# Patient Record
Sex: Male | Born: 1955 | Race: White | Hispanic: No | Marital: Married | Smoking: Former smoker
Health system: Southern US, Community
[De-identification: ages and names within clinical notes are randomized; demographics above are authoritative.]

## PROBLEM LIST (undated history)

## (undated) DIAGNOSIS — I10 Essential (primary) hypertension: Secondary | ICD-10-CM

## (undated) DIAGNOSIS — E785 Hyperlipidemia, unspecified: Secondary | ICD-10-CM

## (undated) DIAGNOSIS — R030 Elevated blood-pressure reading, without diagnosis of hypertension: Secondary | ICD-10-CM

## (undated) DIAGNOSIS — R0600 Dyspnea, unspecified: Secondary | ICD-10-CM

## (undated) DIAGNOSIS — K921 Melena: Secondary | ICD-10-CM

## (undated) DIAGNOSIS — F419 Anxiety disorder, unspecified: Secondary | ICD-10-CM

## (undated) DIAGNOSIS — R7303 Prediabetes: Secondary | ICD-10-CM

## (undated) DIAGNOSIS — U071 COVID-19: Secondary | ICD-10-CM

## (undated) DIAGNOSIS — K760 Fatty (change of) liver, not elsewhere classified: Secondary | ICD-10-CM

## (undated) DIAGNOSIS — R011 Cardiac murmur, unspecified: Secondary | ICD-10-CM

## (undated) HISTORY — DX: Fatty (change of) liver, not elsewhere classified: K76.0

## (undated) HISTORY — DX: COVID-19: U07.1

## (undated) HISTORY — DX: Hyperlipidemia, unspecified: E78.5

## (undated) HISTORY — PX: CARDIAC CATHETERIZATION: SHX172

## (undated) HISTORY — DX: Melena: K92.1

## (undated) HISTORY — DX: Cardiac murmur, unspecified: R01.1

## (undated) HISTORY — PX: OTHER SURGICAL HISTORY: SHX169

## (undated) HISTORY — DX: Elevated blood-pressure reading, without diagnosis of hypertension: R03.0

---

## 2009-07-11 ENCOUNTER — Ambulatory Visit: Payer: Self-pay | Admitting: Internal Medicine

## 2009-07-14 ENCOUNTER — Ambulatory Visit: Payer: Self-pay | Admitting: Internal Medicine

## 2009-11-21 ENCOUNTER — Ambulatory Visit: Payer: Self-pay | Admitting: Gastroenterology

## 2010-06-06 IMAGING — RF DG UGI W/O KUB
1 series · 15 of 24 positions shown · non-contrast
Comparison: No comparison

REASON FOR EXAM: abd pain  heartburn
COMMENTS:

PROCEDURE:     FL  - FL UPPER GI  - July 14, 2009 [DATE]
RESULT:     Indication: Abdominal pain

[Series 1: run · 22 acquisitions, 15 frames shown]
[im 1/22]
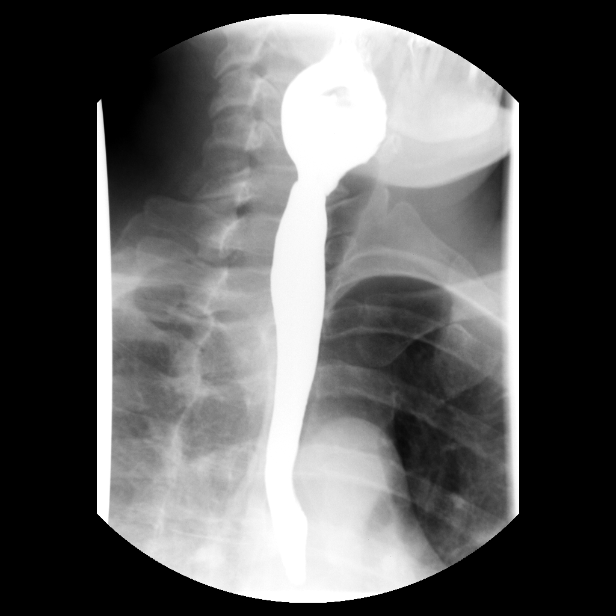
[im 2/22]
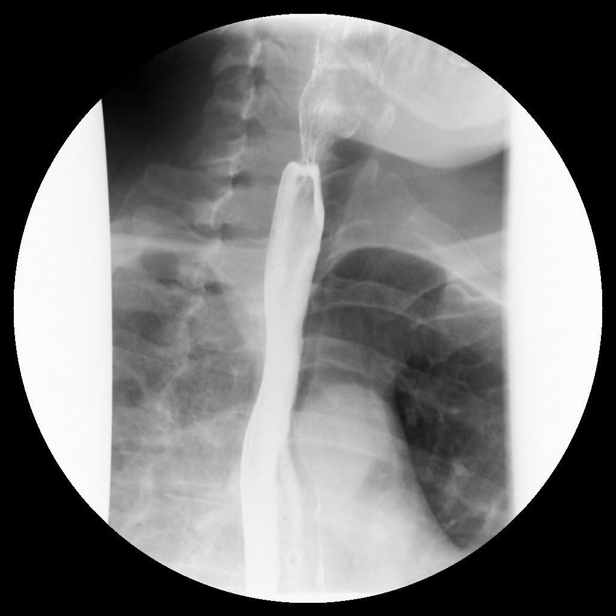
[im 2/22]
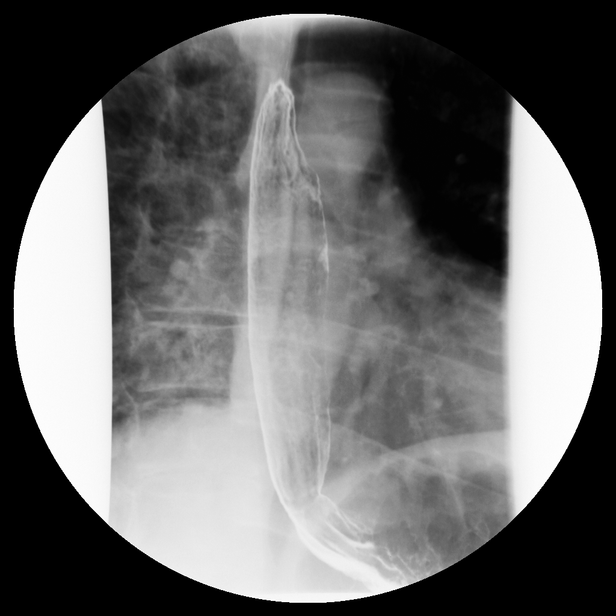
[im 2/22]
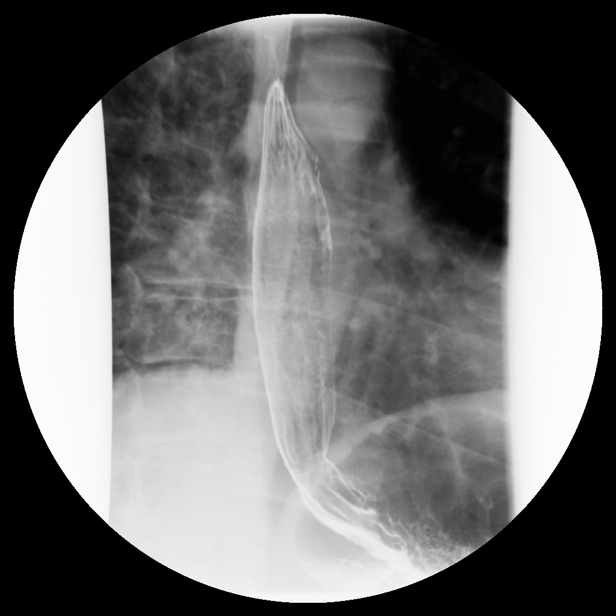
[im 5/22]
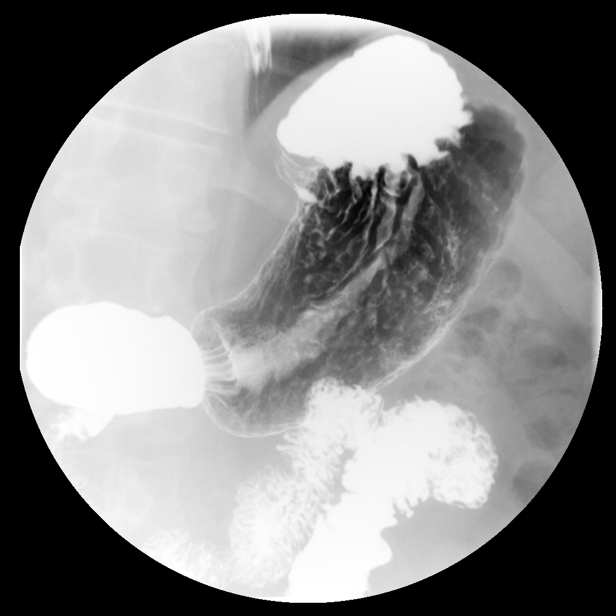
[im 6/22]
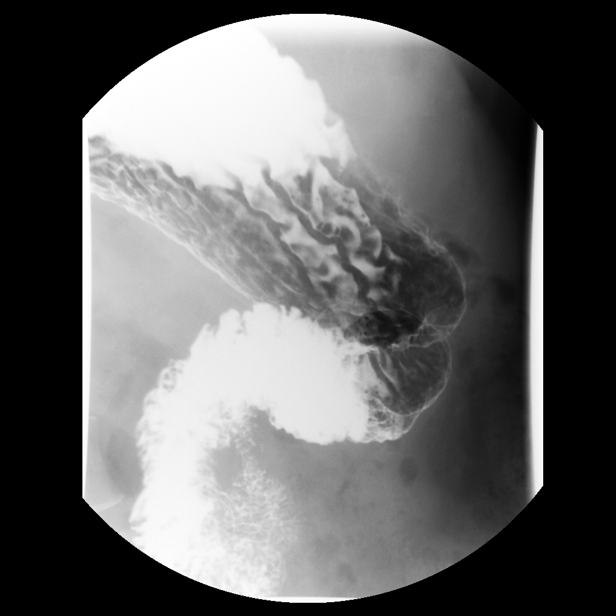
[im 9/22]
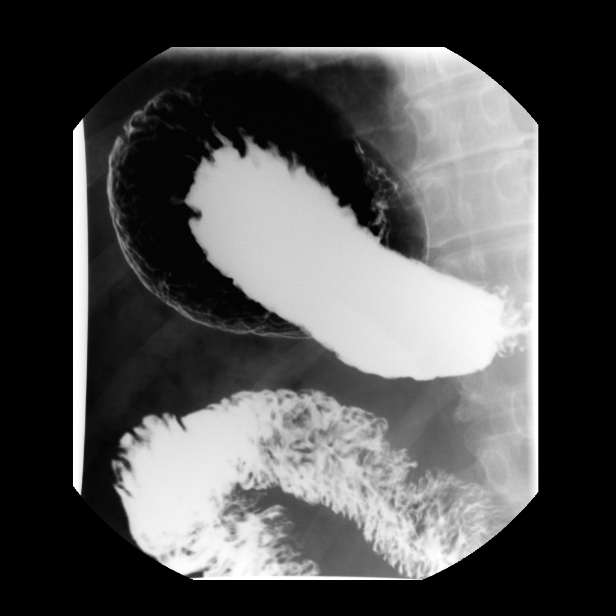
[im 11/22]
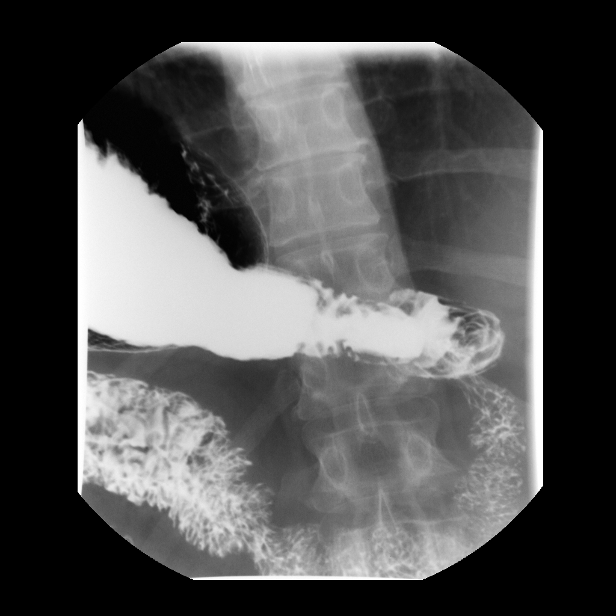
[im 12/22]
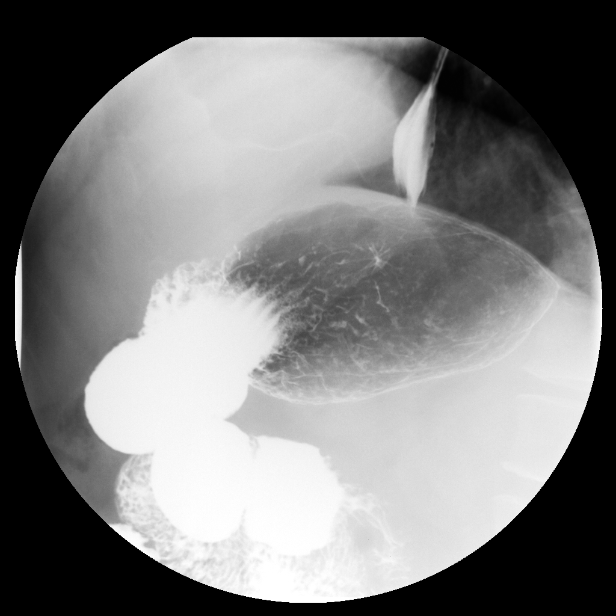
[im 15/22]
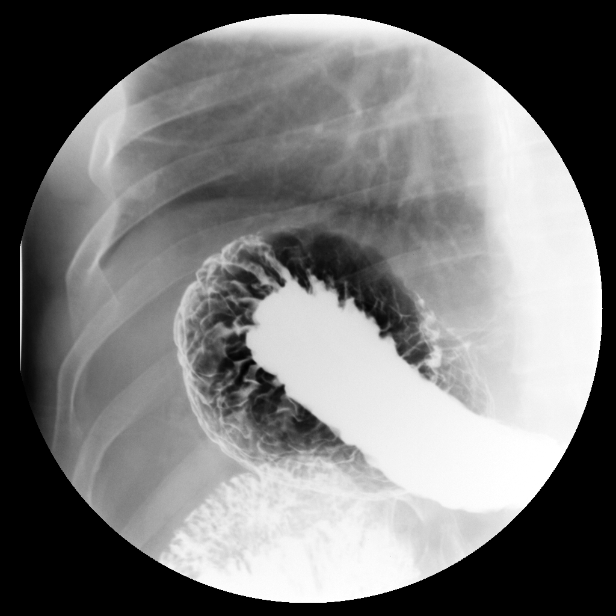
[im 16/22]
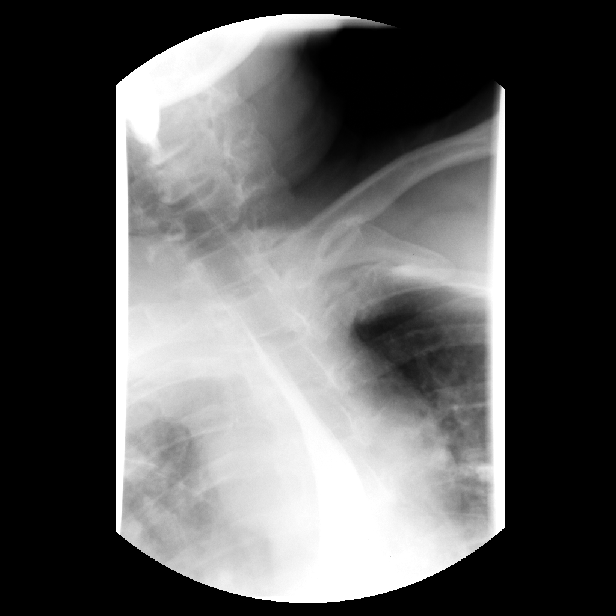
[im 16/22]
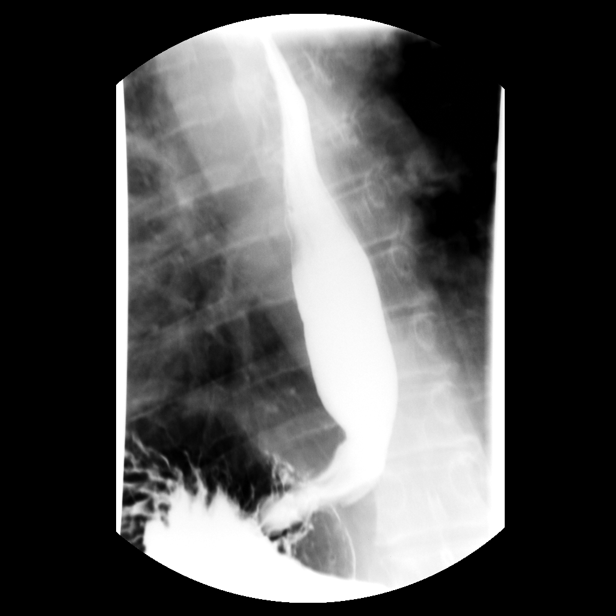
[im 18/22]
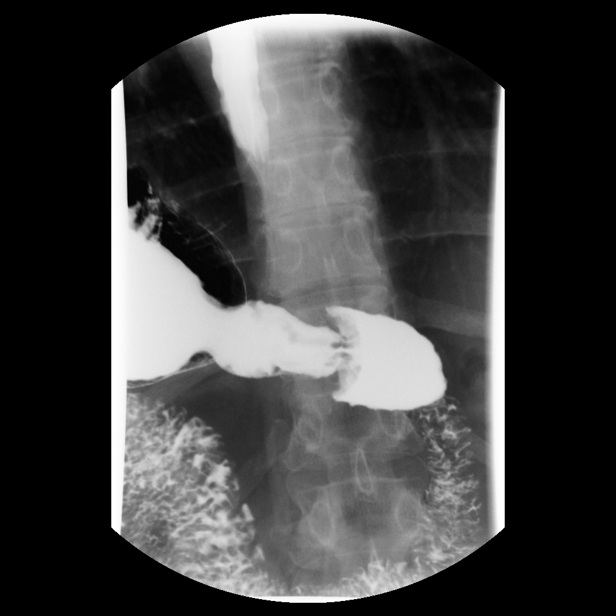
[im 19/22]
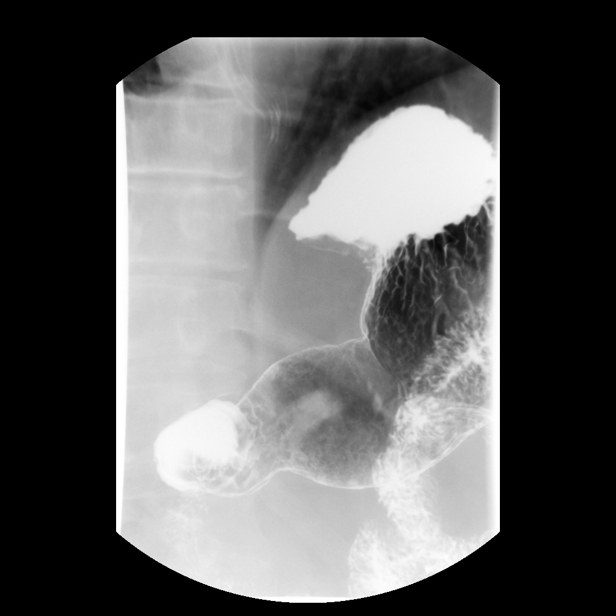
[im 22/22]
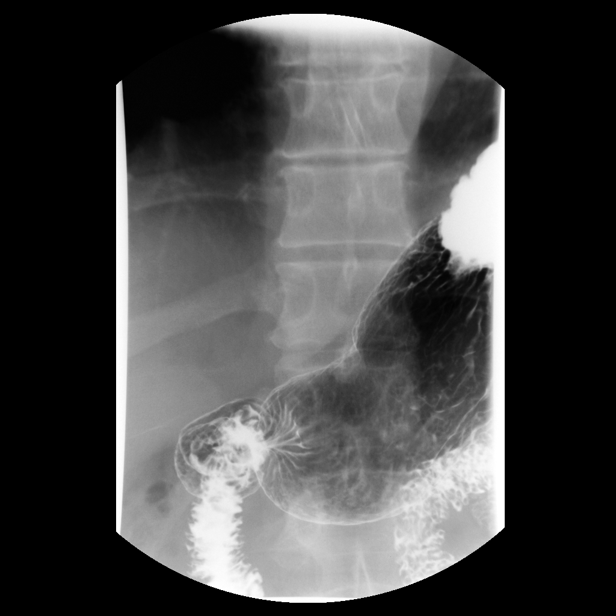

[15 of 24 positions shown; findings below may reference images not displayed]

FINDINGS: Biphasic examination of the esophagus to the distal duodenum was performed
without complication. Total fluoroscopy time was 1.7 minutes.

Examination of the esophagus demonstrated normal esophageal motility. Normal
esophageal morphology without evidence of esophagitis or ulceration. No
esophageal stricture, diverticula, or mass lesion. No evidence of hiatal
hernia. There is mild spontaneous gastroesophageal reflux.

Examination of the stomach demonstrated normal rugal folds and areae
gastricae. The gastric mucosa appeared unremarkable without evidence of
ulceration, scarring, or mass lesion. Gastric motility and emptying was
normal. Fluoroscopic examination of the duodenum demonstrates normal
motility and morphology without evidence of ulceration or mass lesion.
IMPRESSION: 1. Mild spontaneous gastroesophageal reflux.

## 2010-06-06 IMAGING — US ABDOMEN ULTRASOUND
1 series · 17 of 25 positions shown · non-contrast
Comparison: none

REASON FOR EXAM: abd pain  heartburn
COMMENTS:

[Series 1: abdomen ultrasound · 17 of 75 slices shown]
[im 1/75]
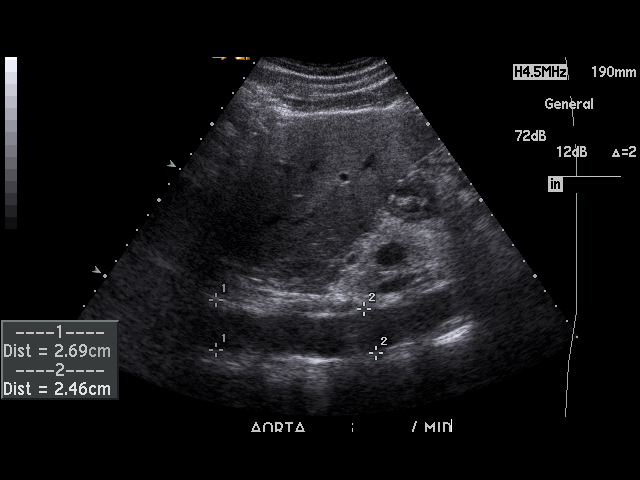
[im 7/75]
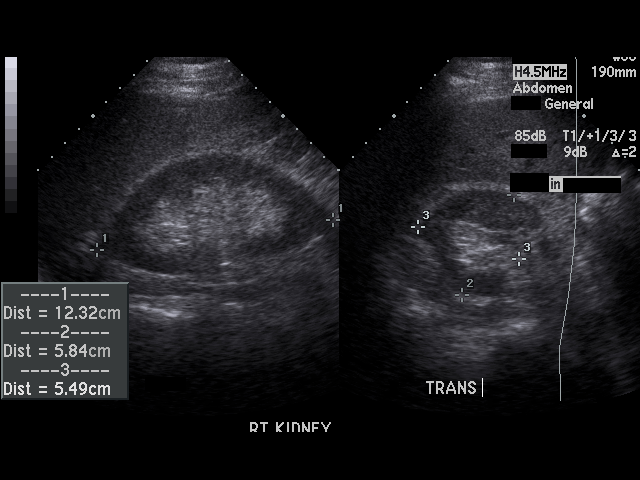
[im 10/75]
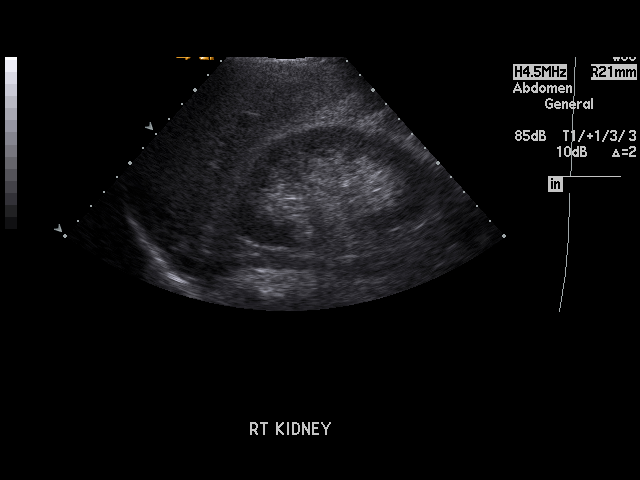
[im 16/75]
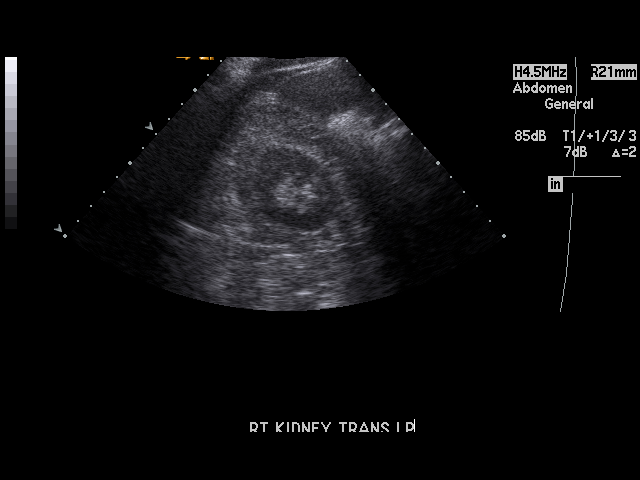
[im 19/75]
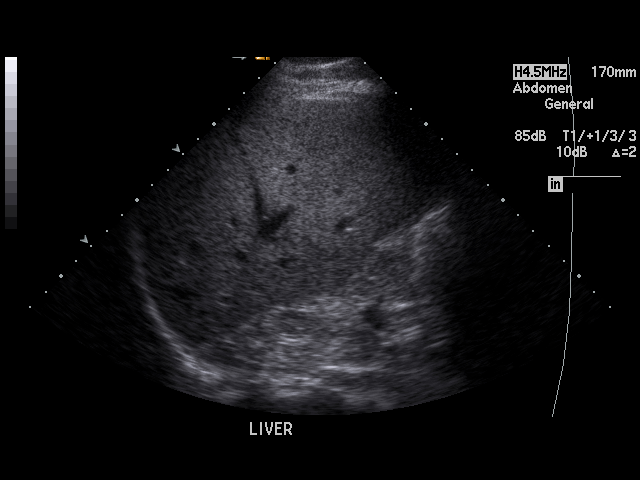
[im 25/75]
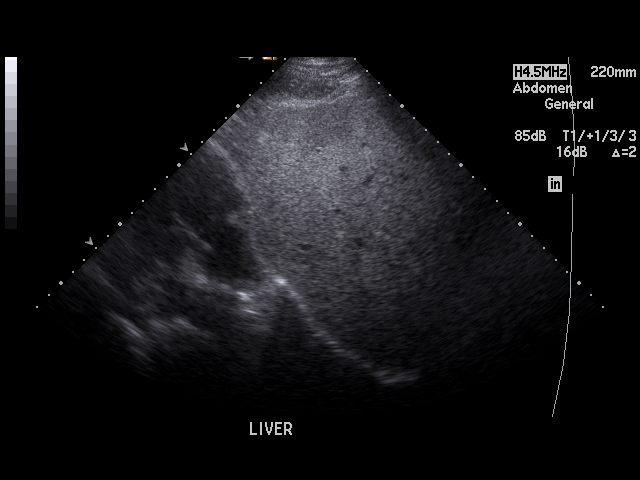
[im 28/75]
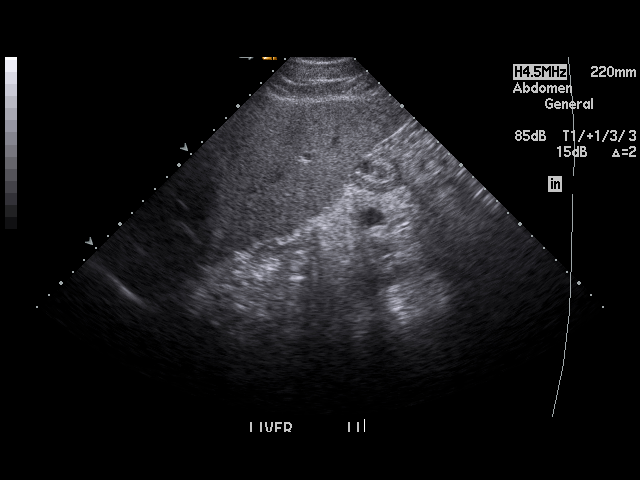
[im 34/75]
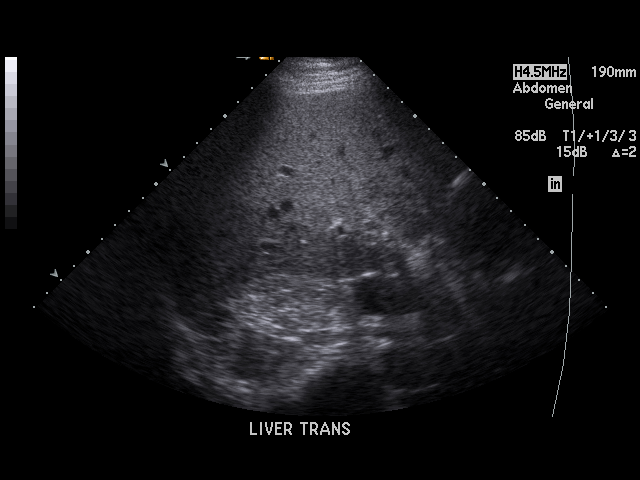
[im 38/75]
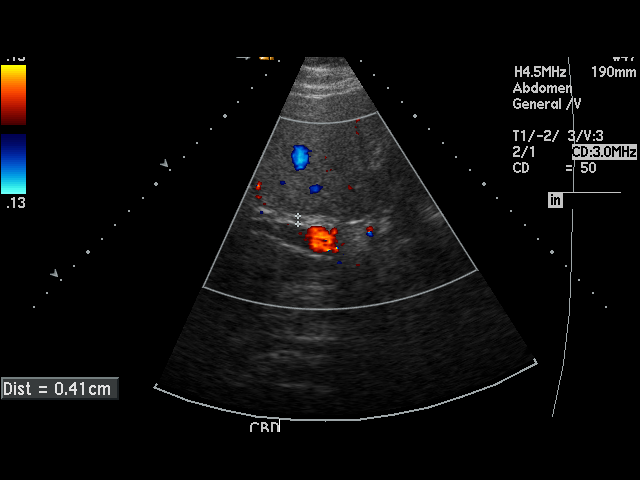
[im 41/75]
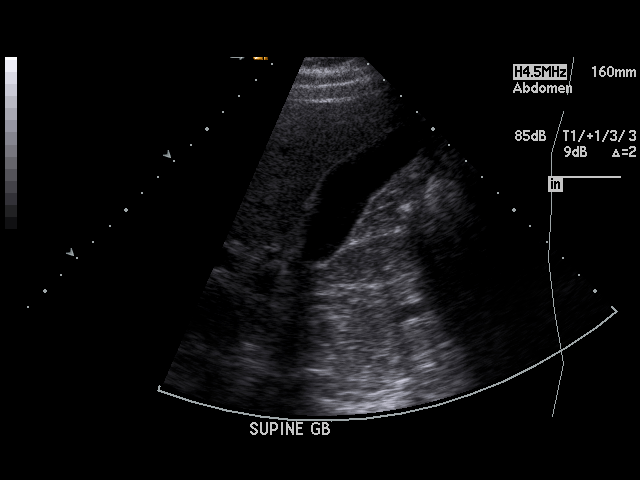
[im 47/75]
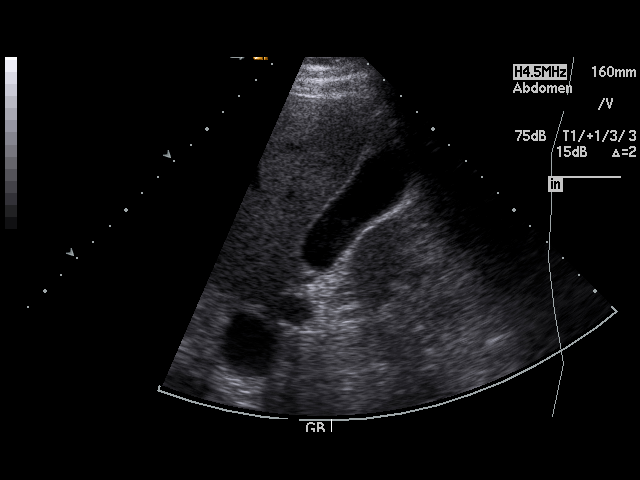
[im 50/75]
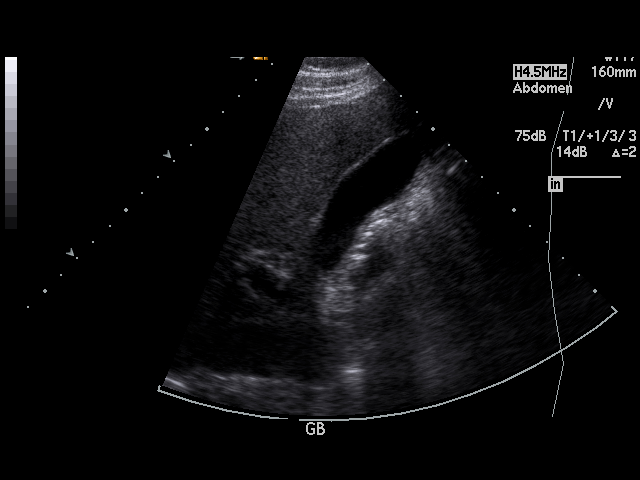
[im 56/75]
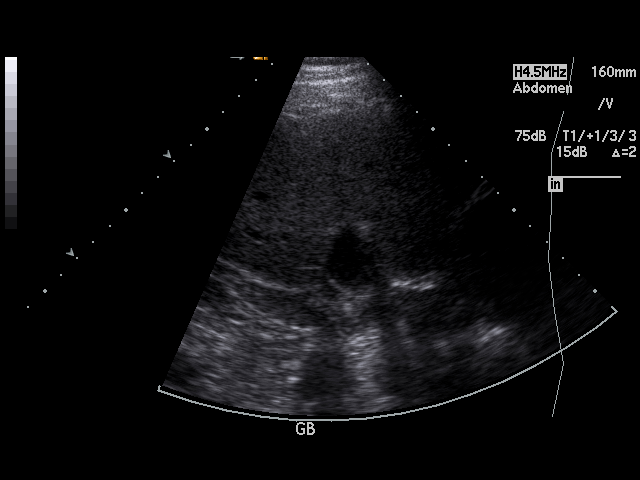
[im 59/75]
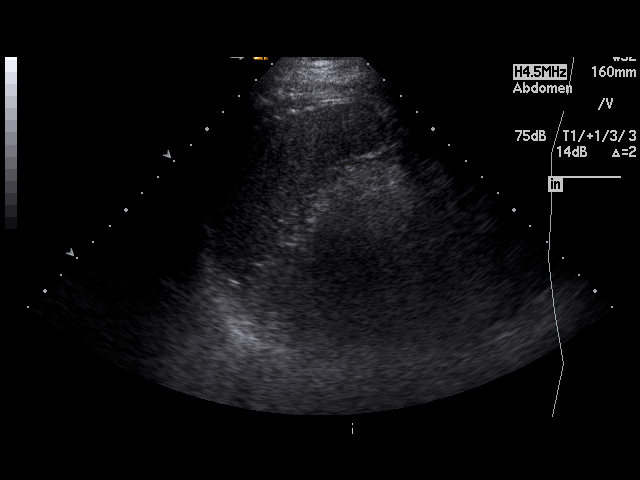
[im 65/75]
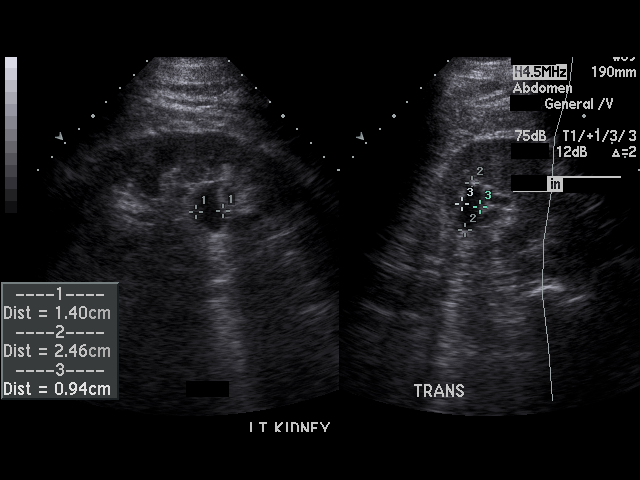
[im 68/75]
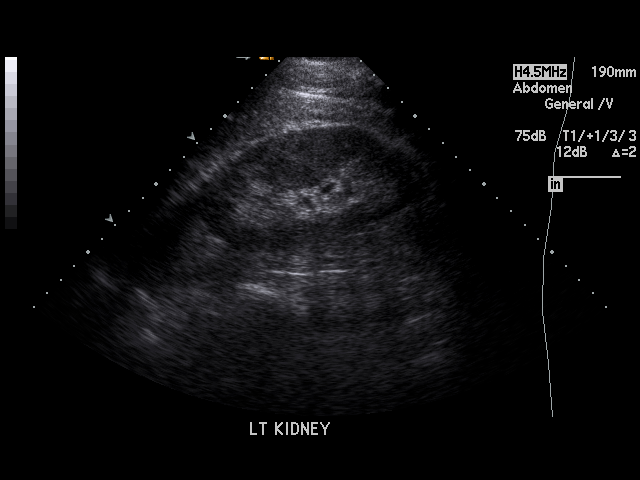
[im 75/75]
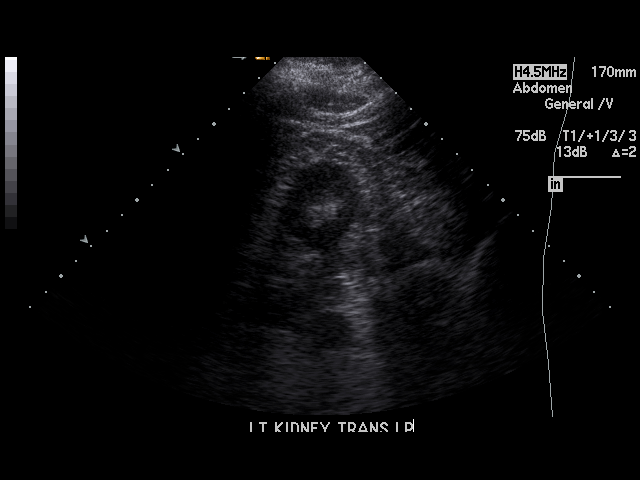

[17 of 25 positions shown; findings below may reference images not displayed]

PROCEDURE:     US  - US ABDOMEN GENERAL SURVEY  - July 14, 2009  [DATE]

RESULT:     No focal hepatic mass lesions are seen. The liver is mildly
hyperechogenic, suspicious for fatty infiltration. Spleen size is normal.
The pancreas, inferior vena cava and abdominal aorta reveal no significant
abnormalities. No gallstones are seen. There is no thickening of the
gallbladder wall. The common bile duct measures 4.1 mm in diameter which is
within normal limits. The kidneys show no hydronephrosis. There is no
ascites.
IMPRESSION: 1. The liver appears mildly hyperechogenic, suspicious for fatty
infiltration.
2. Otherwise normal study.

## 2018-07-10 ENCOUNTER — Telehealth: Payer: Self-pay

## 2018-07-10 ENCOUNTER — Ambulatory Visit (INDEPENDENT_AMBULATORY_CARE_PROVIDER_SITE_OTHER): Payer: BLUE CROSS/BLUE SHIELD | Admitting: Internal Medicine

## 2018-07-10 ENCOUNTER — Encounter: Payer: Self-pay | Admitting: Internal Medicine

## 2018-07-10 ENCOUNTER — Ambulatory Visit
Admission: RE | Admit: 2018-07-10 | Discharge: 2018-07-10 | Disposition: A | Discharge status: Home / Self Care | Payer: BLUE CROSS/BLUE SHIELD | Source: Ambulatory Visit | Attending: Internal Medicine | Admitting: Internal Medicine

## 2018-07-10 VITALS — BP 138/100 | HR 89 | Temp 98.2°F | Resp 18 | Ht 69.5 in | Wt 211.5 lb

## 2018-07-10 DIAGNOSIS — I700 Atherosclerosis of aorta: Secondary | ICD-10-CM | POA: Insufficient documentation

## 2018-07-10 DIAGNOSIS — R1013 Epigastric pain: Secondary | ICD-10-CM | POA: Insufficient documentation

## 2018-07-10 DIAGNOSIS — Z125 Encounter for screening for malignant neoplasm of prostate: Secondary | ICD-10-CM | POA: Diagnosis not present

## 2018-07-10 DIAGNOSIS — E559 Vitamin D deficiency, unspecified: Secondary | ICD-10-CM

## 2018-07-10 DIAGNOSIS — K760 Fatty (change of) liver, not elsewhere classified: Secondary | ICD-10-CM

## 2018-07-10 DIAGNOSIS — R1033 Periumbilical pain: Secondary | ICD-10-CM | POA: Insufficient documentation

## 2018-07-10 DIAGNOSIS — K429 Umbilical hernia without obstruction or gangrene: Secondary | ICD-10-CM | POA: Insufficient documentation

## 2018-07-10 DIAGNOSIS — Z1329 Encounter for screening for other suspected endocrine disorder: Secondary | ICD-10-CM | POA: Diagnosis not present

## 2018-07-10 DIAGNOSIS — R011 Cardiac murmur, unspecified: Secondary | ICD-10-CM

## 2018-07-10 DIAGNOSIS — E785 Hyperlipidemia, unspecified: Secondary | ICD-10-CM | POA: Insufficient documentation

## 2018-07-10 HISTORY — DX: Essential (primary) hypertension: I10

## 2018-07-10 LAB — CBC WITH DIFFERENTIAL/PLATELET
BASOS PCT: 0.5 % (ref 0.0–3.0)
Basophils Absolute: 0 10*3/uL (ref 0.0–0.1)
EOS ABS: 0.2 10*3/uL (ref 0.0–0.7)
EOS PCT: 4.1 % (ref 0.0–5.0)
HEMATOCRIT: 48.5 % (ref 39.0–52.0)
HEMOGLOBIN: 16.5 g/dL (ref 13.0–17.0)
LYMPHS PCT: 45.4 % (ref 12.0–46.0)
Lymphs Abs: 2.6 10*3/uL (ref 0.7–4.0)
MCHC: 34.1 g/dL (ref 30.0–36.0)
MCV: 89.6 fl (ref 78.0–100.0)
Monocytes Absolute: 0.6 10*3/uL (ref 0.1–1.0)
Monocytes Relative: 9.7 % (ref 3.0–12.0)
NEUTROS ABS: 2.3 10*3/uL (ref 1.4–7.7)
Neutrophils Relative %: 40.3 % — ABNORMAL LOW (ref 43.0–77.0)
PLATELETS: 240 10*3/uL (ref 150.0–400.0)
RBC: 5.41 Mil/uL (ref 4.22–5.81)
RDW: 13.6 % (ref 11.5–15.5)
WBC: 5.8 10*3/uL (ref 4.0–10.5)

## 2018-07-10 LAB — COMPREHENSIVE METABOLIC PANEL
ALT: 25 U/L (ref 0–53)
AST: 17 U/L (ref 0–37)
Albumin: 4.5 g/dL (ref 3.5–5.2)
Alkaline Phosphatase: 59 U/L (ref 39–117)
BUN: 26 mg/dL — ABNORMAL HIGH (ref 6–23)
CALCIUM: 9.7 mg/dL (ref 8.4–10.5)
CO2: 31 meq/L (ref 19–32)
CREATININE: 1.11 mg/dL (ref 0.40–1.50)
Chloride: 101 mEq/L (ref 96–112)
GFR: 71.67 mL/min (ref >60.00)
Glucose, Bld: 102 mg/dL — ABNORMAL HIGH (ref 70–99)
POTASSIUM: 4.3 meq/L (ref 3.5–5.1)
SODIUM: 138 meq/L (ref 135–145)
Total Bilirubin: 0.4 mg/dL (ref 0.2–1.2)
Total Protein: 7.2 g/dL (ref 6.0–8.3)

## 2018-07-10 LAB — TSH: TSH: 0.99 u[IU]/mL (ref 0.35–4.50)

## 2018-07-10 LAB — VITAMIN D 25 HYDROXY (VIT D DEFICIENCY, FRACTURES): VITD: 26.3 ng/mL — ABNORMAL LOW (ref 30.00–100.00)

## 2018-07-10 LAB — PSA: PSA: 1.22 ng/mL (ref 0.10–4.00)

## 2018-07-10 MED ORDER — IOHEXOL 300 MG/ML  SOLN
100.0000 mL | Freq: Once | INTRAMUSCULAR | Status: AC | PRN
  Administered 2018-07-10: 100 mL via INTRAVENOUS

## 2018-07-10 NOTE — Patient Instructions (Addendum)
Umbilical Hernia, Adult A hernia is a bulge of tissue that pushes through an opening between muscles. An umbilical hernia happens in the abdomen, near the belly button (umbilicus). The hernia may contain tissues from the small intestine, large intestine, or fatty tissue covering the intestines (omentum). Umbilical hernias in adults tend to get worse over time, and they require surgical treatment. There are several types of umbilical hernias. You may have:  A hernia located just above or below the umbilicus (indirect hernia). This is the most common type of umbilical hernia in adults.  A hernia that forms through an opening formed by the umbilicus (direct hernia).  A hernia that comes and goes (reducible hernia). A reducible hernia may be visible only when you strain, lift something heavy, or cough. This type of hernia can be pushed back into the abdomen (reduced).  A hernia that traps abdominal tissue inside the hernia (incarcerated hernia). This type of hernia cannot be reduced.  A hernia that cuts off blood flow to the tissues inside the hernia (strangulated hernia). The tissues can start to die if this happens. This type of hernia requires emergency treatment.  What are the causes? An umbilical hernia happens when tissue inside the abdomen presses on a weak area of the abdominal muscles. What increases the risk? You may have a greater risk of this condition if you:  Are obese.  Have had several pregnancies.  Have a buildup of fluid inside your abdomen (ascites).  Have had surgery that weakens the abdominal muscles.  What are the signs or symptoms? The main symptom of this condition is a painless bulge at or near the belly button. A reducible hernia may be visible only when you strain, lift something heavy, or cough. Other symptoms may include:  Dull pain.  A feeling of pressure.  Symptoms of a strangulated hernia may include:  Pain that gets increasingly worse.  Nausea and  vomiting.  Pain when pressing on the hernia.  Skin over the hernia becoming red or purple.  Constipation.  Blood in the stool.  How is this diagnosed? This condition may be diagnosed based on:  A physical exam. You may be asked to cough or strain while standing. These actions increase the pressure inside your abdomen and force the hernia through the opening in your muscles. Your health care provider may try to reduce the hernia by pressing on it.  Your symptoms and medical history.  How is this treated? Surgery is the only treatment for an umbilical hernia. Surgery for a strangulated hernia is done as soon as possible. If you have a small hernia that is not incarcerated, you may need to lose weight before having surgery. Follow these instructions at home:  Lose weight, if told by your health care provider.  Do not try to push the hernia back in.  Watch your hernia for any changes in color or size. Tell your health care provider if any changes occur.  You may need to avoid activities that increase pressure on your hernia.  Do not lift anything that is heavier than 10 lb (4.5 kg) until your health care provider says that this is safe.  Take over-the-counter and prescription medicines only as told by your health care provider.  Keep all follow-up visits as told by your health care provider. This is important. Contact a health care provider if:  Your hernia gets larger.  Your hernia becomes painful. Get help right away if:  You develop sudden, severe pain near the  area of your hernia.  You have pain as well as nausea or vomiting.  You have pain and the skin over your hernia changes color.  You develop a fever. This information is not intended to replace advice given to you by your health care provider. Make sure you discuss any questions you have with your health care provider. Document Released: 01/20/2016 Document Revised: 04/22/2016 Document Reviewed:  01/20/2016 Elsevier Interactive Patient Education  2018 [employer].  Tdap/DTaP Vaccine (Diphtheria, Tetanus, and Pertussis): What You Need to Know 1. Why get vaccinated? Diphtheria, tetanus, and pertussis are serious diseases caused by bacteria. Diphtheria and pertussis are spread from person to person. Tetanus enters the body through cuts or wounds. DIPHTHERIA causes a thick covering in the back of the throat.  It can lead to breathing problems, paralysis, heart failure, and even death.  TETANUS (Lockjaw) causes painful tightening of the muscles, usually all over the body.  It can lead to "locking" of the jaw so the victim cannot open his mouth or swallow. Tetanus leads to death in up to 2 out of 10 cases.  PERTUSSIS (Whooping Cough) causes coughing spells so bad that it is hard for infants to eat, drink, or breathe. These spells can last for weeks.  It can lead to pneumonia, seizures (jerking and staring spells), brain damage, and death.  Diphtheria, tetanus, and pertussis vaccine (DTaP) can help prevent these diseases. Most children who are vaccinated with DTaP will be protected throughout childhood. Many more children would get these diseases if we stopped vaccinating. DTaP is a safer version of an older vaccine called DTP. DTP is no longer used in the [place]. 2. Who should get DTaP vaccine and when? Children should get 5 doses of DTaP vaccine, one dose at each of the following ages:  2 months  4 months  6 months  15-18 months  4-6 years  DTaP may be given at the same time as other vaccines. 3. Some children should not get DTaP vaccine or should wait  Children with minor illnesses, such as a cold, may be vaccinated. But children who are moderately or severely ill should usually wait until they recover before getting DTaP vaccine.  Any child who had a life-threatening allergic reaction after a dose of DTaP should not get another dose.  Any child who suffered a  brain or nervous system disease within 7 days after a dose of DTaP should not get another dose.  Talk with your doctor if your child: ? had a seizure or collapsed after a dose of DTaP, ? cried non-stop for 3 hours or more after a dose of DTaP, ? had a fever over 105F after a dose of DTaP. Ask your doctor for more information. Some of these children should not get another dose of pertussis vaccine, but may get a vaccine without pertussis, called DT. 4. Older children and adults DTaP is not licensed for adolescents, adults, or children 21 years of age and older. But older people still need protection. A vaccine called Tdap is similar to DTaP. A single dose of Tdap is recommended for people 11 through 60 years of age. Another vaccine, called Td, protects against tetanus and diphtheria, but not pertussis. It is recommended every 10 years. There are separate Vaccine Information Statements for these vaccines. 5. What are the risks from DTaP vaccine? Getting diphtheria, tetanus, or pertussis disease is much riskier than getting DTaP vaccine. However, a vaccine, like any medicine, is capable of causing serious problems,  such as severe allergic reactions. The risk of DTaP vaccine causing serious harm, or death, is extremely small. Mild problems (common)  Fever (up to about 1 child in 4)  Redness or swelling where the shot was given (up to about 1 child in 4)  Soreness or tenderness where the shot was given (up to about 1 child in 4) These problems occur more often after the 4th and 5th doses of the DTaP series than after earlier doses. Sometimes the 4th or 5th dose of DTaP vaccine is followed by swelling of the entire arm or leg in which the shot was given, lasting 1-7 days (up to about 1 child in 30). Other mild problems include:  Fussiness (up to about 1 child in 3)  Tiredness or poor appetite (up to about 1 child in 10)  Vomiting (up to about 1 child in 50) These problems generally occur 1-3  days after the shot. Moderate problems (uncommon)  Seizure (jerking or staring) (about 1 child out of 14,000)  Non-stop crying, for 3 hours or more (up to about 1 child out of 1,000)  High fever, over 105F (about 1 child out of 16,000) Severe problems (very rare)  Serious allergic reaction (less than 1 out of a million doses)  Several other severe problems have been reported after DTaP vaccine. These include: ? Long-term seizures, coma, or lowered consciousness ? Permanent brain damage. These are so rare it is hard to tell if they are caused by the vaccine. Controlling fever is especially important for children who have had seizures, for any reason. It is also important if another family member has had seizures. You can reduce fever and pain by giving your child an aspirin-free pain reliever when the shot is given, and for the next 24 hours, following the package instructions. 6. What if there is a serious reaction? What should I look for? Look for anything that concerns you, such as signs of a severe allergic reaction, very high fever, or behavior changes. Signs of a severe allergic reaction can include hives, swelling of the face and throat, difficulty breathing, a fast heartbeat, dizziness, and weakness. These would start a few minutes to a few hours after the vaccination. What should I do?  If you think it is a severe allergic reaction or other emergency that can't wait, call 9-1-1 or get the person to the nearest hospital. Otherwise, call your doctor.  Afterward, the reaction should be reported to the Vaccine Adverse Event Reporting System (VAERS). Your doctor might file this report, or you can do it yourself through the VAERS web site at www.vaers.LAgents.no, or by calling 1-(650) 303-1194. ? VAERS is only for reporting reactions. They do not give medical advice. 7. The National Vaccine Injury Compensation Program The [employer] Vaccine Injury Compensation Program (VICP) is a federal  program that was created to compensate people who may have been injured by certain vaccines. Persons who believe they may have been injured by a vaccine can learn about the program and about filing a claim by calling 1-301-729-9318 or visiting the VICP website at SpiritualWord.at. 8. How can I learn more?  Ask your doctor.  Call your local or state health department.  Contact the Centers for Disease Control and Prevention (CDC): ? Call 5616628488 (1-800-CDC-INFO) or ? Visit CDC's website at PicCapture.uy CDC DTaP Vaccine (Diphtheria, Tetanus, and Pertussis) VIS (01/17/06) This information is not intended to replace advice given to you by your health care provider. Make sure you discuss any questions you  have with your health care provider. Document Released: 06/17/2006 Document Revised: 05/10/2016 Document Reviewed: 05/10/2016 Elsevier Interactive Patient Education  2017 Elsevier Inc.   Nonalcoholic Fatty Liver Disease Diet Nonalcoholic fatty liver disease is a condition that causes fat to accumulate in and around the liver. The disease makes it harder for the liver to work the way that it should. Following a healthy diet can help to keep nonalcoholic fatty liver disease under control. It can also help to prevent or improve conditions that are associated with the disease, such as heart disease, diabetes, high blood pressure, and abnormal cholesterol levels. Along with regular exercise, this diet:  Promotes weight loss.  Helps to control blood sugar levels.  Helps to improve the way that the body uses insulin.  What do I need to know about this diet?  Use the glycemic index (GI) to plan your meals. The index tells you how quickly a food will raise your blood sugar. Choose low-GI foods. These foods take a longer time to raise blood sugar.  Keep track of how many calories you take in. Eating the right amount of calories will help you to achieve a healthy  weight.  You may want to follow a Mediterranean diet. This diet includes a lot of vegetables, lean meats or fish, whole grains, fruits, and healthy oils and fats. What foods can I eat? Grains Whole grains, such as whole-wheat or whole-grain breads, crackers, tortillas, cereals, and pasta. Stone-ground whole wheat. Pumpernickel bread. Unsweetened oatmeal. Bulgur. Barley. Quinoa. Brown or wild rice. Corn or whole-wheat flour tortillas. Vegetables Lettuce. Spinach. Peas. Beets. Cauliflower. Cabbage. Broccoli. Carrots. Tomatoes. Squash. Eggplant. Herbs. Peppers. Onions. Cucumbers. Brussels sprouts. Yams and sweet potatoes. Beans. Lentils. Fruits Bananas. Apples. Oranges. Grapes. Papaya. Mango. Pomegranate. Kiwi. Grapefruit. Cherries. Meats and Other Protein Sources Seafood and shellfish. Lean meats. Poultry. Tofu. Dairy Low-fat or fat-free dairy products, such as yogurt, cottage cheese, and cheese. Beverages Water. Sugar-free drinks. Tea. Coffee. Low-fat or skim milk. Milk alternatives, such as soy or almond milk. Real fruit juice. Condiments Mustard. Relish. Low-fat, low-sugar ketchup and barbecue sauce. Low-fat or fat-free mayonnaise. Sweets and Desserts Sugar-free sweets. Fats and Oils Avocado. Canola or olive oil. Nuts and nut butters. Seeds. The items listed above may not be a complete list of recommended foods or beverages. Contact your dietitian for more options. What foods are not recommended? Palm oil and coconut oil. Processed foods. Fried foods. Sweetened drinks, such as sweet tea, milkshakes, snow cones, iced sweet drinks, and sodas. Alcohol. Sweets. Foods that contain a lot of salt or sodium. The items listed above may not be a complete list of foods and beverages to avoid. Contact your dietitian for more information. This information is not intended to replace advice given to you by your health care provider. Make sure you discuss any questions you have with your health care  provider. Document Released: 01/04/2015 Document Revised: 01/26/2016 Document Reviewed: 09/14/2014 Elsevier Interactive Patient Education  2018 Elsevier Inc.  Fatty Liver Fatty liver, also called hepatic steatosis or steatohepatitis, is a condition in which too much fat has built up in your liver cells. The liver removes harmful substances from your bloodstream. It produces fluids your body needs. It also helps your body use and store energy from the food you eat. In many cases, fatty liver does not cause symptoms or problems. It is often diagnosed when tests are being done for other reasons. However, over time, fatty liver can cause inflammation that may lead to more serious  liver problems, such as scarring of the liver (cirrhosis). What are the causes? Causes of fatty liver may include:  Drinking too much alcohol.  Poor nutrition.  Obesity.  Cushing syndrome.  Diabetes.  Hyperlipidemia.  Pregnancy.  Certain drugs.  Poisons.  Some viral infections.  What increases the risk? You may be more likely to develop fatty liver if you:  Abuse alcohol.  Are pregnant.  Are overweight.  Have diabetes.  Have hepatitis.  Have a high triglyceride level.  What are the signs or symptoms? Fatty liver often does not cause any symptoms. In cases where symptoms develop, they can include:  Fatigue.  Weakness.  Weight loss.  Confusion.  Abdominal pain.  Yellowing of your skin and the white parts of your eyes (jaundice).  Nausea and vomiting.  How is this diagnosed? Fatty liver may be diagnosed by:  Physical exam and medical history.  Blood tests.  Imaging tests, such as an ultrasound, CT scan, or MRI.  Liver biopsy. A small sample of liver tissue is removed using a needle. The sample is then looked at under a microscope.  How is this treated? Fatty liver is often caused by other health conditions. Treatment for fatty liver may involve medicines and lifestyle  changes to manage conditions such as:  Alcoholism.  High cholesterol.  Diabetes.  Being overweight or obese.  Follow these instructions at home:  Eat a healthy diet as directed by your health care provider.  Exercise regularly. This can help you lose weight and control your cholesterol and diabetes. Talk to your health care provider about an exercise plan and which activities are best for you.  Do not drink alcohol.  Take medicines only as directed by your health care provider. Contact a health care provider if: You have difficulty controlling your:  Blood sugar.  Cholesterol.  Alcohol consumption.  Get help right away if:  You have abdominal pain.  You have jaundice.  You have nausea and vomiting. This information is not intended to replace advice given to you by your health care provider. Make sure you discuss any questions you have with your health care provider. Document Released: 10/05/2005 Document Revised: 01/26/2016 Document Reviewed: 12/30/2013 Elsevier Interactive Patient Education  [employer].

## 2018-07-10 NOTE — Progress Notes (Addendum)
Chief Complaint  Patient presents with  . Establish Care   New patient  1. HLD unable to tolerate statins due to MSK pain not seen MD since 2010  2. C/o abdominal pain 7/10 at umbilical region x 4 months last episode Tues before visit he lies down and helps. Also c/o x 5 years blood in stool pain lasted 2-3 hrs nothing tried pain was extreme and belly felt tight and increased saliva w/o vomiting, no constipation or diarrhea   Review of Systems  Constitutional: Negative for weight loss.  HENT: Negative for hearing loss.   Eyes: Negative for blurred vision.  Respiratory: Negative for shortness of breath.   Cardiovascular: Negative for chest pain.  Gastrointestinal: Positive for abdominal pain and blood in stool. Negative for nausea and vomiting.  Musculoskeletal: Negative for falls.  Skin: Negative for rash.  Neurological: Negative for headaches.  Psychiatric/Behavioral: Negative for depression.   Past Medical History:  Diagnosis Date  . Blood in stool    on and off since 2014   . Elevated blood pressure reading   . Fatty liver   . Heart murmur   . Hyperlipidemia    Past Surgical History:  Procedure Laterality Date  . left knee surgery     Family History  Family history unknown: Yes   Social History   Socioeconomic History  . Marital status: Married    Spouse name: Not on file  . Number of children: Not on file  . Years of education: Not on file  . Highest education level: Not on file  Occupational History  . Not on file  Social Needs  . Financial resource strain: Not on file  . Food insecurity:    Worry: Not on file    Inability: Not on file  . Transportation needs:    Medical: Not on file    Non-medical: Not on file  Tobacco Use  . Smoking status: Former Games developer  . Smokeless tobacco: Never Used  . Tobacco comment: light   Substance and Sexual Activity  . Alcohol use: Yes    Comment: occ  . Drug use: Not Currently  . Sexual activity: Yes  Lifestyle  .  Physical activity:    Days per week: Not on file    Minutes per session: Not on file  . Stress: Not on file  Relationships  . Social connections:    Talks on phone: Not on file    Gets together: Not on file    Attends religious service: Not on file    Active member of club or organization: Not on file    Attends meetings of clubs or organizations: Not on file    Relationship status: Not on file  . Intimate partner violence:    Fear of current or ex partner: Not on file    Emotionally abused: Not on file    Physically abused: Not on file    Forced sexual activity: Not on file  Other Topics Concern  . Not on file  Social History Narrative   Married self employed Tree surgeon    Former smoker in 20s-30s light    No guns    Wears seat belt, safe in relationship   College ed, 2 kids    No outpatient medications have been marked as taking for the 07/10/18 encounter (Office Visit) with McLean-Scocuzza, Pasty Spillers, MD.   Allergies  Allergen Reactions  . Statins Other (See Comments)   No results found for this or any previous visit (from  the past 2160 hour(s)). Objective  Body mass index is 30.79 kg/m. Wt Readings from Last 3 Encounters:  07/10/18 211 lb 8 oz (95.9 kg)   Temp Readings from Last 3 Encounters:  07/10/18 98.2 F (36.8 C) (Oral)   BP Readings from Last 3 Encounters:  07/10/18 (!) 138/100   Pulse Readings from Last 3 Encounters:  07/10/18 89    Physical Exam  Constitutional: He is oriented to person, place, and time. Vital signs are normal. He appears well-developed and well-nourished. He is cooperative.  HENT:  Head: Normocephalic and atraumatic.  Mouth/Throat: Oropharynx is clear and moist and mucous membranes are normal.  Eyes: Pupils are equal, round, and reactive to light. Conjunctivae are normal.  Cardiovascular: Normal rate and regular rhythm.  Murmur heard. Pulmonary/Chest: Effort normal and breath sounds normal.  Abdominal: There is no tenderness. A hernia  is present.    Neurological: He is alert and oriented to person, place, and time. Gait normal.  Skin: Skin is warm, dry and intact.  Psychiatric: He has a normal mood and affect. His speech is normal and behavior is normal. Judgment and thought content normal. Cognition and memory are normal.  Nursing note and vitals reviewed.   Assessment   1. HLD  2. Ab pain umbilical with hernia h/o fatty liver  3. HM 4. Cardiac murmur  Plan   1. Check lipid in future  2. CT ab/pelvis  Labs  3.  Declines flu shot  rec Tdap vaccine  Disc shingrix at f/u   Check PSA Will likely need repeat colonoscopy get records [facility], labs stress test, notes Elevated BP today monitor recheck at f/u  4. Consider repeat echo   [facility] no pt by this name and dob received   Provider: Dr. French Ana McLean-Scocuzza-[facility]

## 2018-07-11 ENCOUNTER — Other Ambulatory Visit: Payer: Self-pay | Admitting: Internal Medicine

## 2018-07-11 ENCOUNTER — Telehealth: Payer: Self-pay

## 2018-07-11 DIAGNOSIS — K429 Umbilical hernia without obstruction or gangrene: Secondary | ICD-10-CM

## 2018-07-11 LAB — URINALYSIS, ROUTINE W REFLEX MICROSCOPIC
Bilirubin Urine: NEGATIVE
Glucose, UA: NEGATIVE
Hgb urine dipstick: NEGATIVE
KETONES UR: NEGATIVE
LEUKOCYTES UA: NEGATIVE
NITRITE: NEGATIVE
PH: 7.5 (ref 5.0–8.0)
Protein, ur: NEGATIVE
SPECIFIC GRAVITY, URINE: 1.02 (ref 1.001–1.03)

## 2018-07-11 LAB — HEPATITIS B SURFACE ANTIGEN: Hepatitis B Surface Ag: NONREACTIVE

## 2018-07-11 LAB — HEPATITIS C ANTIBODY
Hepatitis C Ab: NONREACTIVE
SIGNAL TO CUT-OFF: 0.04 (ref <1.00)

## 2018-07-11 LAB — HEPATITIS B SURFACE ANTIBODY, QUANTITATIVE

## 2018-07-11 LAB — HEPATITIS A ANTIBODY, TOTAL: Hepatitis A AB,Total: NONREACTIVE

## 2018-07-11 NOTE — Telephone Encounter (Signed)
Copied from CRM [id number]. Topic: General - Other >> Jul 11, 2018  8:42 AM [name] wrote: Reason for CRM: Pt called in wanting to know if someone could reach out to him regarding his results from his CT scan.  Please advise  *I know that [name], CMA has already talked to patient, but patient has requested that she call to inform his wife.*

## 2018-07-11 NOTE — Telephone Encounter (Signed)
Called and spoke with wife in regrards to CT results .

## 2018-07-11 NOTE — Telephone Encounter (Signed)
Error

## 2018-07-17 ENCOUNTER — Ambulatory Visit (INDEPENDENT_AMBULATORY_CARE_PROVIDER_SITE_OTHER): Payer: BLUE CROSS/BLUE SHIELD | Admitting: General Surgery

## 2018-07-17 ENCOUNTER — Other Ambulatory Visit: Payer: Self-pay

## 2018-07-17 ENCOUNTER — Encounter: Payer: Self-pay | Admitting: General Surgery

## 2018-07-17 VITALS — BP 139/91 | HR 67 | Temp 97.8°F | Ht 69.0 in | Wt 214.0 lb

## 2018-07-17 DIAGNOSIS — K429 Umbilical hernia without obstruction or gangrene: Secondary | ICD-10-CM

## 2018-07-17 NOTE — H&P (View-Only) (Signed)
Patient ID: Marc Myers, male   DOB: 10/06/1955, 62 y.o.   MRN: [id number]  Chief Complaint  Patient presents with  . New Patient (Initial Visit)    Umbilical hernia    HPI Marc Myers is a 62 y.o. male.   He reports that about 6 months ago, he had fairly sudden onset of severe periumbilical abdominal pain.  There was some mild nausea associated with the event, but after lying down for about 45 minutes, the pain subsided. He had no other symptoms at that time. About a week ago, he had another episode. He was at work and noted acute onset of pain similar to the prior episode. He laid down for a bit and the pain subsided, but when he tried to resume work, the pain became severe.  He again had some mild nausea, but no vomiting. The pain was localized around the umbilicus and did not radiate. It ultimately resolved on its own after he was able to lie down for a couple of hours. He sought care with his PCP who ordered a CT scan. This showed a small fat-containing umbilical hernia without intestinal involvement. He is here today, accompanied by his wife. He also has questions about some other health issues, including hypertension, elevated cholesterol, and a heart murmur that was discovered during his recent PCP visit.   Past Medical History:  Diagnosis Date  . Blood in stool    on and off since 2014   . Elevated blood pressure reading   . Fatty liver   . Heart murmur   . Hyperlipidemia   . Hypertension     Past Surgical History:  Procedure Laterality Date  . left knee surgery      Family History  Problem Relation Age of Onset  . Parkinson's disease Mother     Social History Social History   Tobacco Use  . Smoking status: Former Smoker  . Smokeless tobacco: Never Used  . Tobacco comment: light   Substance Use Topics  . Alcohol use: Yes    Comment: occ  . Drug use: Not Currently    Allergies  Allergen Reactions  . Statins Other (See Comments)    No current  outpatient medications on file.   No current facility-administered medications for this visit.     Review of Systems Review of Systems  Constitutional: Negative.   HENT: Negative.   Eyes: Negative.   Respiratory: Negative.   Cardiovascular: Negative.   Gastrointestinal: Positive for abdominal pain and nausea. Negative for vomiting.  Endocrine: Negative.   Genitourinary: Negative.   Musculoskeletal: Negative.   Skin: Negative.   Allergic/Immunologic: Negative.   Neurological: Negative.   Hematological: Negative.   Psychiatric/Behavioral: Negative.     Blood pressure (!) 139/91, pulse 67, temperature 97.8 F (36.6 C), temperature source Temporal, height 5' 9" (1.753 m), weight 214 lb (97.1 kg).  Physical Exam Physical Exam  Constitutional: He is oriented to person, place, and time. He appears well-developed and well-nourished. No distress.  HENT:  Head: Normocephalic and atraumatic.  Mouth/Throat: Oropharynx is clear and moist. No oropharyngeal exudate.  Eyes: Pupils are equal, round, and reactive to light. Conjunctivae are normal. Right eye exhibits no discharge. Left eye exhibits no discharge.  Neck: Normal range of motion. Neck supple. No tracheal deviation present. No thyromegaly present.  Cardiovascular: Normal rate, regular rhythm and intact distal pulses.  Murmur heard. Very soft 1/6 systolic murmur best appreciated at the right 2nd intercostal space.  Pulmonary/Chest: Effort normal and breath   sounds normal. No respiratory distress.  Abdominal: Soft. Bowel sounds are normal. He exhibits no distension. There is no tenderness. A hernia is present.  Small, easily reducible umbilical hernia.  Musculoskeletal: He exhibits no edema or deformity.  Lymphadenopathy:    He has no cervical adenopathy.  Neurological: He is alert and oriented to person, place, and time.  Skin: Skin is warm and dry.  Psychiatric: He has a normal mood and affect.  Vitals reviewed.   Data  Reviewed CLINICAL DATA:  Abdominal pain for 4 months. Patient believes there is a hernia.  Insert tech call report  EXAM: CT ABDOMEN AND PELVIS WITH CONTRAST  TECHNIQUE: Multidetector CT imaging of the abdomen and pelvis was performed using the standard protocol following bolus administration of intravenous contrast.  CONTRAST:  100mL OMNIPAQUE IOHEXOL 300 MG/ML  SOLN  COMPARISON:  None.  FINDINGS: Lower chest:  Negative  Hepatobiliary: Small probable cyst in the left liver. Mild hepatic steatosis.No evidence of biliary obstruction or stone.  Pancreas: Unremarkable.  Spleen: Unremarkable.  Adrenals/Urinary Tract: Negative adrenals. No hydronephrosis or stone. Small renal sinus cysts on the left. Unremarkable bladder.  Stomach/Bowel: No obstruction. No appendicitis. Rare colonic diverticula.  Vascular/Lymphatic: No acute vascular abnormality. Atherosclerotic calcification of the mildly tortuous aorta. No mass or adenopathy.  Reproductive:Negative  Other: No ascites or pneumoperitoneum. Small fatty umbilical hernia without complicating feature.  Musculoskeletal: No acute abnormalities.  IMPRESSION: 1. Small fatty umbilical hernia without complicating feature. 2. Aortic atherosclerosis. 3. Possible hepatic steatosis.   Electronically Signed   By: Jonathon  Watts M.D.   On: 07/10/2018 12:53   Assessment    62 y/o M with small umbilical hernia. At this time, there does not appear to be evidence of incarceration or strangulation, however he has had 2 episodes of significant pain, possibly related to entrapped fat/fat necrosis.     Plan    Umbilical hernia: I offered him the option of surgery for his hernia. I discussed the possibility of incarceration, strangulation, enlargement in size over time, and the risk of emergency surgery in the face of strangulation. I also discussed the risks of surgery including injury to adjacent structures,  recurrence which can be up to 30% in the case of complex hernias, use of prosthetic materials (mesh) and the increased risk of infection, post-op infection and the possible need for re-operation and removal of mesh if used, possibility of post-op bowel obstruction or ileus, and the risks of general anesthetic including MI, CVA, sudden death or even reaction to anesthetic medications.    He is unsure at this time if he would like to pursue repair and would like to think about it some. He will call us if he decides to proceed with an operation.  Hypertension, hyperlipidemia, NASH, heart murmur: I will defer management of these concerns to Mr. Fakhouri's PCP. He was encouraged to keep his scheduled follow up appointment.     Yavonne Kiss 07/17/2018, 8:58 AM   

## 2018-07-17 NOTE — Patient Instructions (Addendum)
Please call our office when you decide to move forward with having your hernia repaired. Umbilical Hernia, Adult A hernia is a bulge of tissue that pushes through an opening between muscles. An umbilical hernia happens in the abdomen, near the belly button (umbilicus). The hernia may contain tissues from the small intestine, large intestine, or fatty tissue covering the intestines (omentum). Umbilical hernias in adults tend to get worse over time, and they require surgical treatment. There are several types of umbilical hernias. You may have:  A hernia located just above or below the umbilicus (indirect hernia). This is the most common type of umbilical hernia in adults.  A hernia that forms through an opening formed by the umbilicus (direct hernia).  A hernia that comes and goes (reducible hernia). A reducible hernia may be visible only when you strain, lift something heavy, or cough. This type of hernia can be pushed back into the abdomen (reduced).  A hernia that traps abdominal tissue inside the hernia (incarcerated hernia). This type of hernia cannot be reduced.  A hernia that cuts off blood flow to the tissues inside the hernia (strangulated hernia). The tissues can start to die if this happens. This type of hernia requires emergency treatment.  What are the causes? An umbilical hernia happens when tissue inside the abdomen presses on a weak area of the abdominal muscles. What increases the risk? You may have a greater risk of this condition if you:  Are obese.  Have had several pregnancies.  Have a buildup of fluid inside your abdomen (ascites).  Have had surgery that weakens the abdominal muscles.  What are the signs or symptoms? The main symptom of this condition is a painless bulge at or near the belly button. A reducible hernia may be visible only when you strain, lift something heavy, or cough. Other symptoms may include:  Dull pain.  A feeling of pressure.  Symptoms  of a strangulated hernia may include:  Pain that gets increasingly worse.  Nausea and vomiting.  Pain when pressing on the hernia.  Skin over the hernia becoming red or purple.  Constipation.  Blood in the stool.  How is this diagnosed? This condition may be diagnosed based on:  A physical exam. You may be asked to cough or strain while standing. These actions increase the pressure inside your abdomen and force the hernia through the opening in your muscles. Your health care provider may try to reduce the hernia by pressing on it.  Your symptoms and medical history.  How is this treated? Surgery is the only treatment for an umbilical hernia. Surgery for a strangulated hernia is done as soon as possible. If you have a small hernia that is not incarcerated, you may need to lose weight before having surgery. Follow these instructions at home:  Lose weight, if told by your health care provider.  Do not try to push the hernia back in.  Watch your hernia for any changes in color or size. Tell your health care provider if any changes occur.  You may need to avoid activities that increase pressure on your hernia.  Do not lift anything that is heavier than 10 lb (4.5 kg) until your health care provider says that this is safe.  Take over-the-counter and prescription medicines only as told by your health care provider.  Keep all follow-up visits as told by your health care provider. This is important. Contact a health care provider if:  Your hernia gets larger.  Your hernia  becomes painful. Get help right away if:  You develop sudden, severe pain near the area of your hernia.  You have pain as well as nausea or vomiting.  You have pain and the skin over your hernia changes color.  You develop a fever. This information is not intended to replace advice given to you by your health care provider. Make sure you discuss any questions you have with your health care  provider. Document Released: 01/20/2016 Document Revised: 04/22/2016 Document Reviewed: 01/20/2016 Elsevier Interactive Patient Education  [employer]2018 [employer]

## 2018-07-17 NOTE — Progress Notes (Signed)
Patient ID: Marc Myers, male   DOB: Feb 17, 1956, 62 y.o.   MRN: [id number]  Chief Complaint  Patient presents with  . New Patient (Initial Visit)    Umbilical hernia    HPI Marc Myers is a 62 y.o. male.   He reports that about 6 months ago, he had fairly sudden onset of severe periumbilical abdominal pain.  There was some mild nausea associated with the event, but after lying down for about 45 minutes, the pain subsided. He had no other symptoms at that time. About a week ago, he had another episode. He was at work and noted acute onset of pain similar to the prior episode. He laid down for a bit and the pain subsided, but when he tried to resume work, the pain became severe.  He again had some mild nausea, but no vomiting. The pain was localized around the umbilicus and did not radiate. It ultimately resolved on its own after he was able to lie down for a couple of hours. He sought care with his PCP who ordered a CT scan. This showed a small fat-containing umbilical hernia without intestinal involvement. He is here today, accompanied by his wife. He also has questions about some other health issues, including hypertension, elevated cholesterol, and a heart murmur that was discovered during his recent PCP visit.   Past Medical History:  Diagnosis Date  . Blood in stool    on and off since 2014   . Elevated blood pressure reading   . Fatty liver   . Heart murmur   . Hyperlipidemia   . Hypertension     Past Surgical History:  Procedure Laterality Date  . left knee surgery      Family History  Problem Relation Age of Onset  . Parkinson's disease Mother     Social History Social History   Tobacco Use  . Smoking status: Former Games developermoker  . Smokeless tobacco: Never Used  . Tobacco comment: light   Substance Use Topics  . Alcohol use: Yes    Comment: occ  . Drug use: Not Currently    Allergies  Allergen Reactions  . Statins Other (See Comments)    No current  outpatient medications on file.   No current facility-administered medications for this visit.     Review of Systems Review of Systems  Constitutional: Negative.   HENT: Negative.   Eyes: Negative.   Respiratory: Negative.   Cardiovascular: Negative.   Gastrointestinal: Positive for abdominal pain and nausea. Negative for vomiting.  Endocrine: Negative.   Genitourinary: Negative.   Musculoskeletal: Negative.   Skin: Negative.   Allergic/Immunologic: Negative.   Neurological: Negative.   Hematological: Negative.   Psychiatric/Behavioral: Negative.     Blood pressure (!) 139/91, pulse 67, temperature 97.8 F (36.6 C), temperature source Temporal, height 5\' 9"  (1.753 m), weight 214 lb (97.1 kg).  Physical Exam Physical Exam  Constitutional: He is oriented to person, place, and time. He appears well-developed and well-nourished. No distress.  HENT:  Head: Normocephalic and atraumatic.  Mouth/Throat: Oropharynx is clear and moist. No oropharyngeal exudate.  Eyes: Pupils are equal, round, and reactive to light. Conjunctivae are normal. Right eye exhibits no discharge. Left eye exhibits no discharge.  Neck: Normal range of motion. Neck supple. No tracheal deviation present. No thyromegaly present.  Cardiovascular: Normal rate, regular rhythm and intact distal pulses.  Murmur heard. Very soft 1/6 systolic murmur best appreciated at the right 2nd intercostal space.  Pulmonary/Chest: Effort normal and breath  sounds normal. No respiratory distress.  Abdominal: Soft. Bowel sounds are normal. He exhibits no distension. There is no tenderness. A hernia is present.  Small, easily reducible umbilical hernia.  Musculoskeletal: He exhibits no edema or deformity.  Lymphadenopathy:    He has no cervical adenopathy.  Neurological: He is alert and oriented to person, place, and time.  Skin: Skin is warm and dry.  Psychiatric: He has a normal mood and affect.  Vitals reviewed.   Data  Reviewed CLINICAL DATA:  Abdominal pain for 4 months. Patient believes there is a hernia.  Insert tech call report  EXAM: CT ABDOMEN AND PELVIS WITH CONTRAST  TECHNIQUE: Multidetector CT imaging of the abdomen and pelvis was performed using the standard protocol following bolus administration of intravenous contrast.  CONTRAST:  OMNIPAQUE IOHEXOL 300 MG/ML  SOLN  COMPARISON:  None.  FINDINGS: Lower chest:  Negative  Hepatobiliary: Small probable cyst in the left liver. Mild hepatic steatosis.No evidence of biliary obstruction or stone.  Pancreas: Unremarkable.  Spleen: Unremarkable.  Adrenals/Urinary Tract: Negative adrenals. No hydronephrosis or stone. Small renal sinus cysts on the left. Unremarkable bladder.  Stomach/Bowel: No obstruction. No appendicitis. Rare colonic diverticula.  Vascular/Lymphatic: No acute vascular abnormality. Atherosclerotic calcification of the mildly tortuous aorta. No mass or adenopathy.  Reproductive:Negative  Other: No ascites or pneumoperitoneum. Small fatty umbilical hernia without complicating feature.  Musculoskeletal: No acute abnormalities.  IMPRESSION: 1. Small fatty umbilical hernia without complicating feature. 2. Aortic atherosclerosis. 3. Possible hepatic steatosis.   Electronically Signed   By: Marnee Spring M.D.   On: 07/10/2018 12:53   Assessment    62 y/o M with small umbilical hernia. At this time, there does not appear to be evidence of incarceration or strangulation, however he has had 2 episodes of significant pain, possibly related to entrapped fat/fat necrosis.     Plan    Umbilical hernia: I offered him the option of surgery for his hernia. I discussed the possibility of incarceration, strangulation, enlargement in size over time, and the risk of emergency surgery in the face of strangulation. I also discussed the risks of surgery including injury to adjacent structures,  recurrence which can be up to 30% in the case of complex hernias, use of prosthetic materials (mesh) and the increased risk of infection, post-op infection and the possible need for re-operation and removal of mesh if used, possibility of post-op bowel obstruction or ileus, and the risks of general anesthetic including MI, CVA, sudden death or even reaction to anesthetic medications.    He is unsure at this time if he would like to pursue repair and would like to think about it some. He will call us if he decides to proceed with an operation.  Hypertension, hyperlipidemia, NASH, heart murmur: I will defer management of these concerns to Mr. Storrs's PCP. He was encouraged to keep his scheduled follow up appointment.     Duanne Guess 07/17/2018, 8:58 AM

## 2018-07-21 ENCOUNTER — Other Ambulatory Visit: Payer: Self-pay | Admitting: General Surgery

## 2018-07-21 DIAGNOSIS — K429 Umbilical hernia without obstruction or gangrene: Secondary | ICD-10-CM

## 2018-07-22 ENCOUNTER — Telehealth: Payer: Self-pay

## 2018-07-22 NOTE — Telephone Encounter (Signed)
Patient called and would like to proceed with surgery. He is scheduled for surgery at [facility] on 08/08/18 with Dr Lady Garyannon. He will pre admit by phone on 07/30/18. He is aware of dates and instructions. Laneta SimmersJamie Benson, RN will be assisting with this case.

## 2018-07-25 ENCOUNTER — Telehealth: Payer: Self-pay

## 2018-07-25 NOTE — Telephone Encounter (Signed)
The patient's surgery has been rescheduled to 07/30/18. He will pre admit 2 hours prior and will arrive at the medical mall registration desk at 7:00 am. He is aware of date, time, and instructions.

## 2018-07-29 ENCOUNTER — Encounter: Payer: Self-pay | Admitting: Anesthesiology

## 2018-07-29 MED ORDER — CEFAZOLIN SODIUM-DEXTROSE 2-4 GM/100ML-% IV SOLN
2.0000 g | INTRAVENOUS | Status: AC
  Administered 2018-07-30: 2 g via INTRAVENOUS

## 2018-07-30 ENCOUNTER — Ambulatory Visit: Payer: BLUE CROSS/BLUE SHIELD | Admitting: Anesthesiology

## 2018-07-30 ENCOUNTER — Encounter
Admission: RE | Disposition: A | Discharge status: Home / Self Care | Payer: Self-pay | Source: Ambulatory Visit | Attending: General Surgery

## 2018-07-30 ENCOUNTER — Inpatient Hospital Stay: Admission: RE | Admit: 2018-07-30 | Payer: BLUE CROSS/BLUE SHIELD | Source: Ambulatory Visit

## 2018-07-30 ENCOUNTER — Ambulatory Visit
Admission: RE | Admit: 2018-07-30 | Discharge: 2018-07-30 | Disposition: A | Discharge status: Home / Self Care | Payer: BLUE CROSS/BLUE SHIELD | Source: Ambulatory Visit | Attending: General Surgery | Admitting: General Surgery

## 2018-07-30 ENCOUNTER — Other Ambulatory Visit: Payer: Self-pay

## 2018-07-30 ENCOUNTER — Encounter: Payer: Self-pay | Admitting: *Deleted

## 2018-07-30 DIAGNOSIS — E785 Hyperlipidemia, unspecified: Secondary | ICD-10-CM | POA: Insufficient documentation

## 2018-07-30 DIAGNOSIS — K429 Umbilical hernia without obstruction or gangrene: Secondary | ICD-10-CM | POA: Diagnosis present

## 2018-07-30 DIAGNOSIS — R011 Cardiac murmur, unspecified: Secondary | ICD-10-CM | POA: Insufficient documentation

## 2018-07-30 DIAGNOSIS — I10 Essential (primary) hypertension: Secondary | ICD-10-CM | POA: Diagnosis not present

## 2018-07-30 DIAGNOSIS — Z820 Family history of epilepsy and other diseases of the nervous system: Secondary | ICD-10-CM | POA: Insufficient documentation

## 2018-07-30 DIAGNOSIS — K760 Fatty (change of) liver, not elsewhere classified: Secondary | ICD-10-CM | POA: Diagnosis not present

## 2018-07-30 DIAGNOSIS — I700 Atherosclerosis of aorta: Secondary | ICD-10-CM | POA: Diagnosis not present

## 2018-07-30 DIAGNOSIS — Z888 Allergy status to other drugs, medicaments and biological substances status: Secondary | ICD-10-CM | POA: Insufficient documentation

## 2018-07-30 DIAGNOSIS — Z87891 Personal history of nicotine dependence: Secondary | ICD-10-CM | POA: Insufficient documentation

## 2018-07-30 HISTORY — PX: UMBILICAL HERNIA REPAIR: SHX196

## 2018-07-30 SURGERY — REPAIR, HERNIA, UMBILICAL, ADULT
Anesthesia: General

## 2018-07-30 MED ORDER — DEXAMETHASONE SODIUM PHOSPHATE 10 MG/ML IJ SOLN
INTRAMUSCULAR | Status: DC | PRN
  Administered 2018-07-30: 10 mg via INTRAVENOUS

## 2018-07-30 MED ORDER — LIDOCAINE-EPINEPHRINE 1 %-1:100000 IJ SOLN
INTRAMUSCULAR | Status: AC
  Filled 2018-07-30: qty 1

## 2018-07-30 MED ORDER — CHLORHEXIDINE GLUCONATE CLOTH 2 % EX PADS: 6.0000 | MEDICATED_PAD | Freq: Once | CUTANEOUS | Status: DC

## 2018-07-30 MED ORDER — LIDOCAINE-EPINEPHRINE 1 %-1:100000 IJ SOLN
INTRAMUSCULAR | Status: DC | PRN
  Administered 2018-07-30: 10 mL

## 2018-07-30 MED ORDER — CHLORHEXIDINE GLUCONATE CLOTH 2 % EX PADS
6.0000 | MEDICATED_PAD | Freq: Once | CUTANEOUS | Status: AC
  Administered 2018-07-30: 6 via TOPICAL

## 2018-07-30 MED ORDER — ACETAMINOPHEN 500 MG PO TABS
1000.0000 mg | ORAL_TABLET | ORAL | Status: AC
  Administered 2018-07-30: 1000 mg via ORAL

## 2018-07-30 MED ORDER — LIDOCAINE HCL (CARDIAC) PF 100 MG/5ML IV SOSY
PREFILLED_SYRINGE | INTRAVENOUS | Status: DC | PRN
  Administered 2018-07-30: 100 mg via INTRAVENOUS

## 2018-07-30 MED ORDER — MIDAZOLAM HCL 2 MG/2ML IJ SOLN
INTRAMUSCULAR | Status: DC | PRN
  Administered 2018-07-30: 2 mg via INTRAVENOUS

## 2018-07-30 MED ORDER — PROPOFOL 10 MG/ML IV BOLUS
INTRAVENOUS | Status: DC | PRN
  Administered 2018-07-30: 30 mg via INTRAVENOUS
  Administered 2018-07-30: 140 mg via INTRAVENOUS

## 2018-07-30 MED ORDER — CEFAZOLIN SODIUM-DEXTROSE 2-4 GM/100ML-% IV SOLN
INTRAVENOUS | Status: AC
  Filled 2018-07-30: qty 100

## 2018-07-30 MED ORDER — FAMOTIDINE 20 MG PO TABS
ORAL_TABLET | ORAL | Status: AC
  Filled 2018-07-30: qty 1

## 2018-07-30 MED ORDER — DEXAMETHASONE SODIUM PHOSPHATE 10 MG/ML IJ SOLN
INTRAMUSCULAR | Status: AC
  Filled 2018-07-30: qty 1

## 2018-07-30 MED ORDER — ONDANSETRON HCL 4 MG/2ML IJ SOLN: 4.0000 mg | Freq: Once | INTRAMUSCULAR | Status: DC | PRN

## 2018-07-30 MED ORDER — BUPIVACAINE HCL (PF) 0.25 % IJ SOLN
INTRAMUSCULAR | Status: AC
  Filled 2018-07-30: qty 30

## 2018-07-30 MED ORDER — BUPIVACAINE HCL (PF) 0.25 % IJ SOLN
INTRAMUSCULAR | Status: DC | PRN
  Administered 2018-07-30: 10 mL

## 2018-07-30 MED ORDER — FAMOTIDINE 20 MG PO TABS
20.0000 mg | ORAL_TABLET | Freq: Once | ORAL | Status: AC
  Administered 2018-07-30: 20 mg via ORAL

## 2018-07-30 MED ORDER — GLYCOPYRROLATE 0.2 MG/ML IJ SOLN
INTRAMUSCULAR | Status: AC
  Filled 2018-07-30: qty 1

## 2018-07-30 MED ORDER — EPHEDRINE SULFATE 50 MG/ML IJ SOLN
INTRAMUSCULAR | Status: DC | PRN
  Administered 2018-07-30 (×4): 10 mg via INTRAVENOUS

## 2018-07-30 MED ORDER — EPHEDRINE SULFATE 50 MG/ML IJ SOLN
INTRAMUSCULAR | Status: AC
  Filled 2018-07-30: qty 1

## 2018-07-30 MED ORDER — GABAPENTIN 300 MG PO CAPS
300.0000 mg | ORAL_CAPSULE | ORAL | Status: AC
  Administered 2018-07-30: 300 mg via ORAL

## 2018-07-30 MED ORDER — IBUPROFEN 800 MG PO TABS: 800.0000 mg | ORAL_TABLET | Freq: Three times a day (TID) | ORAL | 0 refills | Status: DC | PRN

## 2018-07-30 MED ORDER — GLYCOPYRROLATE 0.2 MG/ML IJ SOLN
INTRAMUSCULAR | Status: DC | PRN
  Administered 2018-07-30: 0.2 mg via INTRAVENOUS

## 2018-07-30 MED ORDER — ACETAMINOPHEN 500 MG PO TABS
ORAL_TABLET | ORAL | Status: AC
  Filled 2018-07-30: qty 2

## 2018-07-30 MED ORDER — CELECOXIB 200 MG PO CAPS
ORAL_CAPSULE | ORAL | Status: AC
  Filled 2018-07-30: qty 1

## 2018-07-30 MED ORDER — LIDOCAINE HCL (PF) 2 % IJ SOLN
INTRAMUSCULAR | Status: AC
  Filled 2018-07-30: qty 10

## 2018-07-30 MED ORDER — PROPOFOL 10 MG/ML IV BOLUS
INTRAVENOUS | Status: AC
  Filled 2018-07-30: qty 20

## 2018-07-30 MED ORDER — DEXMEDETOMIDINE HCL 200 MCG/2ML IV SOLN
INTRAVENOUS | Status: DC | PRN
  Administered 2018-07-30: 8 ug via INTRAVENOUS
  Administered 2018-07-30: 4 ug via INTRAVENOUS

## 2018-07-30 MED ORDER — FENTANYL CITRATE (PF) 100 MCG/2ML IJ SOLN
INTRAMUSCULAR | Status: AC
  Filled 2018-07-30: qty 2

## 2018-07-30 MED ORDER — HYDROCODONE-ACETAMINOPHEN 5-325 MG PO TABS: 1.0000 | ORAL_TABLET | Freq: Four times a day (QID) | ORAL | 0 refills | Status: DC | PRN

## 2018-07-30 MED ORDER — ONDANSETRON HCL 4 MG/2ML IJ SOLN
INTRAMUSCULAR | Status: DC | PRN
  Administered 2018-07-30: 4 mg via INTRAVENOUS

## 2018-07-30 MED ORDER — FENTANYL CITRATE (PF) 100 MCG/2ML IJ SOLN: 25.0000 ug | INTRAMUSCULAR | Status: DC | PRN

## 2018-07-30 MED ORDER — FENTANYL CITRATE (PF) 100 MCG/2ML IJ SOLN
INTRAMUSCULAR | Status: DC | PRN
  Administered 2018-07-30: 50 ug via INTRAVENOUS
  Administered 2018-07-30 (×2): 25 ug via INTRAVENOUS

## 2018-07-30 MED ORDER — CELECOXIB 200 MG PO CAPS
200.0000 mg | ORAL_CAPSULE | ORAL | Status: AC
  Administered 2018-07-30: 200 mg via ORAL

## 2018-07-30 MED ORDER — MIDAZOLAM HCL 2 MG/2ML IJ SOLN
INTRAMUSCULAR | Status: AC
  Filled 2018-07-30: qty 2

## 2018-07-30 MED ORDER — LACTATED RINGERS IV SOLN
INTRAVENOUS | Status: DC
  Administered 2018-07-30: 09:00:00 via INTRAVENOUS

## 2018-07-30 MED ORDER — ONDANSETRON HCL 4 MG/2ML IJ SOLN
INTRAMUSCULAR | Status: AC
  Filled 2018-07-30: qty 2

## 2018-07-30 MED ORDER — GABAPENTIN 300 MG PO CAPS
ORAL_CAPSULE | ORAL | Status: AC
  Filled 2018-07-30: qty 1

## 2018-07-30 SURGICAL SUPPLY — 28 items
BLADE SURG 15 STRL LF DISP TIS (BLADE) ×1 IMPLANT
BLADE SURG 15 STRL SS (BLADE) ×1
CANISTER SUCT 1200ML W/VALVE (MISCELLANEOUS) ×2 IMPLANT
CHLORAPREP W/TINT 26ML (MISCELLANEOUS) ×2 IMPLANT
COVER WAND RF STERILE (DRAPES) ×2 IMPLANT
DERMABOND ADVANCED (GAUZE/BANDAGES/DRESSINGS) ×1
DERMABOND ADVANCED .7 DNX12 (GAUZE/BANDAGES/DRESSINGS) ×1 IMPLANT
DRAPE LAPAROTOMY 77X122 PED (DRAPES) ×2 IMPLANT
ELECT CAUTERY BLADE 6.4 (BLADE) ×2 IMPLANT
ELECT REM PT RETURN 9FT ADLT (ELECTROSURGICAL) ×2
ELECTRODE REM PT RTRN 9FT ADLT (ELECTROSURGICAL) ×1 IMPLANT
GLOVE BIO SURGEON STRL SZ 6.5 (GLOVE) ×2 IMPLANT
GLOVE INDICATOR 7.0 STRL GRN (GLOVE) ×2 IMPLANT
GOWN STRL REUS W/ TWL LRG LVL3 (GOWN DISPOSABLE) ×2 IMPLANT
GOWN STRL REUS W/TWL LRG LVL3 (GOWN DISPOSABLE) ×2
KIT TURNOVER KIT A (KITS) ×2 IMPLANT
LABEL OR SOLS (LABEL) ×2 IMPLANT
NS IRRIG 500ML POUR BTL (IV SOLUTION) ×2 IMPLANT
PACK BASIN MINOR ARMC (MISCELLANEOUS) ×2 IMPLANT
SLEEVE PROTECTION STRL DISP (MISCELLANEOUS) ×1 IMPLANT
STRIP CLOSURE SKIN 1/2X4 (GAUZE/BANDAGES/DRESSINGS) ×3 IMPLANT
SUT ETHIBOND NAB MO 7 #0 18IN (SUTURE) IMPLANT
SUT MNCRL 4-0 (SUTURE) ×1
SUT MNCRL 4-0 27XMFL (SUTURE) ×1
SUT VIC AB 3-0 SH 27 (SUTURE) ×1
SUT VIC AB 3-0 SH 27X BRD (SUTURE) ×1 IMPLANT
SUTURE MNCRL 4-0 27XMF (SUTURE) ×1 IMPLANT
SYR BULB IRRIG 60ML STRL (SYRINGE) ×2 IMPLANT

## 2018-07-30 NOTE — Op Note (Signed)
Operative Note  Pre-operative Diagnosis: umbilical hernia  Post-operative Diagnosis: same  Operation: umbilical hernia repair without mesh  Surgeon: Duanne GuessJennifer Bryndan Bilyk, MD  Anesthesia: General via LMA  Assistant:Jamie Teena DunkBenson  Findings: ~1cm facial defect with preperitoneal fat within it  Estimated Blood Loss: <2 cc         Drains: none         Specimens: hernia sac          Complications: none immediately apparent         Condition: stable  Procedure Details  The patient was identified in the preoperative holding area. The benefits, complications, treatment options, and expected outcomes were discussed with the patient. The risks of bleeding, infection, recurrence of symptoms, failure to resolve symptoms, bowel injury, any of which could require further surgery were reviewed with the patient. The patient agreed to accept these risks. The patient was then taken to the operating room, identified as Marc Myers and the procedure verified.  A time out was performed and the above information confirmed.  Prior to the induction of general anesthesia, antibiotic prophylaxis was administered. VTE prophylaxis was in place. General anesthesia via LMA was then administered and tolerated well. After induction, the abdomen was prepped with Chloraprep and draped in standard sterile fashion. The patient was positioned in the supine position.  The umbilical incision was created over the hernia sac and electrocautery was used to dissect through subcutaneous tissue. The hernia was dissected free from adjacent tissue and fascia. The hernia sac was entered and the sac was excised. Care was taken to avoid any injury to the bowel. Due to the tiny defect (~1cm), no mesh was used and the fascia was repaired primarily with interrupted 0 Ethibond sutures in standard fashion. The cutaneous tissue was closed with 3-0 Vicryl and skin was closed with a subcuticular 4-0 Monocryl in a subcuticular fashion. Dermabond  was applied to the skin. Lidocaine 1% with epinephrine and Marcaine quarter percent was used to inject all the incision sites. Patient tolerated procedure well and there were no immediate complications identified. Needle, instrument, and sponge counts were reported to be correct by the nursing staff.  Duanne GuessJennifer Kashia Brossard, MD FACS

## 2018-07-30 NOTE — Anesthesia Procedure Notes (Signed)
Procedure Name: LMA Insertion Date/Time: 07/30/2018 9:06 AM Performed by: Oletta LamasBlackwell, Anija Brickner Delores, CRNA Pre-anesthesia Checklist: Patient identified, Emergency Drugs available, Suction available, Timeout performed and Patient being monitored Patient Re-evaluated:Patient Re-evaluated prior to induction Oxygen Delivery Method: Circle system utilized Preoxygenation: Pre-oxygenation with 100% oxygen Induction Type: IV induction Ventilation: Oral airway inserted - appropriate to patient size LMA: LMA inserted LMA Size: 5.0 Number of attempts: 1 Placement Confirmation: positive ETCO2 and breath sounds checked- equal and bilateral Tube secured with: Tape Dental Injury: Teeth and Oropharynx as per pre-operative assessment

## 2018-07-30 NOTE — Interval H&P Note (Signed)
History and Physical Interval Note:  07/30/2018 8:40 AM  Marc Myers  has presented today for surgery, with the diagnosis of umbilical hernia  The various methods of treatment have been discussed with the patient and family. After consideration of risks, benefits and other options for treatment, the patient has consented to  Procedure(s): HERNIA REPAIR UMBILICAL ADULT (N/A) as a surgical intervention .  The patient's history has been reviewed, patient examined, no change in status, stable for surgery.  I have reviewed the patient's chart and labs.  Questions were answered to the patient's satisfaction.     Duanne GuessJennifer Jarelis Ehlert

## 2018-07-30 NOTE — Anesthesia Preprocedure Evaluation (Signed)
Anesthesia Evaluation  Patient identified by MRN, date of birth, ID band Patient awake    Reviewed: Allergy & Precautions, NPO status , Patient's Chart, lab work & pertinent test results, reviewed documented beta blocker date and time   Airway Mallampati: III  TM Distance: >3 FB     Dental  (+) Chipped   Pulmonary former smoker,           Cardiovascular hypertension, Pt. on medications + Valvular Problems/Murmurs      Neuro/Psych    GI/Hepatic   Endo/Other    Renal/GU      Musculoskeletal   Abdominal   Peds  Hematology   Anesthesia Other Findings   Reproductive/Obstetrics                             Anesthesia Physical Anesthesia Plan  ASA: II  Anesthesia Plan: General   Post-op Pain Management:    Induction: Intravenous  PONV Risk Score and Plan:   Airway Management Planned: LMA  Additional Equipment:   Intra-op Plan:   Post-operative Plan:   Informed Consent: I have reviewed the patients History and Physical, chart, labs and discussed the procedure including the risks, benefits and alternatives for the proposed anesthesia with the patient or authorized representative who has indicated his/her understanding and acceptance.     Plan Discussed with: CRNA  Anesthesia Plan Comments:         Anesthesia Quick Evaluation

## 2018-07-30 NOTE — Transfer of Care (Signed)
Immediate Anesthesia Transfer of Care Note  Patient: Marc EvansRobert Ruppert  Procedure(s) Performed: HERNIA REPAIR UMBILICAL ADULT (N/A )  Patient Location: PACU  Anesthesia Type:General  Level of Consciousness: awake and patient cooperative  Airway & Oxygen Therapy: Patient Spontanous Breathing and Patient connected to face mask  Post-op Assessment: Report given to RN and Post -op Vital signs reviewed and stable  Post vital signs: stable  Last Vitals:  Vitals Value Taken Time  BP 146/87 07/30/2018 10:08 AM  Temp 36.8 C 07/30/2018 10:07 AM  Pulse 90 07/30/2018 10:12 AM  Resp 14 07/30/2018 10:12 AM  SpO2 99 % 07/30/2018 10:12 AM  Vitals shown include unvalidated device data.  Last Pain:  Vitals:   07/30/18 1007  TempSrc:   PainSc: 0-No pain         Complications: No apparent anesthesia complications

## 2018-07-30 NOTE — Discharge Instructions (Signed)
Care After Umbilical Hernia Repair  Refer to this sheet in the next few weeks. These instructions provide you with information on caring for yourself after your procedure. Your health care provider may also give you more specific instructions. Your treatment has been planned according to current medical practices, but problems sometimes occur. Call your health care provider if you have any problems or questions after your procedure.  HOME CARE INSTRUCTIONS   Only take over-the-counter or prescription medicines for pain, fever, or discomfort as directed by your health care provider. Do not take aspirin. It can cause bleeding.   Avoid lifting objects heavier than 10 lb (4.5 kg) for 6 weeks after surgery.   Avoid sexual activity and/or contact sports for 2 weeks after surgery or as directed by your health care provider.   Do not drive while taking prescription pain medicine.   You may return to your other normal, daily activities after 3 days or as directed by your health care provider. SEEK MEDICAL CARE IF:   You notice blood, fluid, or pus leaking from the surgical site.   Your incision area becomes red or swollen.   Your pain at the surgical site becomes worse or is not relieved by medicine.   You have problems urinating.   You feel nauseated or vomit more than 2 days after surgery.   You notice the bulge in your abdomen returns after the procedure. SEEK IMMEDIATE MEDICAL CARE IF:   You have a fever >101.5  You have nausea or vomiting that will not stop.     Open Hernia Repair, Adult, Care After These instructions give you information about caring for yourself after your procedure. Your doctor may also give you more specific instructions. If you have problems or questions, contact your doctor. Follow these instructions at home: Surgical cut (incision) care   Follow instructions from your doctor about how to take care of your surgical cut area. Make sure you: ? Wash your  hands with soap and water before you change your bandage (dressing). If you cannot use soap and water, use hand sanitizer. ? Change your bandage as told by your doctor. ? Leave stitches (sutures), skin glue, or skin tape (adhesive) strips in place. They may need to stay in place for 2 weeks or longer. If tape strips get loose and curl up, you may trim the loose edges. Do not remove tape strips completely unless your doctor says it is okay.  Check your surgical cut every day for signs of infection. Check for: ? More redness, swelling, or pain. ? More fluid or blood. ? Warmth. ? Pus or a bad smell. Activity  Do not drive or use heavy machinery while taking prescription pain medicine. Do not drive until your doctor says it is okay.  Until your doctor says it is okay: ? Do not lift anything that is heavier than 10 lb (4.5 kg). ? Do not play contact sports.  Return to your normal activities as told by your doctor. Ask your doctor what activities are safe. General instructions  To prevent or treat having a hard time pooping (constipation) while you are taking prescription pain medicine, your doctor may recommend that you: ? Drink enough fluid to keep your pee (urine) clear or pale yellow. ? Take over-the-counter or prescription medicines. ? Eat foods that are high in fiber, such as fresh fruits and vegetables, whole grains, and beans. ? Limit foods that are high in fat and processed sugars, such as fried and sweet  foods.  Take over-the-counter and prescription medicines only as told by your doctor.  Do not take baths, swim, or use a hot tub until your doctor says it is okay.  Keep all follow-up visits as told by your doctor. This is important. Contact a doctor if:  You develop a rash.  You have more redness, swelling, or pain around your surgical cut.  You have more fluid or blood coming from your surgical cut.  Your surgical cut feels warm to the touch.  You have pus or a bad  smell coming from your surgical cut.  You have a fever or chills.  You have blood in your poop (stool).  You have not pooped in 2-3 days.  Medicine does not help your pain. Get help right away if:  You have chest pain or you are short of breath.  You feel light-headed.  You feel weak and dizzy (feel faint).  You have very bad pain.  You throw up (vomit) and your pain is worse. This information is not intended to replace advice given to you by your health care provider. Make sure you discuss any questions you have with your health care provider. Document Released: 09/10/2014 Document Revised: 03/09/2016 Document Reviewed: 02/01/2016 [employer] Interactive Patient Education  2018 [employer]   AMBULATORY SURGERY  DISCHARGE INSTRUCTIONS   1) The drugs that you were given will stay in your system until tomorrow so for the next 24 hours you should not:  A) Drive an automobile B) Make any legal decisions C) Drink any alcoholic beverage   2) You may resume regular meals tomorrow.  Today it is better to start with liquids and gradually work up to solid foods.  You may eat anything you prefer, but it is better to start with liquids, then soup and crackers, and gradually work up to solid foods.   3) Please notify your doctor immediately if you have any unusual bleeding, trouble breathing, redness and pain at the surgery site, drainage, fever, or pain not relieved by medication.    4) Additional Instructions:        Please contact your physician with any problems or Same Day Surgery at [phone], Monday through Friday 6 am to 4 pm, or [place] [facility] number at [phone].

## 2018-07-30 NOTE — Anesthesia Post-op Follow-up Note (Signed)
Anesthesia QCDR form completed.        

## 2018-07-30 NOTE — Brief Op Note (Signed)
07/30/2018  9:50 AM  PATIENT:  Marc Myers  62 y.o. male  PRE-OPERATIVE DIAGNOSIS:  umbilical hernia  POST-OPERATIVE DIAGNOSIS:  umbilical hernia  PROCEDURE:  Procedure(s): HERNIA REPAIR UMBILICAL ADULT (N/A)  SURGEON:  Surgeon(s) and Role:    * Duanne Guessannon, Giacomo Valone, MD - Primary  PHYSICIAN ASSISTANT:   ASSISTANTS: Laneta SimmersJamie Benson,    ANESTHESIA:   General via LMA  EBL:  2 mL   BLOOD ADMINISTERED:none  DRAINS: none   LOCAL MEDICATIONS USED:  OTHER 1:1 mixture of 0.25% bupivicaine and 1% lidocaine with 1:100,000 epinephrine  SPECIMEN:  Source of Specimen:  hernia sac  DISPOSITION OF SPECIMEN:  PATHOLOGY  COUNTS:  YES  TOURNIQUET:  * No tourniquets in log *  DICTATION: .Note written in EPIC  PLAN OF CARE: Discharge to home after PACU  PATIENT DISPOSITION:  home after PACU   Delay start of Pharmacological VTE agent (>24hrs) due to surgical blood loss or risk of bleeding: not applicable

## 2018-07-30 NOTE — Anesthesia Postprocedure Evaluation (Signed)
Anesthesia Post Note  Patient: Marc EvansRobert Langlinais  Procedure(s) Performed: HERNIA REPAIR UMBILICAL ADULT (N/A )  Patient location during evaluation: PACU Anesthesia Type: General Level of consciousness: awake and alert Pain management: pain level controlled Vital Signs Assessment: post-procedure vital signs reviewed and stable Respiratory status: spontaneous breathing, nonlabored ventilation, respiratory function stable and patient connected to nasal cannula oxygen Cardiovascular status: blood pressure returned to baseline and stable Postop Assessment: no apparent nausea or vomiting Anesthetic complications: no     Last Vitals:  Vitals:   07/30/18 1037 07/30/18 1045  BP: 129/80 137/84  Pulse: 76 80  Resp: 16 16  Temp: (!) 36.3 C 36.6 C  SpO2: 96% 96%    Last Pain:  Vitals:   07/30/18 1045  TempSrc:   PainSc: 0-No pain                 Boruch Manuele S

## 2018-08-01 LAB — SURGICAL PATHOLOGY

## 2018-08-20 ENCOUNTER — Ambulatory Visit (INDEPENDENT_AMBULATORY_CARE_PROVIDER_SITE_OTHER): Payer: BLUE CROSS/BLUE SHIELD | Admitting: General Surgery

## 2018-08-20 ENCOUNTER — Other Ambulatory Visit: Payer: Self-pay

## 2018-08-20 ENCOUNTER — Encounter: Payer: Self-pay | Admitting: General Surgery

## 2018-08-20 VITALS — BP 137/92 | HR 89 | Temp 97.7°F | Resp 18 | Ht 69.0 in | Wt 208.0 lb

## 2018-08-20 DIAGNOSIS — Z09 Encounter for follow-up examination after completed treatment for conditions other than malignant neoplasm: Secondary | ICD-10-CM

## 2018-08-20 NOTE — Patient Instructions (Addendum)
The patient is aware to call back for any questions or new concerns. Resume activities as tolerated. You should not lift more than 10 pounds for 3 weeks  GENERAL POST-OPERATIVE PATIENT INSTRUCTIONS   WOUND CARE INSTRUCTIONS:  Keep a dry clean dressing on the wound if there is drainage. The initial bandage may be removed after 24 hours.  Once the wound has quit draining you may leave it open to air.  If clothing rubs against the wound or causes irritation and the wound is not draining you may cover it with a dry dressing during the daytime.  Try to keep the wound dry and avoid ointments on the wound unless directed to do so.  If the wound becomes bright red and painful or starts to drain infected material that is not clear, please contact your physician immediately.  If the wound is mildly pink and has a thick firm ridge underneath it, this is normal, and is referred to as a healing ridge.  This will resolve over the next 4-6 weeks.  BATHING: You may shower if you have been informed of this by your surgeon. However, Please do not submerge in a tub, hot tub, or pool until incisions are completely sealed or have been told by your surgeon that you may do so.  DIET:  You may eat any foods that you can tolerate.  It is a good idea to eat a high fiber diet and take in plenty of fluids to prevent constipation.  If you do become constipated you may want to take a mild laxative or take ducolax tablets on a daily basis until your bowel habits are regular.  Constipation can be very uncomfortable, along with straining, after recent surgery.  ACTIVITY:  You are encouraged to cough and deep breath or use your incentive spirometer if you were given one, every 15-30 minutes when awake.  This will help prevent respiratory complications and low grade fevers post-operatively if you had a general anesthetic.  You may want to hug a pillow when coughing and sneezing to add additional support to the surgical area, if you had  abdominal or chest surgery, which will decrease pain during these times.  You are encouraged to walk and engage in light activity for the next two weeks.   You should not lift more than 10 pounds, for 3 weeks, as it could put you at increased risk for complications.  At that time- Listen to your body when lifting, if you have pain when lifting, stop and then try again in a few days. Soreness after doing exercises or activities of daily living is normal as you get back in to your normal routine.  MEDICATIONS:  Try to take narcotic medications and anti-inflammatory medications, such as tylenol, ibuprofen, naprosyn, etc., with food.  This will minimize stomach upset from the medication.  Should you develop nausea and vomiting from the pain medication, or develop a rash, please discontinue the medication and contact your physician.  You should not drive, make important decisions, or operate machinery when taking narcotic pain medication.  SUNBLOCK Use sun block to incision area over the next year if this area will be exposed to sun. This helps decrease scarring and will allow you avoid a permanent darkened area over your incision.  QUESTIONS:  Please feel free to call our office if you have any questions, and we will be glad to assist you.

## 2018-08-20 NOTE — Progress Notes (Signed)
Marc Myers is here for a postop visit after undergoing an uncomplicated umbilical hernia repair.  He states that he has done well since surgery.  He has had no concerns with pain control.  He has had no fevers or chills.  Appetite and bowel function are normal.  Vitals:   08/20/18 1356  BP: (!) 137/92  Pulse: 89  Resp: 18  Temp: 97.7 F (36.5 C)  SpO2: 95%    Focused abdominal exam: The periumbilical incision is well approximated.  There is no erythema, induration, or drainage present.  A/P: Doing well 3 weeks after undergoing a simple umbilical hernia repair.  No mesh was placed due to the small size of the defect.  He was cautioned to continue to abstain from lifting anything heavier than 10 pounds for another 3 weeks.  He is cleared to resume the rest of his normal activities.  At this time no further follow-up is scheduled, however he may return at any time for further evaluation if needed

## 2018-08-22 ENCOUNTER — Ambulatory Visit (INDEPENDENT_AMBULATORY_CARE_PROVIDER_SITE_OTHER): Payer: BLUE CROSS/BLUE SHIELD | Admitting: Internal Medicine

## 2018-08-22 ENCOUNTER — Encounter: Payer: Self-pay | Admitting: Internal Medicine

## 2018-08-22 VITALS — BP 150/92 | HR 88 | Temp 99.0°F | Ht 69.0 in | Wt 209.2 lb

## 2018-08-22 DIAGNOSIS — R739 Hyperglycemia, unspecified: Secondary | ICD-10-CM

## 2018-08-22 DIAGNOSIS — I10 Essential (primary) hypertension: Secondary | ICD-10-CM | POA: Diagnosis not present

## 2018-08-22 DIAGNOSIS — K760 Fatty (change of) liver, not elsewhere classified: Secondary | ICD-10-CM | POA: Diagnosis not present

## 2018-08-22 DIAGNOSIS — G4700 Insomnia, unspecified: Secondary | ICD-10-CM | POA: Insufficient documentation

## 2018-08-22 MED ORDER — AMLODIPINE BESYLATE 2.5 MG PO TABS: 2.5000 mg | ORAL_TABLET | Freq: Every day | ORAL | 3 refills | Status: DC

## 2018-08-22 NOTE — Patient Instructions (Addendum)
Norvasc 2.5 mg daily for blood pressure  Vitamin D3 5000 IU daily   Ask your wife when your last colonoscopy was.   Hypertension Hypertension, commonly called high blood pressure, is when the force of blood pumping through the arteries is too strong. The arteries are the blood vessels that carry blood from the heart throughout the body. Hypertension forces the heart to work harder to pump blood and may cause arteries to become narrow or stiff. Having untreated or uncontrolled hypertension can cause heart attacks, strokes, kidney disease, and other problems. A blood pressure reading consists of a higher number over a lower number. Ideally, your blood pressure should be below 120/80. The first ("top") number is called the systolic pressure. It is a measure of the pressure in your arteries as your heart beats. The second ("bottom") number is called the diastolic pressure. It is a measure of the pressure in your arteries as the heart relaxes. What are the causes? The cause of this condition is not known. What increases the risk? Some risk factors for high blood pressure are under your control. Others are not. Factors you can change  Smoking.  Having type 2 diabetes mellitus, high cholesterol, or both.  Not getting enough exercise or physical activity.  Being overweight.  Having too much fat, sugar, calories, or salt (sodium) in your diet.  Drinking too much alcohol. Factors that are difficult or impossible to change  Having chronic kidney disease.  Having a family history of high blood pressure.  Age. Risk increases with age.  Race. You may be at higher risk if you are African-American.  Gender. Men are at higher risk than women before age 16. After age 62, women are at higher risk than men.  Having obstructive sleep apnea.  Stress. What are the signs or symptoms? Extremely high blood pressure (hypertensive crisis) may cause:  Headache.  Anxiety.  Shortness of  breath.  Nosebleed.  Nausea and vomiting.  Severe chest pain.  Jerky movements you cannot control (seizures). How is this diagnosed? This condition is diagnosed by measuring your blood pressure while you are seated, with your arm resting on a surface. The cuff of the blood pressure monitor will be placed directly against the skin of your upper arm at the level of your heart. It should be measured at least twice using the same arm. Certain conditions can cause a difference in blood pressure between your right and left arms. Certain factors can cause blood pressure readings to be lower or higher than normal (elevated) for a short period of time:  When your blood pressure is higher when you are in a health care provider's office than when you are at home, this is called white coat hypertension. Most people with this condition do not need medicines.  When your blood pressure is higher at home than when you are in a health care provider's office, this is called masked hypertension. Most people with this condition may need medicines to control blood pressure. If you have a high blood pressure reading during one visit or you have normal blood pressure with other risk factors:  You may be asked to return on a different day to have your blood pressure checked again.  You may be asked to monitor your blood pressure at home for 1 week or longer. If you are diagnosed with hypertension, you may have other blood or imaging tests to help your health care provider understand your overall risk for other conditions. How is this treated?  This condition is treated by making healthy lifestyle changes, such as eating healthy foods, exercising more, and reducing your alcohol intake. Your health care provider may prescribe medicine if lifestyle changes are not enough to get your blood pressure under control, and if:  Your systolic blood pressure is above 130.  Your diastolic blood pressure is above 80. Your  personal target blood pressure may vary depending on your medical conditions, your age, and other factors. Follow these instructions at home: Eating and drinking   Eat a diet that is high in fiber and potassium, and low in sodium, added sugar, and fat. An example eating plan is called the DASH (Dietary Approaches to Stop Hypertension) diet. To eat this way: ? Eat plenty of fresh fruits and vegetables. Try to fill half of your plate at each meal with fruits and vegetables. ? Eat whole grains, such as whole wheat pasta, brown rice, or whole grain bread. Fill about one quarter of your plate with whole grains. ? Eat or drink low-fat dairy products, such as skim milk or low-fat yogurt. ? Avoid fatty cuts of meat, processed or cured meats, and poultry with skin. Fill about one quarter of your plate with lean proteins, such as fish, chicken without skin, beans, eggs, and tofu. ? Avoid premade and processed foods. These tend to be higher in sodium, added sugar, and fat.  Reduce your daily sodium intake. Most people with hypertension should eat less than 1,500 mg of sodium a day.  Limit alcohol intake to no more than 1 drink a day for nonpregnant women and 2 drinks a day for men. One drink equals 12 oz of beer, 5 oz of wine, or 1 oz of hard liquor. Lifestyle   Work with your health care provider to maintain a healthy body weight or to lose weight. Ask what an ideal weight is for you.  Get at least 30 minutes of exercise that causes your heart to beat faster (aerobic exercise) most days of the week. Activities may include walking, swimming, or biking.  Include exercise to strengthen your muscles (resistance exercise), such as pilates or lifting weights, as part of your weekly exercise routine. Try to do these types of exercises for 30 minutes at least 3 days a week.  Do not use any products that contain nicotine or tobacco, such as cigarettes and e-cigarettes. If you need help quitting, ask your  health care provider.  Monitor your blood pressure at home as told by your health care provider.  Keep all follow-up visits as told by your health care provider. This is important. Medicines  Take over-the-counter and prescription medicines only as told by your health care provider. Follow directions carefully. Blood pressure medicines must be taken as prescribed.  Do not skip doses of blood pressure medicine. Doing this puts you at risk for problems and can make the medicine less effective.  Ask your health care provider about side effects or reactions to medicines that you should watch for. Contact a health care provider if:  You think you are having a reaction to a medicine you are taking.  You have headaches that keep coming back (recurring).  You feel dizzy.  You have swelling in your ankles.  You have trouble with your vision. Get help right away if:  You develop a severe headache or confusion.  You have unusual weakness or numbness.  You feel faint.  You have severe pain in your chest or abdomen.  You vomit repeatedly.  You have  trouble breathing. Summary  Hypertension is when the force of blood pumping through your arteries is too strong. If this condition is not controlled, it may put you at risk for serious complications.  Your personal target blood pressure may vary depending on your medical conditions, your age, and other factors. For most people, a normal blood pressure is less than 120/80.  Hypertension is treated with lifestyle changes, medicines, or a combination of both. Lifestyle changes include weight loss, eating a healthy, low-sodium diet, exercising more, and limiting alcohol. This information is not intended to replace advice given to you by your health care provider. Make sure you discuss any questions you have with your health care provider. Document Released: 08/20/2005 Document Revised: 07/18/2016 Document Reviewed: 07/18/2016 Elsevier  Interactive Patient Education  2019 [employer]  Nonalcoholic Fatty Liver Disease Diet Nonalcoholic fatty liver disease is a condition that causes fat to accumulate in and around the liver. The disease makes it harder for the liver to work the way that it should. Following a healthy diet can help to keep nonalcoholic fatty liver disease under control. It can also help to prevent or improve conditions that are associated with the disease, such as heart disease, diabetes, high blood pressure, and abnormal cholesterol levels. Along with regular exercise, this diet:  Promotes weight loss.  Helps to control blood sugar levels.  Helps to improve the way that the body uses insulin. What do I need to know about this diet?  Use the glycemic index (GI) to plan your meals. The index tells you how quickly a food will raise your blood sugar. Choose low-GI foods. These foods take a longer time to raise blood sugar.  Keep track of how many calories you take in. Eating the right amount of calories will help you to achieve a healthy weight.  You may want to follow a Mediterranean diet. This diet includes a lot of vegetables, lean meats or fish, whole grains, fruits, and healthy oils and fats. What foods can I eat? Grains Whole grains, such as whole-wheat or whole-grain breads, crackers, tortillas, cereals, and pasta. Stone-ground whole wheat. Pumpernickel bread. Unsweetened oatmeal. Bulgur. Barley. Quinoa. Brown or wild rice. Corn or whole-wheat flour tortillas. Vegetables Lettuce. Spinach. Peas. Beets. Cauliflower. Cabbage. Broccoli. Carrots. Tomatoes. Squash. Eggplant. Herbs. Peppers. Onions. Cucumbers. Brussels sprouts. Yams and sweet potatoes. Beans. Lentils. Fruits Bananas. Apples. Oranges. Grapes. Papaya. Mango. Pomegranate. Kiwi. Grapefruit. Cherries. Meats and Other Protein Sources Seafood and shellfish. Lean meats. Poultry. Tofu. Dairy Low-fat or fat-free dairy products, such as yogurt, cottage  cheese, and cheese. Beverages Water. Sugar-free drinks. Tea. Coffee. Low-fat or skim milk. Milk alternatives, such as soy or almond milk. Real fruit juice. Condiments Mustard. Relish. Low-fat, low-sugar ketchup and barbecue sauce. Low-fat or fat-free mayonnaise. Sweets and Desserts Sugar-free sweets. Fats and Oils Avocado. Canola or olive oil. Nuts and nut butters. Seeds. The items listed above may not be a complete list of recommended foods or beverages. Contact your dietitian for more options. What foods are not recommended? Palm oil and coconut oil. Processed foods. Fried foods. Sweetened drinks, such as sweet tea, milkshakes, snow cones, iced sweet drinks, and sodas. Alcohol. Sweets. Foods that contain a lot of salt or sodium. The items listed above may not be a complete list of foods and beverages to avoid. Contact your dietitian for more information. This information is not intended to replace advice given to you by your health care provider. Make sure you discuss any questions you have with your  health care provider. Document Released: 01/04/2015 Document Revised: 01/26/2016 Document Reviewed: 09/14/2014 Elsevier Interactive Patient Education  2019 [employer]  Fatty Liver Disease  Fatty liver disease occurs when too much fat has built up in your liver cells. Fatty liver disease is also called hepatic steatosis or steatohepatitis. The liver removes harmful substances from your bloodstream and produces fluids that your body needs. It also helps your body use and store energy from the food you eat. In many cases, fatty liver disease does not cause symptoms or problems. It is often diagnosed when tests are being done for other reasons. However, over time, fatty liver can cause inflammation that may lead to more serious liver problems, such as scarring of the liver (cirrhosis) and liver failure. Fatty liver is associated with insulin resistance, increased body fat, high blood pressure  (hypertension), and high cholesterol. These are features of metabolic syndrome and increase your risk for stroke, diabetes, and heart disease. What are the causes? This condition may be caused by:  Drinking too much alcohol.  Poor nutrition.  Obesity.  Cushing's syndrome.  Diabetes.  High cholesterol.  Certain drugs.  Poisons.  Some viral infections.  Pregnancy. What increases the risk? You are more likely to develop this condition if you:  Abuse alcohol.  Are overweight.  Have diabetes.  Have hepatitis.  Have a high triglyceride level.  Are pregnant. What are the signs or symptoms? Fatty liver disease often does not cause symptoms. If symptoms do develop, they can include:  Fatigue.  Weakness.  Weight loss.  Confusion.  Abdominal pain.  Nausea and vomiting.  Yellowing of your skin and the white parts of your eyes (jaundice).  Itchy skin. How is this diagnosed? This condition may be diagnosed by:  A physical exam and medical history.  Blood tests.  Imaging tests, such as an ultrasound, CT scan, or MRI.  A liver biopsy. A small sample of liver tissue is removed using a needle. The sample is then looked at under a microscope. How is this treated? Fatty liver disease is often caused by other health conditions. Treatment for fatty liver may involve medicines and lifestyle changes to manage conditions such as:  Alcoholism.  High cholesterol.  Diabetes.  Being overweight or obese. Follow these instructions at home:   Do not drink alcohol. If you have trouble quitting, ask your health care provider how to safely quit with the help of medicine or a supervised program. This is important to keep your condition from getting worse.  Eat a healthy diet as told by your health care provider. Ask your health care provider about working with a diet and nutrition specialist (dietitian) to develop an eating plan.  Exercise regularly. This can help you  lose weight and control your cholesterol and diabetes. Talk to your health care provider about an exercise plan and which activities are best for you.  Take over-the-counter and prescription medicines only as told by your health care provider.  Keep all follow-up visits as told by your health care provider. This is important. Contact a health care provider if: You have trouble controlling your:  Blood sugar. This is especially important if you have diabetes.  Cholesterol.  Drinking of alcohol. Get help right away if:  You have abdominal pain.  You have jaundice.  You have nausea and vomiting.  You vomit blood or material that looks like coffee grounds.  You have stools that are black, tar-like, or bloody. Summary  Fatty liver disease develops when too  much fat builds up in the cells of your liver.  Fatty liver disease often causes no symptoms or problems. However, over time, fatty liver can cause inflammation that may lead to more serious liver problems, such as scarring of the liver (cirrhosis).  You are more likely to develop this condition if you abuse alcohol, are pregnant, are overweight, have diabetes, have hepatitis, or have high triglyceride levels.  Contact your health care provider if you have trouble controlling your weight, blood sugar, cholesterol, or drinking of alcohol. This information is not intended to replace advice given to you by your health care provider. Make sure you discuss any questions you have with your health care provider. Document Released: 10/05/2005 Document Revised: 05/29/2017 Document Reviewed: 05/29/2017 Elsevier Interactive Patient Education  2019 [employer].     Amlodipine tablets What is this medicine? AMLODIPINE (am LOE di peen) is a calcium-channel blocker. It affects the amount of calcium found in your heart and muscle cells. This relaxes your blood vessels, which can reduce the amount of work the heart has to do. This medicine is  used to lower high blood pressure. It is also used to prevent chest pain. This medicine may be used for other purposes; ask your health care provider or pharmacist if you have questions. COMMON BRAND NAME(S): Norvasc What should I tell my health care provider before I take this medicine? They need to know if you have any of these conditions: -heart disease -liver disease -an unusual or allergic reaction to amlodipine, other medicines, foods, dyes, or preservatives -pregnant or trying to get pregnant -breast-feeding How should I use this medicine? Take this medicine by mouth with a glass of water. Follow the directions on the prescription label. You can take it with or without food. If it upsets your stomach, take it with food. Take your medicine at regular intervals. Do not take it more often than directed. Do not stop taking except on your doctor's advice. Talk to your pediatrician regarding the use of this medicine in children. While this drug may be prescribed for children as young as 6 years for selected conditions, precautions do apply. Patients over 60 years of age may have a stronger reaction and need a smaller dose. Overdosage: If you think you have taken too much of this medicine contact a poison control center or emergency room at once. NOTE: This medicine is only for you. Do not share this medicine with others. What if I miss a dose? If you miss a dose, take it as soon as you can. If it is almost time for your next dose, take only that dose. Do not take double or extra doses. What may interact with this medicine? Do not take this medicine with any of the following medications: -tranylcypromine This medicine may also interact with the following medications: -clarithromycin -cyclosporine -diltiazem -itraconazole -simvastatin -tacrolimus This list may not describe all possible interactions. Give your health care provider a list of all the medicines, herbs, non-prescription drugs,  or dietary supplements you use. Also tell them if you smoke, drink alcohol, or use illegal drugs. Some items may interact with your medicine. What should I watch for while using this medicine? Visit your healthcare professional for regular checks on your progress. Check your blood pressure as directed. Ask your healthcare professional what your blood pressure should be and when you should contact him or her. Do not treat yourself for coughs, colds, or pain while you are using this medicine without asking your healthcare professional  for advice. Some medicines may increase your blood pressure. You may get dizzy. Do not drive, use machinery, or do anything that needs mental alertness until you know how this medicine affects you. Do not stand or sit up quickly, especially if you are an older patient. This reduces the risk of dizzy or fainting spells. Avoid alcoholic drinks; they can make you dizzier. What side effects may I notice from receiving this medicine? Side effects that you should report to your doctor or health care professional as soon as possible: -allergic reactions like skin rash, itching or hives; swelling of the face, lips, or tongue -fast, irregular heartbeat -signs and symptoms of low blood pressure like dizziness; feeling faint or lightheaded, falls; unusually weak or tired -swelling of ankles, feet, hands Side effects that usually do not require medical attention (report these to your doctor or health care professional if they continue or are bothersome): -dry mouth -facial flushing -headache -stomach pain -tiredness This list may not describe all possible side effects. Call your doctor for medical advice about side effects. You may report side effects to FDA at 1-800-FDA-1088. Where should I keep my medicine? Keep out of the reach of children. Store at room temperature between 59 and 86 degrees F (15 and 30 degrees C). Throw away any unused medicine after the expiration  date. NOTE: This sheet is a summary. It may not cover all possible information. If you have questions about this medicine, talk to your doctor, pharmacist, or health care provider.  2019 Elsevier/Gold Standard (2018-03-14 15:07:10)

## 2018-08-22 NOTE — Progress Notes (Addendum)
Chief Complaint  Patient presents with  . Follow-up   F/u  1. S/p hernia abdominal repair Dr. Lady Garyannon doing well  2. HTN never been on BP meds but BP elevated on multiple occasions  3. Fatty liver noted on CT ab/pelvis 07/10/18 disc exercise to lose wt need to check lipid in future  4. Insomnia he reports he sleeps 2 hours at a time and wakes up early long term this is not a problem for him   Review of Systems  Constitutional: Negative for weight loss.  HENT: Negative for hearing loss.   Eyes: Negative for blurred vision.  Respiratory: Negative for shortness of breath.   Cardiovascular: Negative for chest pain.  Gastrointestinal: Negative for abdominal pain.  Skin: Negative for rash.  Neurological: Negative for headaches.  Psychiatric/Behavioral: Negative for depression. The patient has insomnia.        Sleeps 2 hours at a time long term declines medications     Past Medical History:  Diagnosis Date  . Blood in stool    on and off since 2014   . Elevated blood pressure reading   . Fatty liver   . Heart murmur   . Hyperlipidemia   . Hypertension    Past Surgical History:  Procedure Laterality Date  . left knee surgery Left    arthroscopy  . UMBILICAL HERNIA REPAIR N/A 07/30/2018   Procedure: HERNIA REPAIR UMBILICAL ADULT;  Surgeon: Duanne Guessannon, Jennifer, MD;  Location: [facility] ORS;  Service: General;  Laterality: N/A;   Family History  Problem Relation Age of Onset  . Parkinson's disease Mother    Social History   Socioeconomic History  . Marital status: Married    Spouse name: Not on file  . Number of children: Not on file  . Years of education: Not on file  . Highest education level: Not on file  Occupational History  . Not on file  Social Needs  . Financial resource strain: Not on file  . Food insecurity:    Worry: Not on file    Inability: Not on file  . Transportation needs:    Medical: Not on file    Non-medical: Not on file  Tobacco Use  . Smoking status:  Former Smoker    Types: Cigarettes  . Smokeless tobacco: Never Used  . Tobacco comment: light   Substance and Sexual Activity  . Alcohol use: Yes    Comment: rarely  . Drug use: Not Currently  . Sexual activity: Yes  Lifestyle  . Physical activity:    Days per week: Not on file    Minutes per session: Not on file  . Stress: Not on file  Relationships  . Social connections:    Talks on phone: Not on file    Gets together: Not on file    Attends religious service: Not on file    Active member of club or organization: Not on file    Attends meetings of clubs or organizations: Not on file    Relationship status: Not on file  . Intimate partner violence:    Fear of current or ex partner: Not on file    Emotionally abused: Not on file    Physically abused: Not on file    Forced sexual activity: Not on file  Other Topics Concern  . 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 ibuprofen (ADVIL,MOTRIN) 800 MG tablet Take 1 tablet (800 mg total) by mouth every 8 (eight) hours as needed.   Allergies  Allergen Reactions  . Statins Other (See Comments)    Knees ache, chest felt funny   Recent Results (from the past 2160 hour(s))  Comprehensive metabolic panel     Status: Abnormal   Collection Time: 07/10/18  8:35 AM  Result Value Ref Range   Sodium 138 135 - 145 mEq/L   Potassium 4.3 3.5 - 5.1 mEq/L   Chloride 101 96 - 112 mEq/L   CO2 31 19 - 32 mEq/L   Glucose, Bld 102 (H) 70 - 99 mg/dL   BUN 26 (H) 6 - 23 mg/dL   Creatinine, Ser 1.611.11 0.40 - 1.50 mg/dL   Total Bilirubin 0.4 0.2 - 1.2 mg/dL   Alkaline Phosphatase 59 39 - 117 U/L   AST 17 0 - 37 U/L   ALT 25 0 - 53 U/L   Total Protein 7.2 6.0 - 8.3 g/dL   Albumin 4.5 3.5 - 5.2 g/dL   Calcium 9.7 8.4 - 09.610.5 mg/dL   GFR 04.5471.67 >09.81>60.00 mL/min  CBC with  Differential/Platelet     Status: Abnormal   Collection Time: 07/10/18  8:35 AM  Result Value Ref Range   WBC 5.8 4.0 - 10.5 K/uL   RBC 5.41 4.22 - 5.81 Mil/uL   Hemoglobin 16.5 13.0 - 17.0 g/dL   HCT 19.148.5 47.839.0 - 29.552.0 %   MCV 89.6 78.0 - 100.0 fl   MCHC 34.1 30.0 - 36.0 g/dL   RDW 62.113.6 30.811.5 - 65.715.5 %   Platelets 240.0 150.0 - 400.0 K/uL   Neutrophils Relative % 40.3 (L) 43.0 - 77.0 %   Lymphocytes Relative 45.4 12.0 - 46.0 %   Monocytes Relative 9.7 3.0 - 12.0 %   Eosinophils Relative 4.1 0.0 - 5.0 %   Basophils Relative 0.5 0.0 - 3.0 %   Neutro Abs 2.3 1.4 - 7.7 K/uL   Lymphs Abs 2.6 0.7 - 4.0 K/uL   Monocytes Absolute 0.6 0.1 - 1.0 K/uL   Eosinophils Absolute 0.2 0.0 - 0.7 K/uL   Basophils Absolute 0.0 0.0 - 0.1 K/uL  Urinalysis, Routine w reflex microscopic     Status: None   Collection Time: 07/10/18  8:35 AM  Result Value Ref Range   Color, Urine YELLOW YELLOW   APPearance CLEAR CLEAR   Specific Gravity, Urine 1.020 1.001 - 1.03   pH 7.5 5.0 - 8.0   Glucose, UA NEGATIVE NEGATIVE   Bilirubin Urine NEGATIVE NEGATIVE   Ketones, ur NEGATIVE NEGATIVE   Hgb urine dipstick NEGATIVE NEGATIVE   Protein, ur NEGATIVE NEGATIVE   Nitrite NEGATIVE NEGATIVE   Leukocytes, UA NEGATIVE NEGATIVE  TSH     Status: None   Collection Time: 07/10/18  8:35 AM  Result Value Ref Range   TSH 0.99 0.35 - 4.50 uIU/mL  Vitamin D (25 hydroxy)     Status: Abnormal   Collection Time: 07/10/18  8:35 AM  Result Value Ref Range   VITD 26.30 (L) 30.00 - 100.00 ng/mL  Hepatitis C antibody     Status: None   Collection Time: 07/10/18  8:35 AM  Result Value Ref Range   Hepatitis C Ab NON-REACTIVE NON-REACTI   SIGNAL TO CUT-OFF 0.04 <1.00    Comment: . HCV antibody was non-reactive. There is  no laboratory  evidence of HCV infection. . In most cases, no further action is required. However, if recent HCV exposure is suspected, a test for HCV RNA (test code 16109) is suggested. . For additional  information please refer to http://education.[facility].com/faq/FAQ22v1 (This link is being provided for informational/ educational purposes only.) .   Hepatitis B surface antibody,quantitative     Status: Abnormal   Collection Time: 07/10/18  8:35 AM  Result Value Ref Range   Hepatitis B-Post <5 (L) > OR = 10 mIU/mL    Comment: . Patient does not have immunity to hepatitis B virus. . For additional information, please refer to http://education.[facility].com/faq/FAQ105 (This link is being provided for informational/ educational purposes only).   Hepatitis A Ab, Total     Status: None   Collection Time: 07/10/18  8:35 AM  Result Value Ref Range   Hepatitis A AB,Total NON-REACTIVE NON-REACTI    Comment: For additional information, please refer to  http://education.[facility].com/faq/FAQ202  (This link is being provided for informational/ educational purposes only.)   Hepatitis B surface antigen     Status: None   Collection Time: 07/10/18  8:35 AM  Result Value Ref Range   Hepatitis B Surface Ag NON-REACTIVE NON-REACTI  PSA     Status: None   Collection Time: 07/10/18  8:35 AM  Result Value Ref Range   PSA 1.22 0.10 - 4.00 ng/mL    Comment: Test performed using Access Hybritech PSA Assay, a parmagnetic partical, chemiluminecent immunoassay.  Surgical pathology     Status: None   Collection Time: 07/30/18  9:30 AM  Result Value Ref Range   SURGICAL PATHOLOGY      Surgical Pathology CASE: [id number] PATIENT: Laquincy Wagenaar Surgical Pathology Report     SPECIMEN SUBMITTED: A. Hernia sac  CLINICAL HISTORY: None provided  PRE-OPERATIVE DIAGNOSIS: Umbilical hernia  POST-OPERATIVE DIAGNOSIS: Same as pre-op     DIAGNOSIS: A.  SOFT TISSUE, UMBILICAL AREA; HERNIA REPAIR: - FIBROADIPOSE TISSUE AND DERMAL TISSUE,  CONSISTENT WITH HERNIA SAC.  GROSS DESCRIPTION: A. Labeled: Hernia sac Received: Formalin Tissue fragment(s): 1 Size: 1.7 x  1.0 x 0.5 cm Description: Irregular fragment of tan-pink, fibromembranous tissue.  No abnormalities are grossly identified. Representative sections are submitted in cassette 1.   Final Diagnosis performed by Ronald Lobo, MD.   Electronically signed 08/01/2018 3:45:55PM The electronic signature indicates that the named Attending Pathologist has evaluated the specimen  Technical component performed at [facility], [address] [place], [place] [address] Lab: [phone] Dir: Sue Lush, MD, MMM  Professional component performed at [facility], [facility], [address] [place], [place], [place] [address] Lab: [phone] Dir: Georgiann Cocker. Rubinas, MD    Objective  Body mass index is 30.89 kg/m. Wt Readings from Last 3 Encounters:  08/22/18 209 lb 3.2 oz (94.9 kg)  08/20/18 208 lb (94.3 kg)  07/30/18 205 lb (93 kg)   Temp Readings from Last 3 Encounters:  08/22/18 99 F (37.2 C) (Oral)  08/20/18 97.7 F (36.5 C) (Skin)  07/30/18 97.8 F (36.6 C)   BP Readings from Last 3 Encounters:  08/22/18 (!) 150/92  08/20/18 (!) 137/92  07/30/18 137/84   Pulse Readings from Last 3 Encounters:  08/22/18 88  08/20/18 89  07/30/18 80    Physical Exam Vitals signs and nursing note reviewed.  Constitutional:      Appearance: Normal appearance. He is obese.  HENT:     Head: Normocephalic and atraumatic.     Mouth/Throat:  Mouth: Mucous membranes are moist.     Pharynx: Oropharynx is clear.  Eyes:     Conjunctiva/sclera: Conjunctivae normal.     Pupils: Pupils are equal, round, and reactive to light.  Cardiovascular:     Rate and Rhythm: Normal rate and regular rhythm.     Heart sounds: Normal heart sounds.  Pulmonary:     Effort: Pulmonary effort is normal.     Breath sounds: Normal breath sounds.  Abdominal:     Comments: Scar above abdomen    Skin:    General: Skin is warm and dry.  Neurological:     General: No focal deficit present.     Mental Status: He  is alert and oriented to person, place, and time.     Gait: Gait normal.  Psychiatric:        Attention and Perception: Attention and perception normal.        Mood and Affect: Mood and affect normal.        Speech: Speech normal.        Behavior: Behavior normal. Behavior is cooperative.        Thought Content: Thought content normal.        Cognition and Memory: Cognition and memory normal.        Judgment: Judgment normal.     Assessment   1. Abdominal hernia s/p repair doing well  2. HTN of note sent note from insurance pt noncompliant with norvasc 3. Fatty liver  4. Insomnia  5. HM Plan   1. Doing well ntd  2. Add norvsc 2.5 mg qd  Check lipid, A1C, cmet in future  3. Will rec hep A/B vaccine in future  4. Disc warm milk declines medications for sleep 5.  Declines flu shot  rec Tdap vaccine in future  Disc shingrix at f/u   PSA 1.22 normal consider DRE in future  Will likely need repeat colonoscopy get records [facility], labs stress test, notes from [facility] did not have at [facility] his records  Provider: Dr. French Ana McLean-Scocuzza-Internal Medicine

## 2018-08-22 NOTE — Progress Notes (Signed)
Pre visit review using our clinic review tool, if applicable. No additional management support is needed unless otherwise documented below in the visit note. 

## 2018-11-21 ENCOUNTER — Other Ambulatory Visit: Payer: Self-pay

## 2018-11-21 ENCOUNTER — Other Ambulatory Visit (INDEPENDENT_AMBULATORY_CARE_PROVIDER_SITE_OTHER): Payer: BLUE CROSS/BLUE SHIELD

## 2018-11-21 DIAGNOSIS — R739 Hyperglycemia, unspecified: Secondary | ICD-10-CM | POA: Diagnosis not present

## 2018-11-21 DIAGNOSIS — I10 Essential (primary) hypertension: Secondary | ICD-10-CM | POA: Diagnosis not present

## 2018-11-21 LAB — HEMOGLOBIN A1C: HEMOGLOBIN A1C: 6.1 % (ref 4.6–6.5)

## 2018-11-21 LAB — COMPREHENSIVE METABOLIC PANEL
ALBUMIN: 4.3 g/dL (ref 3.5–5.2)
ALT: 26 U/L (ref 0–53)
AST: 21 U/L (ref 0–37)
Alkaline Phosphatase: 67 U/L (ref 39–117)
BUN: 29 mg/dL — AB (ref 6–23)
CALCIUM: 9.9 mg/dL (ref 8.4–10.5)
CHLORIDE: 101 meq/L (ref 96–112)
CO2: 30 meq/L (ref 19–32)
CREATININE: 1.24 mg/dL (ref 0.40–1.50)
GFR: 59.27 mL/min — ABNORMAL LOW (ref >60.00)
GLUCOSE: 100 mg/dL — AB (ref 70–99)
POTASSIUM: 4.9 meq/L (ref 3.5–5.1)
SODIUM: 139 meq/L (ref 135–145)
TOTAL PROTEIN: 7 g/dL (ref 6.0–8.3)
Total Bilirubin: 0.6 mg/dL (ref 0.2–1.2)

## 2018-11-21 LAB — LIPID PANEL
CHOLESTEROL: 270 mg/dL — AB (ref 0–200)
HDL: 40.4 mg/dL (ref >39.00)
NonHDL: 230.09
TRIGLYCERIDES: 265 mg/dL — AB (ref 0.0–149.0)
Total CHOL/HDL Ratio: 7
VLDL: 53 mg/dL — AB (ref 0.0–40.0)

## 2018-11-21 LAB — LDL CHOLESTEROL, DIRECT: Direct LDL: 165 mg/dL

## 2018-11-23 ENCOUNTER — Encounter: Payer: Self-pay | Admitting: Internal Medicine

## 2018-11-23 DIAGNOSIS — R7303 Prediabetes: Secondary | ICD-10-CM | POA: Insufficient documentation

## 2018-11-23 DIAGNOSIS — I152 Hypertension secondary to endocrine disorders: Secondary | ICD-10-CM | POA: Insufficient documentation

## 2018-11-24 ENCOUNTER — Other Ambulatory Visit: Payer: Self-pay | Admitting: Internal Medicine

## 2018-11-24 DIAGNOSIS — E785 Hyperlipidemia, unspecified: Secondary | ICD-10-CM

## 2018-11-24 MED ORDER — EZETIMIBE 10 MG PO TABS: 10.0000 mg | ORAL_TABLET | Freq: Every day | ORAL | 3 refills | Status: DC

## 2018-11-28 ENCOUNTER — Ambulatory Visit: Payer: BLUE CROSS/BLUE SHIELD | Admitting: Internal Medicine

## 2018-12-03 ENCOUNTER — Other Ambulatory Visit: Payer: Self-pay

## 2018-12-03 ENCOUNTER — Ambulatory Visit (INDEPENDENT_AMBULATORY_CARE_PROVIDER_SITE_OTHER): Payer: BLUE CROSS/BLUE SHIELD | Admitting: Internal Medicine

## 2018-12-03 ENCOUNTER — Encounter: Payer: Self-pay | Admitting: Internal Medicine

## 2018-12-03 DIAGNOSIS — E785 Hyperlipidemia, unspecified: Secondary | ICD-10-CM | POA: Diagnosis not present

## 2018-12-03 DIAGNOSIS — I10 Essential (primary) hypertension: Secondary | ICD-10-CM | POA: Diagnosis not present

## 2018-12-03 NOTE — Progress Notes (Signed)
Virtual Visit via Video Note but sound via telephone   I connected with Marc Myers on 12/03/18 at  9:35-9:50 AM EDT by a video enabled telemedicine application and verified that I am speaking with the correct person using two identifiers.  Location patient: home Location provider:work  Persons participating in the virtual visit: patient, provider, pts wife Clydie Braun  I discussed the limitations of evaluation and management by telemedicine and the availability of in person appointments. The patient expressed understanding and agreed to proceed.   HPI:  1. HTN no BP meter at home on norvasc 2.5 mg qd advised pt to purchase BP meter he does not have but reports he is compliant with medication. Initially it made him dizzy now no side effects and talking daily   2. HLD taking zetia 10 mg qd not side effects   3. S/p umbilical hernia surgery doing well no issues   ROS: See pertinent positives and negatives per HPI.  Past Medical History:  Diagnosis Date  . Blood in stool    on and off since 2014   . Elevated blood pressure reading   . Fatty liver   . Heart murmur   . Hyperlipidemia   . Hypertension     Past Surgical History:  Procedure Laterality Date  . left knee surgery Left    arthroscopy  . UMBILICAL HERNIA REPAIR N/A 07/30/2018   Procedure: HERNIA REPAIR UMBILICAL ADULT;  Surgeon: Duanne Guess, MD;  Location: [facility] ORS;  Service: General;  Laterality: N/A;    Family History  Problem Relation Age of Onset  . Parkinson's disease Mother     SOCIAL HX: married, owns his own business   Current Outpatient Medications:  .  amLODipine (NORVASC) 2.5 MG tablet, Take 1 tablet (2.5 mg total) by mouth daily. In am, Disp: 90 tablet, Rfl: 3 .  ezetimibe (ZETIA) 10 MG tablet, Take 1 tablet (10 mg total) by mouth daily., Disp: 90 tablet, Rfl: 3  EXAM:  VITALS per patient if applicable:  GENERAL: alert, oriented, appears well and in no acute distress  HEENT: atraumatic,  conjunttiva clear, no obvious abnormalities on inspection of external nose and ears  NECK: normal movements of the head and neck  LUNGS: on inspection no signs of respiratory distress, breathing rate appears normal, no obvious gross SOB, gasping or wheezing  CV: no obvious cyanosis  MS: moves all visible extremities without noticeable abnormality  PSYCH/NEURO: pleasant and cooperative, no obvious depression or anxiety, speech and thought processing grossly intact  ASSESSMENT AND PLAN:  Discussed the following assessment and plan:  Hyperlipidemia, unspecified hyperlipidemia type  Hypertension, unspecified type Cont norvasc 2.5 mg qd and zetia 10 mg qd  rec compliance with medication and healthy diet and exercise  F/u 3-6 months will need re eval BP goal <130/<90 and fasting labs    I discussed the assessment and treatment plan with the patient. The patient was provided an opportunity to ask questions and all were answered. The patient agreed with the plan and demonstrated an understanding of the instructions.   The patient was advised to call back or seek an in-person evaluation if the symptoms worsen or if the condition fails to improve as anticipated.  I provided 15 minutes of non-face-to-face time during this encounter.   Pasty Spillers McLean-Scocuzza, MD

## 2018-12-10 ENCOUNTER — Telehealth: Payer: Self-pay | Admitting: Internal Medicine

## 2018-12-10 NOTE — Telephone Encounter (Signed)
sch am f/u 06/2019 and he needs to come fasting for labs   Thanks TMS

## 2018-12-23 NOTE — Telephone Encounter (Signed)
Pt has been scheduled. Thank you.

## 2019-06-02 IMAGING — CT CT ABD-PELV W/ CM
1 of 3 series · 14 of 32 positions shown, 19 images · IV contrast (APPLIED)
Comparison: None.

CLINICAL DATA: Abdominal pain for 4 months. Patient believes there
is a hernia.

Insert tech call report
EXAM:
CT ABDOMEN AND PELVIS WITH CONTRAST
TECHNIQUE: Multidetector CT imaging of the abdomen and pelvis was performed
using the standard protocol following bolus administration of
intravenous contrast.
CONTRAST:  100mL OMNIPAQUE IOHEXOL 300 MG/ML  SOLN

[Series 2: axial st · axial · 0.75mm/px · z∈[-1071,-606]mm · 14 of 105 slices shown, 19 images]
[im 6/105  soft-tissue]
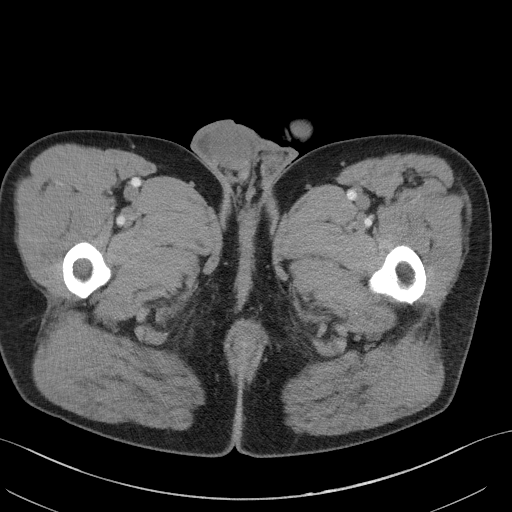
[im 6/105  bone]
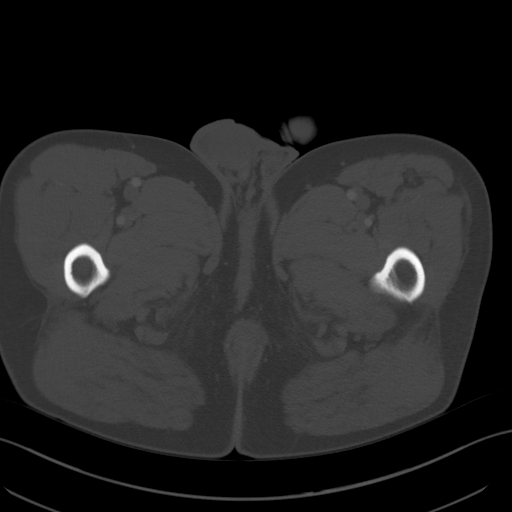
[im 12/105  soft-tissue]
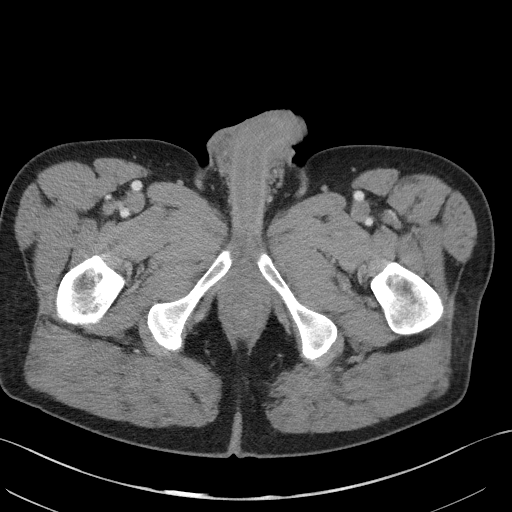
[im 24/105  soft-tissue]
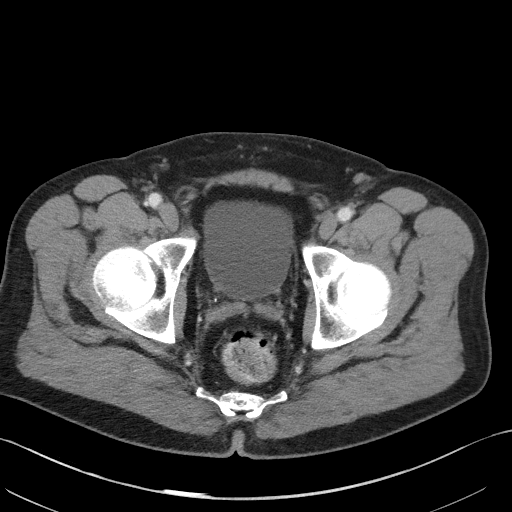
[im 29/105  soft-tissue]
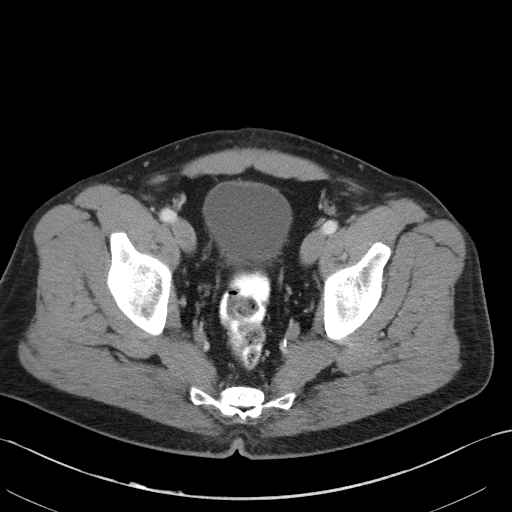
[im 35/105  soft-tissue]
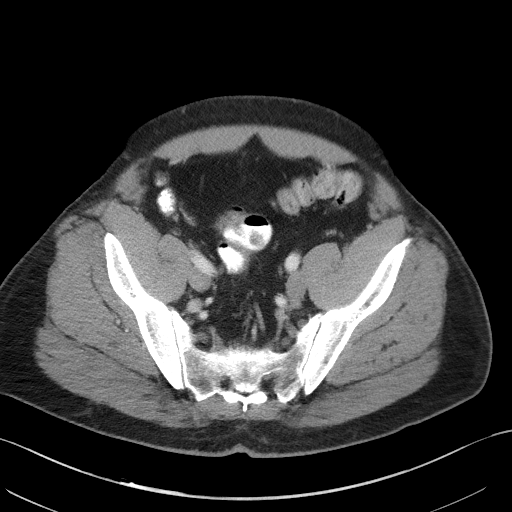
[im 47/105  soft-tissue]
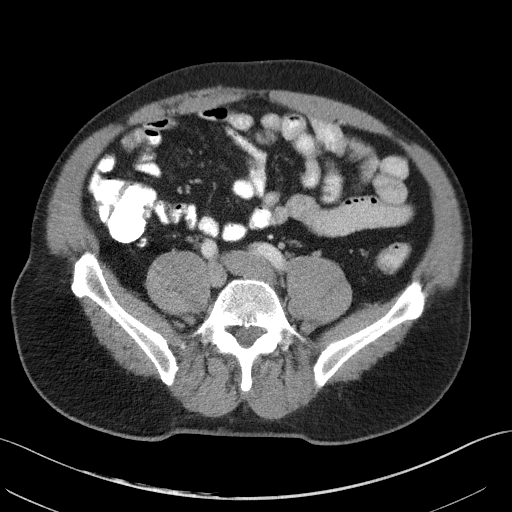
[im 53/105  soft-tissue]
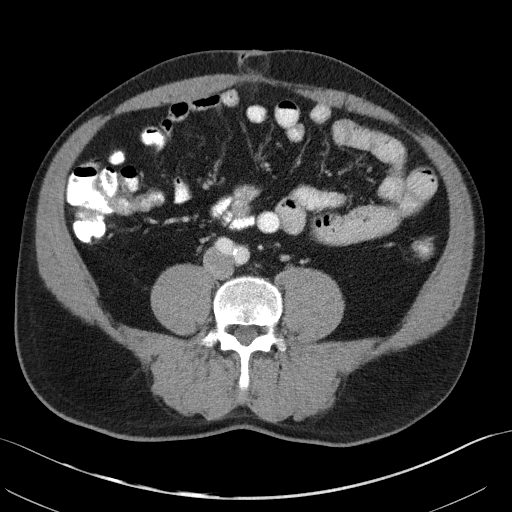
[im 58/105  soft-tissue]
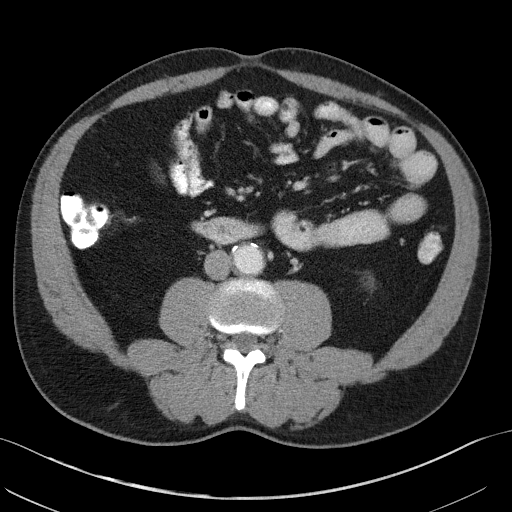
[im 70/105  soft-tissue]
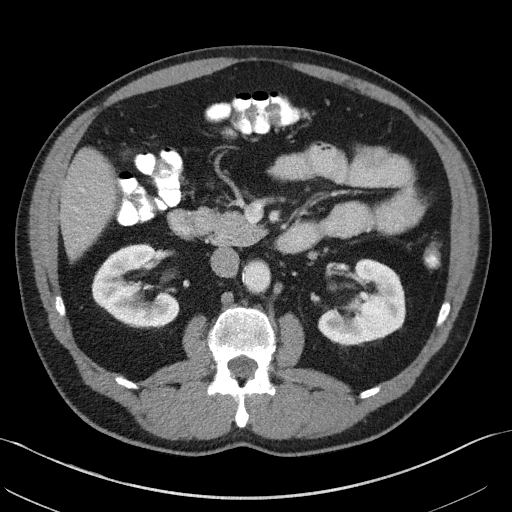
[im 70/105  bone]
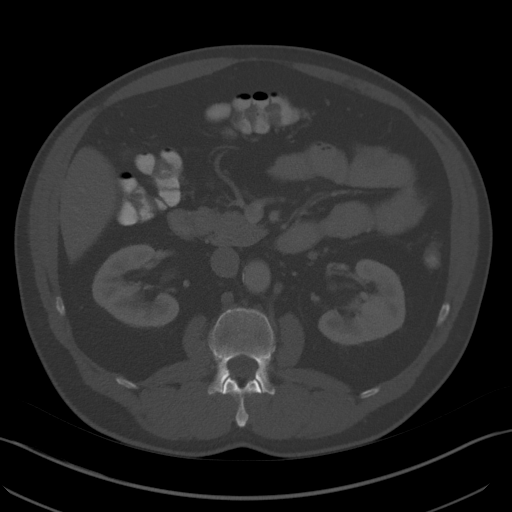
[im 76/105  soft-tissue]
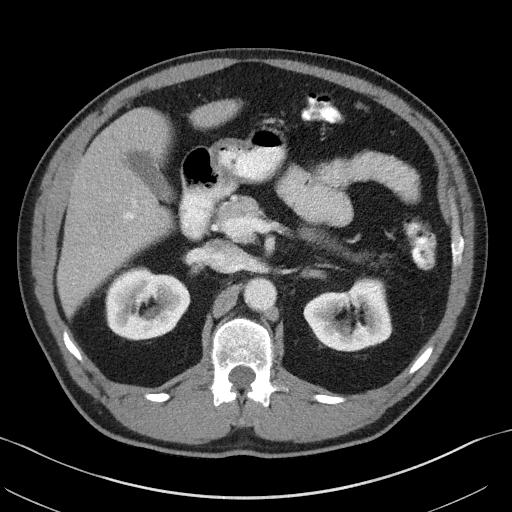
[im 81/105  soft-tissue]
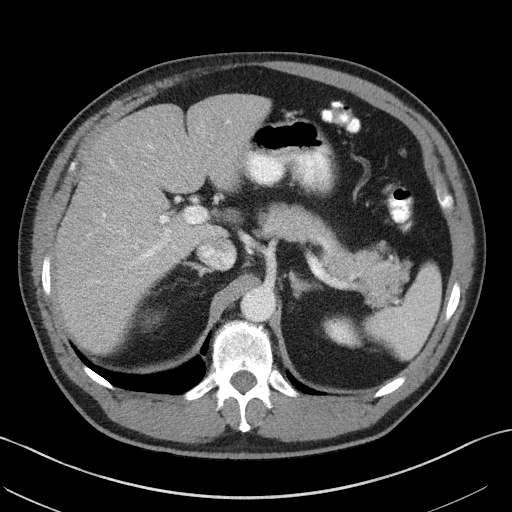
[im 81/105  lung]
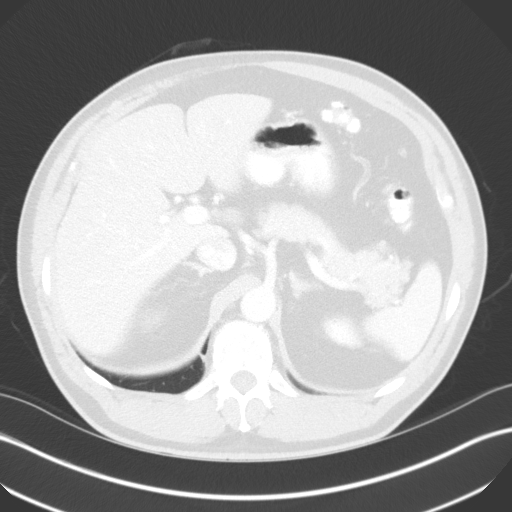
[im 87/105  lung]
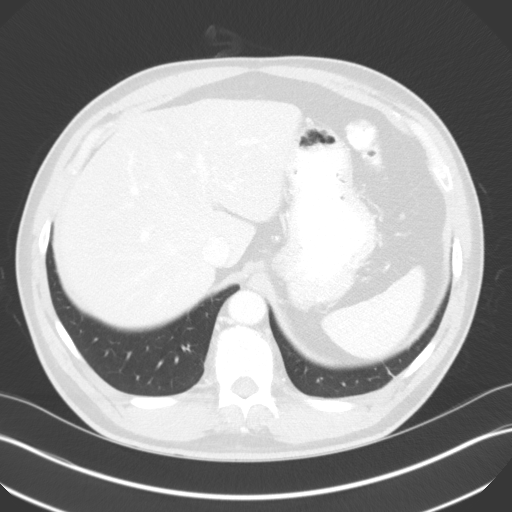
[im 93/105  soft-tissue]
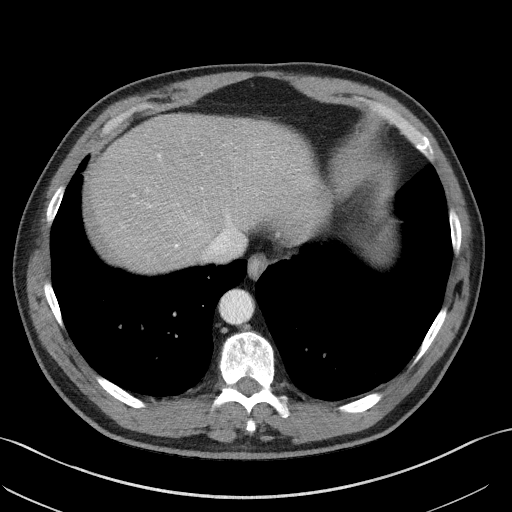
[im 93/105  lung]
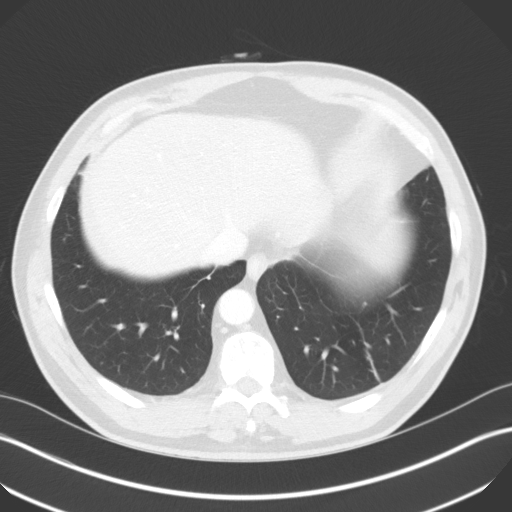
[im 99/105  soft-tissue]
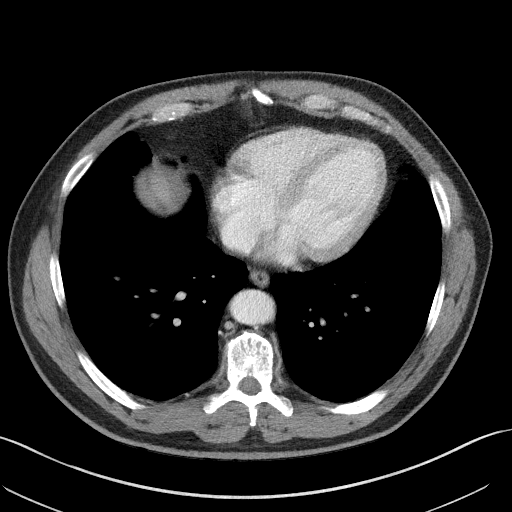
[im 99/105  lung]
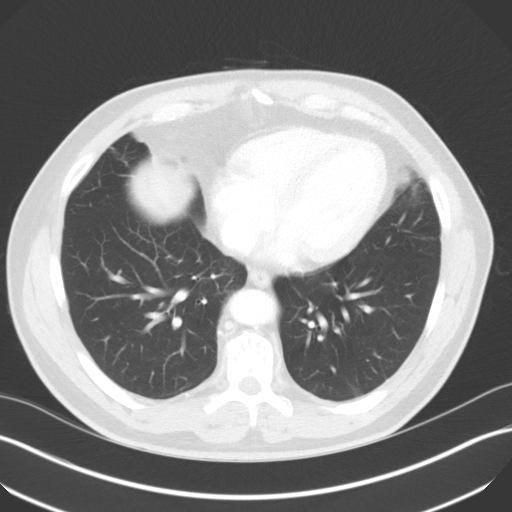

[14 of 32 positions shown; findings below may reference images not displayed]

FINDINGS: Lower chest:  Negative

Hepatobiliary: Small probable cyst in the left liver. Mild hepatic
steatosis.No evidence of biliary obstruction or stone.

Pancreas: Unremarkable.

Spleen: Unremarkable.

Adrenals/Urinary Tract: Negative adrenals. No hydronephrosis or
stone. Small renal sinus cysts on the left. Unremarkable bladder.

Stomach/Bowel: No obstruction. No appendicitis. Rare colonic
diverticula.

Vascular/Lymphatic: No acute vascular abnormality. Atherosclerotic
calcification of the mildly tortuous aorta. No mass or adenopathy.

Reproductive:Negative

Other: No ascites or pneumoperitoneum. Small fatty umbilical hernia
without complicating feature.

Musculoskeletal: No acute abnormalities.
IMPRESSION: 1. Small fatty umbilical hernia without complicating feature.
2. Aortic atherosclerosis.
3. Possible hepatic steatosis.

## 2019-06-11 ENCOUNTER — Ambulatory Visit: Payer: BLUE CROSS/BLUE SHIELD | Admitting: Internal Medicine

## 2019-08-31 ENCOUNTER — Ambulatory Visit: Payer: BC Managed Care – PPO | Attending: Internal Medicine

## 2019-08-31 DIAGNOSIS — Z20828 Contact with and (suspected) exposure to other viral communicable diseases: Secondary | ICD-10-CM | POA: Diagnosis not present

## 2019-08-31 DIAGNOSIS — Z20822 Contact with and (suspected) exposure to covid-19: Secondary | ICD-10-CM

## 2019-09-02 LAB — NOVEL CORONAVIRUS, NAA: SARS-CoV-2, NAA: NOT DETECTED

## 2020-12-20 DIAGNOSIS — Z1211 Encounter for screening for malignant neoplasm of colon: Secondary | ICD-10-CM | POA: Diagnosis not present

## 2020-12-20 DIAGNOSIS — K625 Hemorrhage of anus and rectum: Secondary | ICD-10-CM | POA: Diagnosis not present

## 2020-12-20 DIAGNOSIS — R1319 Other dysphagia: Secondary | ICD-10-CM | POA: Diagnosis not present

## 2021-02-27 DIAGNOSIS — K640 First degree hemorrhoids: Secondary | ICD-10-CM | POA: Diagnosis not present

## 2021-02-27 DIAGNOSIS — Z1211 Encounter for screening for malignant neoplasm of colon: Secondary | ICD-10-CM | POA: Diagnosis not present

## 2021-02-27 DIAGNOSIS — K5730 Diverticulosis of large intestine without perforation or abscess without bleeding: Secondary | ICD-10-CM | POA: Diagnosis not present

## 2021-02-27 LAB — HM COLONOSCOPY

## 2021-03-08 ENCOUNTER — Other Ambulatory Visit: Payer: Self-pay

## 2021-03-08 ENCOUNTER — Ambulatory Visit (INDEPENDENT_AMBULATORY_CARE_PROVIDER_SITE_OTHER): Payer: BC Managed Care – PPO | Admitting: Internal Medicine

## 2021-03-08 ENCOUNTER — Encounter: Payer: Self-pay | Admitting: Internal Medicine

## 2021-03-08 VITALS — BP 124/82 | HR 80 | Temp 98.8°F | Ht 69.0 in | Wt 210.2 lb

## 2021-03-08 DIAGNOSIS — Z23 Encounter for immunization: Secondary | ICD-10-CM

## 2021-03-08 DIAGNOSIS — I10 Essential (primary) hypertension: Secondary | ICD-10-CM

## 2021-03-08 DIAGNOSIS — E785 Hyperlipidemia, unspecified: Secondary | ICD-10-CM

## 2021-03-08 DIAGNOSIS — Z0000 Encounter for general adult medical examination without abnormal findings: Secondary | ICD-10-CM

## 2021-03-08 DIAGNOSIS — K648 Other hemorrhoids: Secondary | ICD-10-CM

## 2021-03-08 DIAGNOSIS — K5790 Diverticulosis of intestine, part unspecified, without perforation or abscess without bleeding: Secondary | ICD-10-CM

## 2021-03-08 DIAGNOSIS — Z1329 Encounter for screening for other suspected endocrine disorder: Secondary | ICD-10-CM

## 2021-03-08 DIAGNOSIS — E559 Vitamin D deficiency, unspecified: Secondary | ICD-10-CM

## 2021-03-08 DIAGNOSIS — R7303 Prediabetes: Secondary | ICD-10-CM

## 2021-03-08 MED ORDER — AMLODIPINE BESYLATE 2.5 MG PO TABS: 2.5000 mg | ORAL_TABLET | Freq: Every day | ORAL | 3 refills | Status: DC

## 2021-03-08 MED ORDER — EZETIMIBE 10 MG PO TABS: 10.0000 mg | ORAL_TABLET | Freq: Every day | ORAL | 3 refills | Status: DC

## 2021-03-08 NOTE — Patient Instructions (Addendum)
Omron blood pressure cuff for upper arm  Goal blood pressure <130/<80  Consider pfizer vaccine   Stool softner colace as needed   How to Take a [employer] A sitz bath is a warm water bath that may be used to care for your rectum, genital area, or the area between your rectum and genitals (perineum). In a sitz bath, the water only comes up to your hips and covers your buttocks. A sitz bath may be done in a bathtub or with a portable sitz baththat fits over the toilet. Your health care provider may recommend a sitz bath to help: Relieve pain and discomfort after delivering a baby. Relieve pain and itching from hemorrhoids or anal fissures. Relieve pain after certain surgeries. Relax muscles that are sore or tight. How to take a sitz bath Take 3-4 sitz baths a day, or as many as told by your health care provider. Bathtub sitz bath To take a sitz bath in a bathtub: Partially fill a bathtub with warm water. The water should be deep enough to cover your hips and buttocks when you are sitting in the tub. Follow your health care provider's instructions if you are told to put medicine in the water. Sit in the water. Open the tub drain a little, and leave it open during your bath. Turn on the warm water again, enough to replace the water that is draining out. Keep the water running throughout your bath. This helps keep the water at the right level and temperature. Soak in the water for 15-20 minutes, or as long as told by your health care provider. When you are done, be careful when you stand up. You may feel dizzy. After the sitz bath, pat yourself dry. Do not rub your skin to dry it.  Over-the-toilet sitz bath To take a sitz bath with an over-the-toilet basin: Follow the manufacturer's instructions. Fill the basin with warm water. Follow your health care provider's instructions if you were told to put medicine in the water. Sit on the seat. Make sure the water covers your buttocks and  perineum. Soak in the water for 15-20 minutes, or as long as told by your health care provider. After the sitz bath, pat yourself dry. Do not rub your skin to dry it. Clean and dry the basin between uses. Discard the basin if it cracks, or according to the manufacturer's instructions.  Contact a health care provider if: Your pain or itching gets worse. Do not continue with sitz baths if your symptoms get worse. You have new symptoms. Do not continue with sitz baths until you talk with your health care provider. Summary A sitz bath is a warm water bath in which the water only comes up to your hips and covers your buttocks. A sitz bath may help relieve pain and discomfort after delivering a baby. It also may help with pain and itching from hemorrhoids or anal fissures, or pain after certain surgeries. It can also help to relax muscles that are sore or tight. Take 3-4 sitz baths a day, or as many as told by your health care provider. Soak in the water for 15-20 minutes. Do not continue with sitz baths if your symptoms get worse. This information is not intended to replace advice given to you by your health care provider. Make sure you discuss any questions you have with your healthcare provider. Document Revised: 05/05/2020 Document Reviewed: 05/05/2020 Elsevier Patient Education  2022 Else[employer] Inc.  Hemorrhoids Hemorrhoids are swollen veins in and  around the rectum or anus. There are two types of hemorrhoids: Internal hemorrhoids. These occur in the veins that are just inside the rectum. They may poke through to the outside and become irritated and painful. External hemorrhoids. These occur in the veins that are outside the anus and can be felt as a painful swelling or hard lump near the anus. Most hemorrhoids do not cause serious problems, and they can be managed with home treatments such as diet and lifestyle changes. If home treatments do nothelp the symptoms, procedures can be done to shrink  or remove the hemorrhoids. What are the causes? This condition is caused by increased pressure in the anal area. This pressure may result from various things, including: Constipation. Straining to have a bowel movement. Diarrhea. Pregnancy. Obesity. Sitting for long periods of time. Heavy lifting or other activity that causes you to strain. Anal sex. Riding a bike for a long period of time. What are the signs or symptoms? Symptoms of this condition include: Pain. Anal itching or irritation. Rectal bleeding. Leakage of stool (feces). Anal swelling. One or more lumps around the anus. How is this diagnosed? This condition can often be diagnosed through a visual exam. Other exams or tests may also be done, such as: An exam that involves feeling the rectal area with a gloved hand (digital rectal exam). An exam of the anal canal that is done using a small tube (anoscope). A blood test, if you have lost a significant amount of blood. A test to look inside the colon using a flexible tube with a camera on the end (sigmoidoscopy or colonoscopy). How is this treated? This condition can usually be treated at home. However, various procedures may be done if dietary changes, lifestyle changes, and other home treatments do not help your symptoms. These procedures can help make the hemorrhoids smaller or remove them completely. Some of these procedures involve surgery, and others do not. Common procedures include: Rubber band ligation. Rubber bands are placed at the base of the hemorrhoids to cut off their blood supply. Sclerotherapy. Medicine is injected into the hemorrhoids to shrink them. Infrared coagulation. A type of light energy is used to get rid of the hemorrhoids. Hemorrhoidectomy surgery. The hemorrhoids are surgically removed, and the veins that supply them are tied off. Stapled hemorrhoidopexy surgery. The surgeon staples the base of the hemorrhoid to the rectal wall. Follow these  instructions at home: Eating and drinking  Eat foods that have a lot of fiber in them, such as whole grains, beans, nuts, fruits, and vegetables. Ask your health care provider about taking products that have added fiber (fiber supplements). Reduce the amount of fat in your diet. You can do this by eating low-fat dairy products, eating less red meat, and avoiding processed foods. Drink enough fluid to keep your urine pale yellow.  Managing pain and swelling  Take warm sitz baths for 20 minutes, 3-4 times a day to ease pain and discomfort. You may do this in a bathtub or using a portable sitz bath that fits over the toilet. If directed, apply ice to the affected area. Using ice packs between sitz baths may be helpful. Put ice in a plastic bag. Place a towel between your skin and the bag. Leave the ice on for 20 minutes, 2-3 times a day.  General instructions Take over-the-counter and prescription medicines only as told by your health care provider. Use medicated creams or suppositories as told. Get regular exercise. Ask your health care provider  how much and what kind of exercise is best for you. In general, you should do moderate exercise for at least 30 minutes on most days of the week (150 minutes each week). This can include activities such as walking, biking, or yoga. Go to the bathroom when you have the urge to have a bowel movement. Do not wait. Avoid straining to have bowel movements. Keep the anal area dry and clean. Use wet toilet paper or moist towelettes after a bowel movement. Do not sit on the toilet for long periods of time. This increases blood pooling and pain. Keep all follow-up visits as told by your health care provider. This is important. Contact a health care provider if you have: Increasing pain and swelling that are not controlled by treatment or medicine. Difficulty having a bowel movement, or you are unable to have a bowel movement. Pain or inflammation outside the  area of the hemorrhoids. Get help right away if you have: Uncontrolled bleeding from your rectum. Summary Hemorrhoids are swollen veins in and around the rectum or anus. Most hemorrhoids can be managed with home treatments such as diet and lifestyle changes. Taking warm sitz baths can help ease pain and discomfort. In severe cases, procedures or surgery can be done to shrink or remove the hemorrhoids. This information is not intended to replace advice given to you by your health care provider. Make sure you discuss any questions you have with your healthcare provider. Document Revised: 01/16/2019 Document Reviewed: 01/09/2018 Elsevier Patient Education  2022 Elsevier Inc.  How to Take Your Blood Pressure Blood pressure is a measurement of how strongly your blood is pressing against the walls of your arteries. Arteries are blood vessels that carry blood from your heart throughout your body. Your health care provider takes your blood pressure at each office visit. You can also take your own blood pressure athome with a blood pressure monitor. You may need to take your own blood pressure to: Confirm a diagnosis of high blood pressure (hypertension). Monitor your blood pressure over time. Make sure your blood pressure medicine is working. Supplies needed: Blood pressure monitor. Dining room chair to sit in. Table or desk. Small notebook and pencil or pen. How to prepare To get the most accurate reading, avoid the following for 30 minutes before you check your blood pressure: Drinking caffeine. Drinking alcohol. Eating. Smoking. Exercising. Five minutes before you check your blood pressure: Use the bathroom and urinate so that you have an empty bladder. Sit quietly in a dining room chair. Do not sit in a soft couch or an armchair. Do not talk. How to take your blood pressure To check your blood pressure, follow the instructions in the manual that came with your blood pressure monitor.  If you have a digital blood pressure monitor, the instructions may be as follows: Sit up straight in a chair. Place your feet on the floor. Do not cross your ankles or legs. Rest your left arm at the level of your heart on a table or desk or on the arm of a chair. Pull up your shirt sleeve. Wrap the blood pressure cuff around the upper part of your left arm, 1 inch (2.5 cm) above your elbow. It is best to wrap the cuff around bare skin. Fit the cuff snugly around your arm. You should be able to place only one finger between the cuff and your arm. Position the cord so that it rests in the bend of your elbow. Press the power  button. Sit quietly while the cuff inflates and deflates. Read the digital reading on the monitor screen and write the numbers down (record them) in a notebook. Wait 2-3 minutes, then repeat the steps, starting at step 1. What does my blood pressure reading mean? A blood pressure reading consists of a higher number over a lower number. Ideally, your blood pressure should be below 120/80. The first ("top") number is called the systolic pressure. It is a measure of the pressure in your arteries as your heart beats. The second ("bottom") number is called the diastolic pressure. It is a measure of the pressure in your arteries as theheart relaxes. Blood pressure is classified into five stages. The following are the stages for adults who do not have a short-term serious illness or a chronic condition. Systolic pressure and diastolic pressure are measured in a unit called mm Hg (millimeters of mercury). Normal Systolic pressure: below 120. Diastolic pressure: below 80. Elevated Systolic pressure: 120-129. Diastolic pressure: below 80. Hypertension stage 1 Systolic pressure: 130-139. Diastolic pressure: 80-89. Hypertension stage 2 Systolic pressure: 140 or above. Diastolic pressure: 90 or above. You can have elevated blood pressure or hypertension even if only the systolicor  only the diastolic number in your reading is higher than normal. Follow these instructions at home: Medicines Take over-the-counter and prescription medicines only as told by your health care provider. Tell your health care provider if you are having any side effects from blood pressure medicine. General instructions Check your blood pressure as often as recommended by your health care provider. Check your blood pressure at the same time every day. Take your monitor to the next appointment with your health care provider to make sure that: You are using it correctly. It provides accurate readings. Understand what your goal blood pressure numbers are. Keep all follow-up visits as told by your health care provider. This is important. General tips Your health care provider can suggest a reliable monitor that will meet your needs. There are several types of home blood pressure monitors. Choose a monitor that has an arm cuff. Do not choose a monitor that measures your blood pressure from your wrist or finger. Choose a cuff that wraps snugly around your upper arm. You should be able to fit only one finger between your arm and the cuff. You can buy a blood pressure monitor at most drugstores or online. Where to find more information American Heart Association: www.heart.org Contact a health care provider if: Your blood pressure is consistently high. Your blood pressure is suddenly low. Get help right away if: Your systolic blood pressure is higher than 180. Your diastolic blood pressure is higher than 120. Summary Blood pressure is a measurement of how strongly your blood is pressing against the walls of your arteries. A blood pressure reading consists of a higher number over a lower number. Ideally, your blood pressure should be below 120/80. Check your blood pressure at the same time every day. Avoid caffeine, alcohol, smoking, and exercise for 30 minutes prior to checking your blood  pressure. These agents can affect the accuracy of the blood pressure reading. This information is not intended to replace advice given to you by your health care provider. Make sure you discuss any questions you have with your healthcare provider. Document Revised: 06/29/2020 Document Reviewed: 08/14/2019 Elsevier Patient Education  2022 Elsevier Inc.  DASH Eating Plan DASH stands for Dietary Approaches to Stop Hypertension. The DASH eating plan is a healthy eating plan that has been shown  to: Reduce high blood pressure (hypertension). Reduce your risk for type 2 diabetes, heart disease, and stroke. Help with weight loss. What are tips for following this plan? Reading food labels Check food labels for the amount of salt (sodium) per serving. Choose foods with less than 5 percent of the Daily Value of sodium. Generally, foods with less than 300 milligrams (mg) of sodium per serving fit into this eating plan. To find whole grains, look for the word "whole" as the first word in the ingredient list. Shopping Buy products labeled as "low-sodium" or "no salt added." Buy fresh foods. Avoid canned foods and pre-made or frozen meals. Cooking Avoid adding salt when cooking. Use salt-free seasonings or herbs instead of table salt or sea salt. Check with your health care provider or pharmacist before using salt substitutes. Do not fry foods. Cook foods using healthy methods such as baking, boiling, grilling, roasting, and broiling instead. Cook with heart-healthy oils, such as olive, canola, avocado, soybean, or sunflower oil. Meal planning  Eat a balanced diet that includes: 4 or more servings of fruits and 4 or more servings of vegetables each day. Try to fill one-half of your plate with fruits and vegetables. 6-8 servings of whole grains each day. Less than 6 oz (170 g) of lean meat, poultry, or fish each day. A 3-oz (85-g) serving of meat is about the same size as a deck of cards. One egg equals 1  oz (28 g). 2-3 servings of low-fat dairy each day. One serving is 1 cup (237 mL). 1 serving of nuts, seeds, or beans 5 times each week. 2-3 servings of heart-healthy fats. Healthy fats called omega-3 fatty acids are found in foods such as walnuts, flaxseeds, fortified milks, and eggs. These fats are also found in cold-water fish, such as sardines, salmon, and mackerel. Limit how much you eat of: Canned or prepackaged foods. Food that is high in trans fat, such as some fried foods. Food that is high in saturated fat, such as fatty meat. Desserts and other sweets, sugary drinks, and other foods with added sugar. Full-fat dairy products. Do not salt foods before eating. Do not eat more than 4 egg yolks a week. Try to eat at least 2 vegetarian meals a week. Eat more home-cooked food and less restaurant, buffet, and fast food.  Lifestyle When eating at a restaurant, ask that your food be prepared with less salt or no salt, if possible. If you drink alcohol: Limit how much you use to: 0-1 drink a day for women who are not pregnant. 0-2 drinks a day for men. Be aware of how much alcohol is in your drink. In the [place] one drink equals one 12 oz bottle of beer (355 mL), one 5 oz glass of wine (148 mL), or one 1 oz glass of hard liquor (44 mL). General information Avoid eating more than 2,300 mg of salt a day. If you have hypertension, you may need to reduce your sodium intake to 1,500 mg a day. Work with your health care provider to maintain a healthy body weight or to lose weight. Ask what an ideal weight is for you. Get at least 30 minutes of exercise that causes your heart to beat faster (aerobic exercise) most days of the week. Activities may include walking, swimming, or biking. Work with your health care provider or dietitian to adjust your eating plan to your individual calorie needs. What foods should I eat? Fruits All fresh, dried, or frozen fruit. Canned fruit  in natural juice (without  addedsugar). Vegetables Fresh or frozen vegetables (raw, steamed, roasted, or grilled). Low-sodium or reduced-sodium tomato and vegetable juice. Low-sodium or reduced-sodium tomatosauce and tomato paste. Low-sodium or reduced-sodium canned vegetables. Grains Whole-grain or whole-wheat bread. Whole-grain or whole-wheat pasta. Brown rice. Orpah Cobb. Bulgur. Whole-grain and low-sodium cereals. Pita bread.Low-fat, low-sodium crackers. Whole-wheat flour tortillas. Meats and other proteins Skinless chicken or [place]. Ground chicken or [place]. Pork with fat trimmed off. Fish and seafood. Egg whites. Dried beans, peas, or lentils. Unsalted nuts, nut butters, and seeds. Unsalted canned beans. Lean cuts of beef with fat trimmed off. Low-sodium, lean precooked or cured meat, such as sausages or meatloaves. Dairy Low-fat (1%) or fat-free (skim) milk. Reduced-fat, low-fat, or fat-free cheeses. Nonfat, low-sodium ricotta or cottage cheese. Low-fat or nonfatyogurt. Low-fat, low-sodium cheese. Fats and oils Soft margarine without trans fats. Vegetable oil. Reduced-fat, low-fat, or light mayonnaise and salad dressings (reduced-sodium). Canola, safflower, olive, avocado, soybean, andsunflower oils. Avocado. Seasonings and condiments Herbs. Spices. Seasoning mixes without salt. Other foods Unsalted popcorn and pretzels. Fat-free sweets. The items listed above may not be a complete list of foods and beverages you can eat. Contact a dietitian for more information. What foods should I avoid? Fruits Canned fruit in a light or heavy syrup. Fried fruit. Fruit in cream or buttersauce. Vegetables Creamed or fried vegetables. Vegetables in a cheese sauce. Regular canned vegetables (not low-sodium or reduced-sodium). Regular canned tomato sauce and paste (not low-sodium or reduced-sodium). Regular tomato and vegetable juice(not low-sodium or reduced-sodium). Rosita Fire. Olives. Grains Baked goods made with fat, such as  croissants, muffins, or some breads. Drypasta or rice meal packs. Meats and other proteins Fatty cuts of meat. Ribs. Fried meat. Tomasa Blase. Bologna, salami, and other precooked or cured meats, such as sausages or meat loaves. Fat from the back of a pig (fatback). Bratwurst. Salted nuts and seeds. Canned beans with added salt. Canned orsmoked fish. Whole eggs or egg yolks. Chicken or [place] with skin. Dairy Whole or 2% milk, cream, and half-and-half. Whole or full-fat cream cheese. Whole-fat or sweetened yogurt. Full-fat cheese. Nondairy creamers. Whippedtoppings. Processed cheese and cheese spreads. Fats and oils Butter. Stick margarine. Lard. Shortening. Ghee. Bacon fat. Tropical oils, suchas coconut, palm kernel, or palm oil. Seasonings and condiments Onion salt, garlic salt, seasoned salt, table salt, and sea salt. Worcestershire sauce. Tartar sauce. Barbecue sauce. Teriyaki sauce. Soy sauce, including reduced-sodium. Steak sauce. Canned and packaged gravies. Fish sauce. Oyster sauce. Cocktail sauce. Store-bought horseradish. Ketchup. Mustard. Meat flavorings and tenderizers. Bouillon cubes. Hot sauces. Pre-made or packaged marinades. Pre-made or packaged taco seasonings. Relishes. Regular saladdressings. Other foods Salted popcorn and pretzels. The items listed above may not be a complete list of foods and beverages you should avoid. Contact a dietitian for more information. Where to find more information National Heart, Lung, and Blood Institute: PopSteam.is American Heart Association: www.heart.org Academy of Nutrition and Dietetics: www.eatright.org National Kidney Foundation: www.kidney.org Summary The DASH eating plan is a healthy eating plan that has been shown to reduce high blood pressure (hypertension). It may also reduce your risk for type 2 diabetes, heart disease, and stroke. When on the DASH eating plan, aim to eat more fresh fruits and vegetables, whole grains, lean proteins,  low-fat dairy, and heart-healthy fats. With the DASH eating plan, you should limit salt (sodium) intake to 2,300 mg a day. If you have hypertension, you may need to reduce your sodium intake to 1,500 mg a day. Work with your health care  provider or dietitian to adjust your eating plan to your individual calorie needs. This information is not intended to replace advice given to you by your health care provider. Make sure you discuss any questions you have with your healthcare provider. Document Revised: 07/24/2019 Document Reviewed: 07/24/2019 Elsevier Patient Education  2022 [employer].

## 2021-03-08 NOTE — Progress Notes (Signed)
Chief Complaint  Patient presents with   Annual Exam   Annual doing well but  1. Htn elevated stopped norvasc 2.5 but at GI colonoscopy BP was 157/99 and wants to take this and zetia again    Review of Systems  Constitutional:  Negative for weight loss.  HENT:  Negative for hearing loss.   Eyes:  Negative for blurred vision.  Respiratory:  Negative for shortness of breath.   Cardiovascular:  Negative for chest pain.  Gastrointestinal:  Negative for abdominal pain.  Musculoskeletal:  Negative for falls and joint pain.  Skin:  Negative for rash.  Neurological:  Negative for headaches.  Psychiatric/Behavioral:  Negative for depression.   Past Medical History:  Diagnosis Date   Blood in stool    on and off since 2014    COVID-19    Elevated blood pressure reading    Fatty liver    Heart murmur    Hyperlipidemia    Hypertension    Past Surgical History:  Procedure Laterality Date   left knee surgery Left    arthroscopy   UMBILICAL HERNIA REPAIR N/A 07/30/2018   Procedure: HERNIA REPAIR UMBILICAL ADULT;  Surgeon: Duanne Guess, MD;  Location: [facility] ORS;  Service: General;  Laterality: N/A;   Family History  Problem Relation Age of Onset   Parkinson's disease Mother    Social History   Socioeconomic History   Marital status: Married    Spouse name: Not on file   Number of children: Not on file   Years of education: Not on file   Highest education level: Not on file  Occupational History   Not on file  Tobacco Use   Smoking status: Former    Pack years: 0.00    Types: Cigarettes   Smokeless tobacco: Never   Tobacco comments:    light   Vaping Use   Vaping Use: Never used  Substance and Sexual Activity   Alcohol use: Yes    Comment: rarely   Drug use: Not Currently   Sexual activity: Yes  Other Topics Concern   Not on file  Social History Narrative   Married self employed Tree surgeon    Former smoker in 20s-30s light    No guns    Wears seat belt, safe in  relationship   [employer] ed, 2 kids    Social Determinants of Health   Financial Resource Strain: Not on file  Food Insecurity: Not on file  Transportation Needs: Not on file  Physical Activity: Not on file  Stress: Not on file  Social Connections: Not on file  Intimate Partner Violence: Not on file   No outpatient medications have been marked as taking for the 03/08/21 encounter (Office Visit) with McLean-Scocuzza, Pasty Spillers, MD.   Allergies  Allergen Reactions   Statins Other (See Comments)    Knees ache, chest felt funny   No results found for this or any previous visit (from the past 2160 hour(s)). Objective  Body mass index is 31.04 kg/m. Wt Readings from Last 3 Encounters:  03/08/21 210 lb 3.2 oz (95.3 kg)  08/22/18 209 lb 3.2 oz (94.9 kg)  08/20/18 208 lb (94.3 kg)   Temp Readings from Last 3 Encounters:  03/08/21 98.8 F (37.1 C) (Oral)  08/22/18 99 F (37.2 C) (Oral)  08/20/18 97.7 F (36.5 C) (Skin)   BP Readings from Last 3 Encounters:  03/08/21 124/82  08/22/18 (!) 150/92  08/20/18 (!) 137/92   Pulse Readings from Last 3 Encounters:  03/08/21 80  08/22/18 88  08/20/18 89    Physical Exam Vitals and nursing note reviewed.  Constitutional:      Appearance: Normal appearance. He is well-developed and well-groomed. He is obese.  HENT:     Head: Normocephalic and atraumatic.  Eyes:     Conjunctiva/sclera: Conjunctivae normal.     Pupils: Pupils are equal, round, and reactive to light.  Cardiovascular:     Rate and Rhythm: Normal rate and regular rhythm.     Heart sounds: Normal heart sounds.  Pulmonary:     Effort: Pulmonary effort is normal.     Breath sounds: Normal breath sounds.  Abdominal:     General: Abdomen is flat. Bowel sounds are normal.  Skin:    General: Skin is warm and dry.  Neurological:     General: No focal deficit present.     Mental Status: He is alert and oriented to person, place, and time. Mental status is at baseline.      Gait: Gait normal.  Psychiatric:        Attention and Perception: Attention and perception normal.        Mood and Affect: Mood and affect normal.        Speech: Speech normal.        Behavior: Behavior normal. Behavior is cooperative.        Thought Content: Thought content normal.        Cognition and Memory: Cognition and memory normal.        Judgment: Judgment normal.    Assessment  Plan  Annual physical exam - Plan:  Declines flu shot Tdap today Had 1 J&J shot declines further declines further  Disc shingrix at f/u   PSA 1.22 normal consider DRE in future  Kc colonoscopy 12/2020  Rec healthy diet and exercise   Essential hypertension - Plan: amLODipine (NORVASC) 2.5 MG tablet up to bid Hyperlipidemia, unspecified hyperlipidemia type - Plan: ezetimibe (ZETIA) 10 MG tablet, Lipid panel  Internal hemorrhoids Diverticulosis Need record colonoscopy 12/2020     Provider: Dr. French Ana McLean-Scocuzza-Internal Medicine

## 2021-03-09 ENCOUNTER — Other Ambulatory Visit: Payer: Self-pay

## 2021-03-09 ENCOUNTER — Other Ambulatory Visit (INDEPENDENT_AMBULATORY_CARE_PROVIDER_SITE_OTHER): Payer: BC Managed Care – PPO

## 2021-03-09 DIAGNOSIS — I10 Essential (primary) hypertension: Secondary | ICD-10-CM

## 2021-03-09 DIAGNOSIS — E785 Hyperlipidemia, unspecified: Secondary | ICD-10-CM | POA: Diagnosis not present

## 2021-03-09 DIAGNOSIS — E559 Vitamin D deficiency, unspecified: Secondary | ICD-10-CM

## 2021-03-09 DIAGNOSIS — Z1329 Encounter for screening for other suspected endocrine disorder: Secondary | ICD-10-CM | POA: Diagnosis not present

## 2021-03-09 DIAGNOSIS — Z0000 Encounter for general adult medical examination without abnormal findings: Secondary | ICD-10-CM | POA: Diagnosis not present

## 2021-03-09 DIAGNOSIS — R7303 Prediabetes: Secondary | ICD-10-CM | POA: Diagnosis not present

## 2021-03-09 LAB — COMPREHENSIVE METABOLIC PANEL
ALT: 20 U/L (ref 0–53)
AST: 17 U/L (ref 0–37)
Albumin: 4.3 g/dL (ref 3.5–5.2)
Alkaline Phosphatase: 64 U/L (ref 39–117)
BUN: 26 mg/dL — ABNORMAL HIGH (ref 6–23)
CO2: 27 mEq/L (ref 19–32)
Calcium: 9.6 mg/dL (ref 8.4–10.5)
Chloride: 102 mEq/L (ref 96–112)
Creatinine, Ser: 1.16 mg/dL (ref 0.40–1.50)
GFR: 67.14 mL/min (ref >60.00)
Glucose, Bld: 90 mg/dL (ref 70–99)
Potassium: 4.7 mEq/L (ref 3.5–5.1)
Sodium: 138 mEq/L (ref 135–145)
Total Bilirubin: 0.6 mg/dL (ref 0.2–1.2)
Total Protein: 7.1 g/dL (ref 6.0–8.3)

## 2021-03-09 LAB — LIPID PANEL
Cholesterol: 289 mg/dL — ABNORMAL HIGH (ref 0–200)
HDL: 48.6 mg/dL (ref >39.00)
NonHDL: 240.89
Total CHOL/HDL Ratio: 6
Triglycerides: 231 mg/dL — ABNORMAL HIGH (ref 0.0–149.0)
VLDL: 46.2 mg/dL — ABNORMAL HIGH (ref 0.0–40.0)

## 2021-03-09 LAB — CBC
HCT: 49.3 % (ref 39.0–52.0)
Hemoglobin: 16.7 g/dL (ref 13.0–17.0)
MCHC: 33.8 g/dL (ref 30.0–36.0)
MCV: 88.7 fl (ref 78.0–100.0)
Platelets: 224 10*3/uL (ref 150.0–400.0)
RBC: 5.56 Mil/uL (ref 4.22–5.81)
RDW: 13.8 % (ref 11.5–15.5)
WBC: 6.2 10*3/uL (ref 4.0–10.5)

## 2021-03-09 LAB — VITAMIN D 25 HYDROXY (VIT D DEFICIENCY, FRACTURES): VITD: 35.11 ng/mL (ref 30.00–100.00)

## 2021-03-09 LAB — TSH: TSH: 0.99 u[IU]/mL (ref 0.35–5.50)

## 2021-03-09 LAB — PSA: PSA: 1.42 ng/mL (ref 0.10–4.00)

## 2021-03-09 LAB — LDL CHOLESTEROL, DIRECT: Direct LDL: 204 mg/dL

## 2021-03-09 LAB — HEMOGLOBIN A1C: Hgb A1c MFr Bld: 6.4 % (ref 4.6–6.5)

## 2021-03-10 LAB — URINALYSIS, ROUTINE W REFLEX MICROSCOPIC
Bilirubin Urine: NEGATIVE
Glucose, UA: NEGATIVE
Hgb urine dipstick: NEGATIVE
Ketones, ur: NEGATIVE
Leukocytes,Ua: NEGATIVE
Nitrite: NEGATIVE
Protein, ur: NEGATIVE
Specific Gravity, Urine: 1.022 (ref 1.001–1.035)
pH: 6 (ref 5.0–8.0)

## 2021-03-14 ENCOUNTER — Encounter: Payer: Self-pay | Admitting: Internal Medicine

## 2021-06-19 ENCOUNTER — Encounter: Payer: Self-pay | Admitting: General Surgery

## 2021-09-08 ENCOUNTER — Encounter: Payer: Self-pay | Admitting: Internal Medicine

## 2021-09-08 ENCOUNTER — Other Ambulatory Visit: Payer: Self-pay

## 2021-09-08 ENCOUNTER — Ambulatory Visit (INDEPENDENT_AMBULATORY_CARE_PROVIDER_SITE_OTHER): Payer: BC Managed Care – PPO | Admitting: Internal Medicine

## 2021-09-08 VITALS — BP 126/80 | HR 70 | Temp 96.6°F | Ht 69.0 in | Wt 216.6 lb

## 2021-09-08 DIAGNOSIS — R7303 Prediabetes: Secondary | ICD-10-CM | POA: Diagnosis not present

## 2021-09-08 DIAGNOSIS — L309 Dermatitis, unspecified: Secondary | ICD-10-CM

## 2021-09-08 DIAGNOSIS — I10 Essential (primary) hypertension: Secondary | ICD-10-CM | POA: Diagnosis not present

## 2021-09-08 DIAGNOSIS — K921 Melena: Secondary | ICD-10-CM | POA: Diagnosis not present

## 2021-09-08 DIAGNOSIS — E785 Hyperlipidemia, unspecified: Secondary | ICD-10-CM | POA: Diagnosis not present

## 2021-09-08 DIAGNOSIS — K648 Other hemorrhoids: Secondary | ICD-10-CM

## 2021-09-08 LAB — CBC WITH DIFFERENTIAL/PLATELET
Basophils Absolute: 0 10*3/uL (ref 0.0–0.1)
Basophils Relative: 0.6 % (ref 0.0–3.0)
Eosinophils Absolute: 0.3 10*3/uL (ref 0.0–0.7)
Eosinophils Relative: 7 % — ABNORMAL HIGH (ref 0.0–5.0)
HCT: 50.1 % (ref 39.0–52.0)
Hemoglobin: 16.7 g/dL (ref 13.0–17.0)
Lymphocytes Relative: 38.8 % (ref 12.0–46.0)
Lymphs Abs: 1.7 10*3/uL (ref 0.7–4.0)
MCHC: 33.4 g/dL (ref 30.0–36.0)
MCV: 89.8 fl (ref 78.0–100.0)
Monocytes Absolute: 0.6 10*3/uL (ref 0.1–1.0)
Monocytes Relative: 14.5 % — ABNORMAL HIGH (ref 3.0–12.0)
Neutro Abs: 1.7 10*3/uL (ref 1.4–7.7)
Neutrophils Relative %: 39.1 % — ABNORMAL LOW (ref 43.0–77.0)
Platelets: 209 10*3/uL (ref 150.0–400.0)
RBC: 5.58 Mil/uL (ref 4.22–5.81)
RDW: 13.3 % (ref 11.5–15.5)
WBC: 4.3 10*3/uL (ref 4.0–10.5)

## 2021-09-08 LAB — LIPID PANEL
Cholesterol: 223 mg/dL — ABNORMAL HIGH (ref 0–200)
HDL: 41.4 mg/dL (ref >39.00)
LDL Cholesterol: 166 mg/dL — ABNORMAL HIGH (ref 0–99)
NonHDL: 181.98
Total CHOL/HDL Ratio: 5
Triglycerides: 80 mg/dL (ref 0.0–149.0)
VLDL: 16 mg/dL (ref 0.0–40.0)

## 2021-09-08 LAB — COMPREHENSIVE METABOLIC PANEL
ALT: 35 U/L (ref 0–53)
AST: 26 U/L (ref 0–37)
Albumin: 4.5 g/dL (ref 3.5–5.2)
Alkaline Phosphatase: 67 U/L (ref 39–117)
BUN: 21 mg/dL (ref 6–23)
CO2: 28 mEq/L (ref 19–32)
Calcium: 9.5 mg/dL (ref 8.4–10.5)
Chloride: 101 mEq/L (ref 96–112)
Creatinine, Ser: 1.14 mg/dL (ref 0.40–1.50)
GFR: 68.31 mL/min (ref >60.00)
Glucose, Bld: 95 mg/dL (ref 70–99)
Potassium: 4.3 mEq/L (ref 3.5–5.1)
Sodium: 136 mEq/L (ref 135–145)
Total Bilirubin: 0.6 mg/dL (ref 0.2–1.2)
Total Protein: 7.1 g/dL (ref 6.0–8.3)

## 2021-09-08 LAB — HEMOGLOBIN A1C: Hgb A1c MFr Bld: 6.5 % (ref 4.6–6.5)

## 2021-09-08 MED ORDER — CLOBETASOL PROPIONATE 0.05 % EX CREA: 1.0000 "application " | TOPICAL_CREAM | Freq: Two times a day (BID) | CUTANEOUS | 2 refills | Status: DC

## 2021-09-08 NOTE — Progress Notes (Signed)
Chief Complaint  Patient presents with   Follow-up   F/u  1. Htn controlled on norvasc 2.5 mg qd and hld on zetia 10 and taking  2.c/o bleeding hemorrhoids ih may want repaired as bleeding happening more freq and he is not constipated or straining 3. Rash to right forearm left upper chest x weeks itching and red and ulcerated and gets thick skin hold derm for now  4. Hld rec ct cardiac will disc with wife   Review of Systems  Constitutional:  Negative for weight loss.  HENT:  Negative for hearing loss.   Eyes:  Negative for blurred vision.  Respiratory:  Negative for shortness of breath.   Cardiovascular:  Negative for chest pain.  Gastrointestinal:  Negative for abdominal pain and blood in stool.  Musculoskeletal:  Negative for back pain.  Skin:  Negative for rash.  Neurological:  Negative for headaches.  Psychiatric/Behavioral:  Negative for depression.   Past Medical History:  Diagnosis Date   Blood in stool    on and off since 2014    COVID-19    Elevated blood pressure reading    Fatty liver    Heart murmur    Hyperlipidemia    Hypertension    Past Surgical History:  Procedure Laterality Date   left knee surgery Left    arthroscopy   UMBILICAL HERNIA REPAIR N/A 07/30/2018   Procedure: HERNIA REPAIR UMBILICAL ADULT;  Surgeon: Fredirick Maudlin, MD;  Location: [facility] OR[facility];  Service: General;  Laterality: N/A;   Family History  Problem Relation Age of Onset   Parkinson's disease Mother    Social History   Socioeconomic History   Marital status: Married    Spouse name: Not on file   Number of children: Not on file   Years of education: Not on file   Highest education level: Not on file  Occupational History   Not on file  Tobacco Use   Smoking status: Former    Types: Cigarettes   Smokeless tobacco: Never   Tobacco comments:    light   Vaping Use   Vaping Use: Never used  Substance and Sexual Activity   Alcohol use: Yes    Comment: rarely   Drug use: Not  Currently   Sexual activity: Yes  Other Topics Concern   Not on file  Social History Narrative   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 In am if BP >130/>80   clobetasol cream (TEMOVATE) [id number] % Apply 1 application topically 2 (two) times daily. Prn right arm and left upper chest   ezetimibe (ZETIA) 10 MG tablet Take 1 tablet (10 mg total) by mouth daily.   Allergies  Allergen Reactions   Statins Other (See Comments)    Knees ache, chest felt funny   No results found for this or any previous visit (from the past 2160 hour(s)). Objective  Body mass index is 31.99 kg/m. Wt  Readings from Last 3 Encounters:  09/08/21 216 lb 9.6 oz (98.2 kg)  03/08/21 210 lb 3.2 oz (95.3 kg)  08/22/18 209 lb 3.2 oz (94.9 kg)   Temp Readings from Last 3 Encounters:  09/08/21 (!) 96.6 F (35.9 C) (Temporal)  03/08/21 98.8 F (37.1 C) (Oral)  08/22/18 99 F (37.2 C) (Oral)   BP Readings from Last 3 Encounters:  09/08/21 126/80  03/08/21 124/82  08/22/18 (!) 150/92   Pulse Readings from Last 3 Encounters:  09/08/21 70  03/08/21 80  08/22/18 88    Physical Exam Vitals and nursing note reviewed.  Constitutional:      Appearance: Normal appearance. He is well-developed and well-groomed. He is obese.  HENT:     Head: Normocephalic and atraumatic.  Eyes:     Conjunctiva/sclera: Conjunctivae normal.     Pupils: Pupils are equal, round, and reactive to light.  Cardiovascular:     Rate and Rhythm: Normal rate and  regular rhythm.     Heart sounds: Normal heart sounds.  Pulmonary:     Effort: Pulmonary effort is normal. No respiratory distress.     Breath sounds: Normal breath sounds.  Abdominal:     Tenderness: There is no abdominal tenderness.  Musculoskeletal:     Lumbar back: Tenderness present. Negative right straight leg raise test and negative left straight leg raise test.  Skin:    General: Skin is warm and moist.       Neurological:     General: No focal deficit present.     Mental Status: He is alert and oriented to person, place, and time. Mental status is at baseline.     Sensory: Sensation is intact.     Motor: Motor function is intact.     Coordination: Coordination is intact.     Gait: Gait is intact. Gait normal.  Psychiatric:        Attention and Perception: Attention and perception normal.        Mood and Affect: Mood and affect normal.        Speech: Speech normal.        Behavior: Behavior normal. Behavior is cooperative.        Thought Content: Thought content normal.        Cognition and Memory: Cognition and memory normal.        Judgment: Judgment normal.    Assessment  Plan  Hyperlipidemia, Ct cardiac consider Zetia 10  Primary hypertension controlled norvasc 2.5 mg qd- Plan: Comprehensive metabolic panel, Lipid panel, CBC w/Diff  Blood in stool Consider Dr. Audie Clear [facility] repair unc pt will call back   Dermatitis - Plan: clobetasol cream (TEMOVATE) 0.05 % Consider derm  Prediabetes - Plan: Hemoglobin   HM Declines flu shot Tdap 03/08/21 Had 1 J&J shot declines further declines further  Disc shingrix at f/u   PSA 1.42 normal consider DRE in future  Kc colonoscopy 02/27/21 IH diverticulosis Rec healthy diet and exercise    Essential hypertension controlled - Plan: amLODipine (NORVASC) 2.5 MG tablet up to bid Hyperlipidemia, unspecified hyperlipidemia type - Plan: ezetimibe (ZETIA) 10 MG tablet, Lipid panel    Provider: Dr. Olivia Mackie McLean-Scocuzza-Internal  Medicine

## 2021-09-08 NOTE — Patient Instructions (Addendum)
Think about this $100 test to check heart arteries and plaque build up   Let me know if you want me to order this test   Consider dermatology let me know if desired referral   Hilma Favorsimothy S. Sadiq, MD, FACS-hemorrhoid surgeon Address: [address], [place], [place] [address] Hours:  Open ? Closes 4:30PM Phone: [phone] Hemorrhoids Hemorrhoids are swollen veins in and around the rectum or anus. There are two types of hemorrhoids: Internal hemorrhoids. These occur in the veins that are just inside the rectum. They may poke through to the outside and become irritated and painful. External hemorrhoids. These occur in the veins that are outside the anus and can be felt as a painful swelling or hard lump near the anus. Most hemorrhoids do not cause serious problems, and they can be managed with home treatments such as diet and lifestyle changes. If home treatments do not help the symptoms, procedures can be done to shrink or remove the hemorrhoids. What are the causes? This condition is caused by increased pressure in the anal area. This pressure may result from various things, including: Constipation. Straining to have a bowel movement. Diarrhea. Pregnancy. Obesity. Sitting for long periods of time. Heavy lifting or other activity that causes you to strain. Anal sex. Riding a bike for a long period of time. What are the signs or symptoms? Symptoms of this condition include: Pain. Anal itching or irritation. Rectal bleeding. Leakage of stool (feces). Anal swelling. One or more lumps around the anus. How is this diagnosed? This condition can often be diagnosed through a visual exam. Other exams or tests may also be done, such as: An exam that involves feeling the rectal area with a gloved hand (digital rectal exam). An exam of the anal canal that is done using a small tube (anoscope). A blood test, if you have lost a significant amount of blood. A test to look inside the  colon using a flexible tube with a camera on the end (sigmoidoscopy or colonoscopy). How is this treated? This condition can usually be treated at home. However, various procedures may be done if dietary changes, lifestyle changes, and other home treatments do not help your symptoms. These procedures can help make the hemorrhoids smaller or remove them completely. Some of these procedures involve surgery, and others do not. Common procedures include: Rubber band ligation. Rubber bands are placed at the base of the hemorrhoids to cut off their blood supply. Sclerotherapy. Medicine is injected into the hemorrhoids to shrink them. Infrared coagulation. A type of light energy is used to get rid of the hemorrhoids. Hemorrhoidectomy surgery. The hemorrhoids are surgically removed, and the veins that supply them are tied off. Stapled hemorrhoidopexy surgery. The surgeon staples the base of the hemorrhoid to the rectal wall. Follow these instructions at home: Eating and drinking  Eat foods that have a lot of fiber in them, such as whole grains, beans, nuts, fruits, and vegetables. Ask your health care provider about taking products that have added fiber (fiber supplements). Reduce the amount of fat in your diet. You can do this by eating low-fat dairy products, eating less red meat, and avoiding processed foods. Drink enough fluid to keep your urine pale yellow. Managing pain and swelling  Take warm sitz baths for 20 minutes, 3-4 times a day to ease pain and discomfort. You may do this in a bathtub or using a portable sitz bath that fits over the toilet. If directed, apply ice to  the affected area. Using ice packs between sitz baths may be helpful. Put ice in a plastic bag. Place a towel between your skin and the bag. Leave the ice on for 20 minutes, 2-3 times a day. General instructions Take over-the-counter and prescription medicines only as told by your health care provider. Use medicated creams  or suppositories as told. Get regular exercise. Ask your health care provider how much and what kind of exercise is best for you. In general, you should do moderate exercise for at least 30 minutes on most days of the week (150 minutes each week). This can include activities such as walking, biking, or yoga. Go to the bathroom when you have the urge to have a bowel movement. Do not wait. Avoid straining to have bowel movements. Keep the anal area dry and clean. Use wet toilet paper or moist towelettes after a bowel movement. Do not sit on the toilet for long periods of time. This increases blood pooling and pain. Keep all follow-up visits as told by your health care provider. This is important. Contact a health care provider if you have: Increasing pain and swelling that are not controlled by treatment or medicine. Difficulty having a bowel movement, or you are unable to have a bowel movement. Pain or inflammation outside the area of the hemorrhoids. Get help right away if you have: Uncontrolled bleeding from your rectum. Summary Hemorrhoids are swollen veins in and around the rectum or anus. Most hemorrhoids can be managed with home treatments such as diet and lifestyle changes. Taking warm sitz baths can help ease pain and discomfort. In severe cases, procedures or surgery can be done to shrink or remove the hemorrhoids. This information is not intended to replace advice given to you by your health care provider. Make sure you discuss any questions you have with your health care provider. Document Revised: 03/01/2021 Document Reviewed: 03/01/2021 Elsevier Patient Education  2022 Elsevier Inc.  Coronary Calcium Scan A coronary calcium scan is an imaging test used to look for deposits of plaque in the inner lining of the blood vessels of the heart (coronary arteries). Plaque is made up of calcium, protein, and fatty substances. These deposits of plaque can partly clog and narrow the coronary  arteries without producing any symptoms or warning signs. This puts a person at risk for a heart attack. This test is recommended for people who are at moderate risk for heart disease. The test can find plaque deposits before symptoms develop. Tell a health care provider about: Any allergies you have. All medicines you are taking, including vitamins, herbs, eye drops, creams, and over-the-counter medicines. Any problems you or family members have had with anesthetic medicines. Any blood disorders you have. Any surgeries you have had. Any medical conditions you have. Whether you are pregnant or may be pregnant. What are the risks? Generally, this is a safe procedure. However, problems may occur, including: Harm to a pregnant woman and her unborn baby. This test involves the use of radiation. Radiation exposure can be dangerous to a pregnant woman and her unborn baby. If you are pregnant or think you may be pregnant, you should not have this procedure done. Slight increase in the risk of cancer. This is because of the radiation involved in the test. What happens before the procedure? Ask your health care provider for any specific instructions on how to prepare for this procedure. You may be asked to avoid products that contain caffeine, tobacco, or nicotine for 4 hours  before the procedure. What happens during the procedure?  You will undress and remove any jewelry from your neck or chest. You will put on a hospital gown. Sticky electrodes will be placed on your chest. The electrodes will be connected to an electrocardiogram (ECG) machine to record a tracing of the electrical activity of your heart. You will lie down on a curved bed that is attached to the CT scanner. You may be given medicine to slow down your heart rate so that clear pictures can be created. You will be moved into the CT scanner, and the CT scanner will take pictures of your heart. During this time, you will be asked to lie  still and hold your breath for 2-3 seconds at a time while each picture of your heart is being taken. The procedure may vary among health care providers and hospitals. What happens after the procedure? You can get dressed. You can return to your normal activities. It is up to you to get the results of your procedure. Ask your health care provider, or the department that is doing the procedure, when your results will be ready. Summary A coronary calcium scan is an imaging test used to look for deposits of plaque in the inner lining of the blood vessels of the heart (coronary arteries). Plaque is made up of calcium, protein, and fatty substances. Generally, this is a safe procedure. Tell your health care provider if you are pregnant or may be pregnant. Ask your health care provider for any specific instructions on how to prepare for this procedure. A CT scanner will take pictures of your heart. You can return to your normal activities after the scan is done. This information is not intended to replace advice given to you by your health care provider. Make sure you discuss any questions you have with your health care provider. Document Revised: 03/05/2019 Document Reviewed: 03/10/2019 Elsevier Patient Education  2022 Elsevier Inc   Hives Hives (urticaria) are itchy, red, swollen areas on the skin. Hives can appear on any part of the body. Hives often fade within 24 hours (acute hives). Sometimes, new hives appear after old ones fade and the cycle can continue for several days or weeks (chronic hives). Hives do not spread from person to person (are not contagious). Hives come from the body's reaction to something a person is allergic to (allergen), something that causes irritation, or various other triggers. When a person is exposed to a trigger, his or her body releases a chemical (histamine) that causes redness, itching, and swelling. Hives can appear right after exposure to a trigger or hours  later. What are the causes? This condition may be caused by: Allergies to foods or ingredients. Insect bites or stings. Exposure to pollen or pets. Spending time in sunlight, heat, or cold (exposure). Exercise. Stress. You can also get hives from other medical conditions and treatments, such as: Viruses, including the common cold. Bacterial infections, such as urinary tract infections and strep throat. Certain medicines. Contact with latex or chemicals. Allergy shots. Blood transfusions. Sometimes, the cause of this condition is not known (idiopathic hives). What increases the risk? You are more likely to develop this condition if you: Are a woman. Have food allergies, especially to citrus fruits, milk, eggs, peanuts, tree nuts, or shellfish. Are allergic to: Medicines. Latex. Insects. Animals. Pollen. What are the signs or symptoms? Common symptoms of this condition include raised, itchy, red or white bumps or patches on your skin. These areas may: Become  large and swollen (welts). Change in shape and location, quickly and repeatedly. Be separate hives or connect over a large area of skin. Sting or become painful. Turn white when pressed in the center (blanch). In severe cases, your hands, feet, and face may also become swollen. This may occur if hives develop deeper in your skin. How is this diagnosed? This condition may be diagnosed by your symptoms, medical history, and physical exam. Your skin, urine, or blood may be tested to find out what is causing your hives and to rule out other health issues. Your health care provider may also remove a small sample of skin from the affected area and examine it under a microscope (biopsy). How is this treated? Treatment for this condition depends on the cause and severity of your symptoms. Your health care provider may recommend using cool, wet cloths (cool compresses) or taking cool showers to relieve itching. Treatment may  include: Medicines that help: Relieve itching (antihistamines). Reduce swelling (corticosteroids). Treat infection (antibiotics). An injectable medicine (omalizumab). Your health care provider may prescribe this if you have chronic idiopathic hives and you continue to have symptoms even after treatment with antihistamines. Severe cases may require an emergency injection of adrenaline (epinephrine) to prevent a life-threatening allergic reaction (anaphylaxis). Follow these instructions at home: Medicines Take and apply over-the-counter and prescription medicines only as told by your health care provider. If you were prescribed an antibiotic medicine, take it as told by your health care provider. Do not stop using the antibiotic even if you start to feel better. Skin care Apply cool compresses to the affected areas. Do not scratch or rub your skin. General instructions Do not take hot showers or baths. This can make itching worse. Do not wear tight-fitting clothing. Use sunscreen and wear protective clothing when you are outside. Avoid any substances that cause your hives. Keep a journal to help track what causes your hives. Write down: What medicines you take. What you eat and drink. What products you use on your skin. Keep all follow-up visits as told by your health care provider. This is important. Contact a health care provider if: Your symptoms are not controlled with medicine. Your joints are painful or swollen. Get help right away if: You have a fever. You have pain in your abdomen. Your tongue or lips are swollen. Your eyelids are swollen. Your chest or throat feels tight. You have trouble breathing or swallowing. These symptoms may represent a serious problem that is an emergency. Do not wait to see if the symptoms will go away. Get medical help right away. Call your local emergency services (911 in the [place] Do not drive yourself to the hospital. Summary Hives (urticaria)  are itchy, red, swollen areas on your skin. Hives come from the body's reaction to something a person is allergic to (allergen), something that causes irritation, or various other triggers. Treatment for this condition depends on the cause and severity of your symptoms. Avoid any substances that cause your hives. Keep a journal to help track what causes your hives. Take and apply over-the-counter and prescription medicines only as told by your health care provider. Get help right away if your chest or throat feels tight or if you have trouble breathing or swallowing. This information is not intended to replace advice given to you by your health care provider. Make sure you discuss any questions you have with your health care provider. Document Revised: 10/09/2020 Document Reviewed: 10/09/2020 Elsevier Patient Education  2022 [employer]. .

## 2022-01-23 DIAGNOSIS — L309 Dermatitis, unspecified: Secondary | ICD-10-CM | POA: Diagnosis not present

## 2022-03-08 ENCOUNTER — Ambulatory Visit (INDEPENDENT_AMBULATORY_CARE_PROVIDER_SITE_OTHER): Payer: BC Managed Care – PPO | Admitting: Internal Medicine

## 2022-03-08 ENCOUNTER — Encounter: Payer: Self-pay | Admitting: Internal Medicine

## 2022-03-08 VITALS — BP 160/100 | HR 66 | Temp 98.5°F | Resp 14 | Ht 69.0 in | Wt 209.8 lb

## 2022-03-08 DIAGNOSIS — Z1329 Encounter for screening for other suspected endocrine disorder: Secondary | ICD-10-CM | POA: Diagnosis not present

## 2022-03-08 DIAGNOSIS — Z1389 Encounter for screening for other disorder: Secondary | ICD-10-CM

## 2022-03-08 DIAGNOSIS — E119 Type 2 diabetes mellitus without complications: Secondary | ICD-10-CM | POA: Diagnosis not present

## 2022-03-08 DIAGNOSIS — Z125 Encounter for screening for malignant neoplasm of prostate: Secondary | ICD-10-CM

## 2022-03-08 DIAGNOSIS — R7303 Prediabetes: Secondary | ICD-10-CM | POA: Diagnosis not present

## 2022-03-08 DIAGNOSIS — E559 Vitamin D deficiency, unspecified: Secondary | ICD-10-CM | POA: Diagnosis not present

## 2022-03-08 DIAGNOSIS — E785 Hyperlipidemia, unspecified: Secondary | ICD-10-CM

## 2022-03-08 DIAGNOSIS — I10 Essential (primary) hypertension: Secondary | ICD-10-CM

## 2022-03-08 DIAGNOSIS — Z0001 Encounter for general adult medical examination with abnormal findings: Secondary | ICD-10-CM

## 2022-03-08 DIAGNOSIS — K648 Other hemorrhoids: Secondary | ICD-10-CM

## 2022-03-08 DIAGNOSIS — K921 Melena: Secondary | ICD-10-CM

## 2022-03-08 LAB — COMPREHENSIVE METABOLIC PANEL
ALT: 25 U/L (ref 0–53)
AST: 19 U/L (ref 0–37)
Albumin: 4.4 g/dL (ref 3.5–5.2)
Alkaline Phosphatase: 64 U/L (ref 39–117)
BUN: 22 mg/dL (ref 6–23)
CO2: 28 mEq/L (ref 19–32)
Calcium: 9.3 mg/dL (ref 8.4–10.5)
Chloride: 101 mEq/L (ref 96–112)
Creatinine, Ser: 0.92 mg/dL (ref 0.40–1.50)
GFR: 88.05 mL/min (ref >60.00)
Glucose, Bld: 93 mg/dL (ref 70–99)
Potassium: 4.3 mEq/L (ref 3.5–5.1)
Sodium: 136 mEq/L (ref 135–145)
Total Bilirubin: 0.5 mg/dL (ref 0.2–1.2)
Total Protein: 6.9 g/dL (ref 6.0–8.3)

## 2022-03-08 LAB — LIPID PANEL
Cholesterol: 221 mg/dL — ABNORMAL HIGH (ref 0–200)
HDL: 48.6 mg/dL (ref >39.00)
LDL Cholesterol: 149 mg/dL — ABNORMAL HIGH (ref 0–99)
NonHDL: 172.33
Total CHOL/HDL Ratio: 5
Triglycerides: 119 mg/dL (ref 0.0–149.0)
VLDL: 23.8 mg/dL (ref 0.0–40.0)

## 2022-03-08 LAB — CBC WITH DIFFERENTIAL/PLATELET
Basophils Absolute: 0 10*3/uL (ref 0.0–0.1)
Basophils Relative: 0.7 % (ref 0.0–3.0)
Eosinophils Absolute: 0.4 10*3/uL (ref 0.0–0.7)
Eosinophils Relative: 6.9 % — ABNORMAL HIGH (ref 0.0–5.0)
HCT: 45.2 % (ref 39.0–52.0)
Hemoglobin: 15 g/dL (ref 13.0–17.0)
Lymphocytes Relative: 20.4 % (ref 12.0–46.0)
Lymphs Abs: 1.3 10*3/uL (ref 0.7–4.0)
MCHC: 33.1 g/dL (ref 30.0–36.0)
MCV: 91.4 fl (ref 78.0–100.0)
Monocytes Absolute: 0.6 10*3/uL (ref 0.1–1.0)
Monocytes Relative: 8.6 % (ref 3.0–12.0)
Neutro Abs: 4.2 10*3/uL (ref 1.4–7.7)
Neutrophils Relative %: 63.4 % (ref 43.0–77.0)
Platelets: 241 10*3/uL (ref 150.0–400.0)
RBC: 4.95 Mil/uL (ref 4.22–5.81)
RDW: 13.6 % (ref 11.5–15.5)
WBC: 6.6 10*3/uL (ref 4.0–10.5)

## 2022-03-08 LAB — PSA: PSA: 1.26 ng/mL (ref 0.10–4.00)

## 2022-03-08 LAB — HEMOGLOBIN A1C: Hgb A1c MFr Bld: 6.1 % (ref 4.6–6.5)

## 2022-03-08 LAB — VITAMIN D 25 HYDROXY (VIT D DEFICIENCY, FRACTURES): VITD: 34.51 ng/mL (ref 30.00–100.00)

## 2022-03-08 LAB — TSH: TSH: 0.85 u[IU]/mL (ref 0.35–5.50)

## 2022-03-08 MED ORDER — AMLODIPINE BESYLATE 2.5 MG PO TABS: 2.5000 mg | ORAL_TABLET | Freq: Two times a day (BID) | ORAL | 3 refills | Status: DC

## 2022-03-08 MED ORDER — EZETIMIBE 10 MG PO TABS: 10.0000 mg | ORAL_TABLET | Freq: Every day | ORAL | 3 refills | Status: DC

## 2022-03-08 NOTE — Patient Instructions (Addendum)
Marc Favors, MD, FACS tell them want to come 11 or 08/2022  5.0 9 Google reviews Surgeon in [place], [place] Get online care: [email or web address] Address: [address], [place], [place] [address] Hours:  Open ? Closes 4:30?PM Phone: [phone]  Blood pressure goal <130/<80 goal if higher take norvasc 2.5 2x per day   Dr. Clent Ridges

## 2022-03-08 NOTE — Progress Notes (Signed)
Chief Complaint  Patient presents with   Follow-up    6 mon, denies any concerns or pain.   Annual doing well but htn elevated took norvasc 2.5 mg qd not checking bp eating healthy and biking just restarted  Bleeding IH wants repaired in 11 or 08/2022   Review of Systems  Constitutional:  Negative for weight loss.  HENT:  Negative for hearing loss.   Eyes:  Negative for blurred vision.  Respiratory:  Negative for shortness of breath.   Cardiovascular:  Negative for chest pain.  Gastrointestinal:  Negative for abdominal pain and blood in stool.  Musculoskeletal:  Negative for back pain.  Skin:  Negative for rash.  Neurological:  Negative for headaches.  Psychiatric/Behavioral:  Negative for depression.    Past Medical History:  Diagnosis Date   Blood in stool    on and off since 2014    COVID-19    2021 and 11 or 08/2021   Elevated blood pressure reading    Fatty liver    Heart murmur    Hyperlipidemia    Hypertension    Past Surgical History:  Procedure Laterality Date   left knee surgery Left    arthroscopy   UMBILICAL HERNIA REPAIR N/A 07/30/2018   Procedure: HERNIA REPAIR UMBILICAL ADULT;  Surgeon: Duanne Guess, MD;  Location: [facility] ORS;  Service: General;  Laterality: N/A;   Family History  Problem Relation Age of Onset   Parkinson's disease Mother    Drug abuse Son        died overdose   Social History   Socioeconomic History   Marital status: Married    Spouse name: Not on file   Number of children: Not on file   Years of education: Not on file   Highest education level: Not on file  Occupational History   Not on file  Tobacco Use   Smoking status: Former    Types: Cigarettes   Smokeless tobacco: Never   Tobacco comments:    light   Vaping Use   Vaping Use: Never used  Substance and Sexual Activity   Alcohol use: Yes    Comment: rarely   Drug use: Not Currently   Sexual activity: Yes  Other Topics Concern   Not on file  Social History  Narrative   Married self employed Tree surgeon    Former smoker in 20s-30s light    No guns    Wears seat belt, safe in relationship   [employer] ed, 2 kids    Social Determinants of Health   Financial Resource Strain: Not on file  Food Insecurity: Not on file  Transportation Needs: Not on file  Physical Activity: Not on file  Stress: Not on file  Social Connections: Not on file  Intimate Partner Violence: Not on file   Current Meds  Medication Sig   [DISCONTINUED] amLODipine (NORVASC) 2.5 MG tablet Take 1 tablet (2.5 mg total) by mouth daily. In am if BP >130/>80   [DISCONTINUED] ezetimibe (ZETIA) 10 MG tablet Take 1 tablet (10 mg total) by mouth daily.   Allergies  Allergen Reactions   Statins Other (See Comments)    Knees ache, chest felt funny   No results found for this or any previous visit (from the past 2160 hour(s)). Objective  Body mass index is 30.98 kg/m. Wt Readings from Last 3 Encounters:  03/08/22 209 lb 12.8 oz (95.2 kg)  09/08/21 216 lb 9.6 oz (98.2 kg)  03/08/21 210 lb 3.2 oz (95.3 kg)  Temp Readings from Last 3 Encounters:  03/08/22 98.5 F (36.9 C) (Oral)  09/08/21 (!) 96.6 F (35.9 C) (Temporal)  03/08/21 98.8 F (37.1 C) (Oral)   BP Readings from Last 3 Encounters:  03/08/22 (!) 160/100  09/08/21 126/80  03/08/21 124/82   Pulse Readings from Last 3 Encounters:  03/08/22 66  09/08/21 70  03/08/21 80    Physical Exam Vitals and nursing note reviewed.  Constitutional:      Appearance: Normal appearance. He is well-developed and well-groomed.  HENT:     Head: Normocephalic and atraumatic.  Eyes:     Conjunctiva/sclera: Conjunctivae normal.     Pupils: Pupils are equal, round, and reactive to light.  Cardiovascular:     Rate and Rhythm: Normal rate and regular rhythm.     Heart sounds: Normal heart sounds.  Pulmonary:     Effort: Pulmonary effort is normal. No respiratory distress.     Breath sounds: Normal breath sounds.  Abdominal:      Tenderness: There is no abdominal tenderness.  Skin:    General: Skin is warm and moist.  Neurological:     General: No focal deficit present.     Mental Status: He is alert and oriented to person, place, and time. Mental status is at baseline.     Sensory: Sensation is intact.     Motor: Motor function is intact.     Coordination: Coordination is intact.     Gait: Gait is intact. Gait normal.  Psychiatric:        Attention and Perception: Attention and perception normal.        Mood and Affect: Mood and affect normal.        Speech: Speech normal.        Behavior: Behavior normal. Behavior is cooperative.        Thought Content: Thought content normal.        Cognition and Memory: Cognition and memory normal.        Judgment: Judgment normal.     Assessment  Plan  Abnormal physical evaluation  Hypertension, unspecified type On norvasc 2.5 mg qd to bid   Hyperlipidemia, unspecified hyperlipidemia type - Plan: ezetimibe (ZETIA) 10 MG tablet  Blood in stool - Plan: Ambulatory referral to General Surgery Dr. Elenore Rota Internal hemorrhoids - Plan: Ambulatory referral to General Surgery  Type 2 diabetes mellitus without complication, without long-term current use of insulin (HCC) - Plan: Hemoglobin A1c  Vitamin D deficiency - Plan: Vitamin D (25 hydroxy)  HM Declines flu shot Tdap 03/08/21 Had 1 J&J shot declines further declines further  Disc shingrix declines for now   PSA 1.42 normal consider DRE in future  [facility] colonoscopy 02/27/21 [facility] diverticulosis referred unc Dr. Elenore Rota [facility] repair IH Rec healthy diet and exercise   Provider: Dr. French Ana McLean-Scocuzza-Internal Medicine

## 2022-03-09 LAB — URINALYSIS, ROUTINE W REFLEX MICROSCOPIC
Bilirubin Urine: NEGATIVE
Glucose, UA: NEGATIVE
Hgb urine dipstick: NEGATIVE
Ketones, ur: NEGATIVE
Leukocytes,Ua: NEGATIVE
Nitrite: NEGATIVE
Protein, ur: NEGATIVE
Specific Gravity, Urine: 1.02 (ref 1.001–1.035)
pH: 7.5 (ref 5.0–8.0)

## 2022-05-09 DIAGNOSIS — Z6831 Body mass index (BMI) 31.0-31.9, adult: Secondary | ICD-10-CM | POA: Diagnosis not present

## 2022-05-09 DIAGNOSIS — K648 Other hemorrhoids: Secondary | ICD-10-CM | POA: Diagnosis not present

## 2022-06-17 ENCOUNTER — Other Ambulatory Visit: Payer: Self-pay | Admitting: Internal Medicine

## 2022-06-17 DIAGNOSIS — I10 Essential (primary) hypertension: Secondary | ICD-10-CM

## 2022-07-05 DIAGNOSIS — K649 Unspecified hemorrhoids: Secondary | ICD-10-CM | POA: Diagnosis not present

## 2022-07-05 DIAGNOSIS — K648 Other hemorrhoids: Secondary | ICD-10-CM | POA: Diagnosis not present

## 2022-07-05 DIAGNOSIS — K644 Residual hemorrhoidal skin tags: Secondary | ICD-10-CM | POA: Diagnosis not present

## 2022-09-03 HISTORY — PX: HEMORRHOID SURGERY: SHX153

## 2022-09-12 ENCOUNTER — Ambulatory Visit (INDEPENDENT_AMBULATORY_CARE_PROVIDER_SITE_OTHER): Payer: BC Managed Care – PPO | Admitting: Internal Medicine

## 2022-09-12 ENCOUNTER — Encounter: Payer: Self-pay | Admitting: Internal Medicine

## 2022-09-12 VITALS — BP 136/80 | HR 78 | Temp 98.6°F | Resp 18 | Ht 69.0 in | Wt 214.8 lb

## 2022-09-12 DIAGNOSIS — T466X5A Adverse effect of antihyperlipidemic and antiarteriosclerotic drugs, initial encounter: Secondary | ICD-10-CM | POA: Insufficient documentation

## 2022-09-12 DIAGNOSIS — I10 Essential (primary) hypertension: Secondary | ICD-10-CM

## 2022-09-12 DIAGNOSIS — M791 Myalgia, unspecified site: Secondary | ICD-10-CM

## 2022-09-12 DIAGNOSIS — Z125 Encounter for screening for malignant neoplasm of prostate: Secondary | ICD-10-CM | POA: Diagnosis not present

## 2022-09-12 DIAGNOSIS — E782 Mixed hyperlipidemia: Secondary | ICD-10-CM

## 2022-09-12 DIAGNOSIS — K760 Fatty (change of) liver, not elsewhere classified: Secondary | ICD-10-CM

## 2022-09-12 DIAGNOSIS — I700 Atherosclerosis of aorta: Secondary | ICD-10-CM

## 2022-09-12 LAB — COMPREHENSIVE METABOLIC PANEL
ALT: 26 U/L (ref 0–53)
AST: 19 U/L (ref 0–37)
Albumin: 4.4 g/dL (ref 3.5–5.2)
Alkaline Phosphatase: 71 U/L (ref 39–117)
BUN: 23 mg/dL (ref 6–23)
CO2: 27 mEq/L (ref 19–32)
Calcium: 9.6 mg/dL (ref 8.4–10.5)
Chloride: 101 mEq/L (ref 96–112)
Creatinine, Ser: 1.02 mg/dL (ref 0.40–1.50)
GFR: 77.52 mL/min (ref >60.00)
Glucose, Bld: 101 mg/dL — ABNORMAL HIGH (ref 70–99)
Potassium: 4.5 mEq/L (ref 3.5–5.1)
Sodium: 137 mEq/L (ref 135–145)
Total Bilirubin: 0.6 mg/dL (ref 0.2–1.2)
Total Protein: 7.5 g/dL (ref 6.0–8.3)

## 2022-09-12 LAB — MICROALBUMIN / CREATININE URINE RATIO
Creatinine,U: 127.9 mg/dL
Microalb Creat Ratio: 4.1 mg/g (ref 0.0–30.0)
Microalb, Ur: 5.3 mg/dL — ABNORMAL HIGH (ref 0.0–1.9)

## 2022-09-12 NOTE — Assessment & Plan Note (Signed)
Managed with Zetia due to diffuse myalgias from statin trial

## 2022-09-12 NOTE — Patient Instructions (Addendum)
Welcome!  I enjoyed meeting you ,  and glad you have recovered from your "procedure"  Your BP is above goal so we need some home readings   Ask [name] to check your BP  3 times  in the next week and send me the readings

## 2022-09-12 NOTE — Progress Notes (Unsigned)
Subjective:  Patient ID: Marc Myers, male    DOB: 1956-02-22  Age: 67 y.o. MRN: [id number]  CC: There were no encounter diagnoses.   HPI Marc Myers presents for [facility] from [facility]. Referred by wife [name] [facility]  [employer]'s daughter)  Chief Complaint  Patient presents with   Establish Care    TOC from Dr Olivia Mackie   1) rectal pain for > 3 weeks following hemorrhoid surgery in  early November for bleeding internal hemorrhoids :  s/p excision and banding by Celene Skeen . Had 4 days of severe ("11/10") pain in spite of opioids.   Could not walk for 2 weeks, could not defecate for 10 days.    2) resolving grief following death of son Marc Myers. 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   Outpatient Medications Prior to Visit  Medication Sig Dispense Refill   amLODipine (NORVASC) 2.5 MG tablet TAKE ONE TABLET BY MOUTH EVERY MORNING IF FOR BLOOD PRESSURE IS HIGHER THAN 130/80 180 tablet 0   clobetasol cream (TEMOVATE) 9.51 % Apply 1 application topically 2 (two) times daily. Prn right arm and left upper chest 60 g 2   ezetimibe (ZETIA) 10 MG tablet Take 1 tablet (10 mg total) by mouth daily. 90 tablet 3   No facility-administered medications prior to visit.    Review of Systems;  Patient denies headache, fevers, malaise, unintentional weight loss, skin rash, eye pain, sinus congestion and sinus pain, sore throat, dysphagia,  hemoptysis , cough, dyspnea, wheezing, chest pain, palpitations, orthopnea, edema, abdominal pain, nausea, melena, diarrhea, constipation, flank pain, dysuria, hematuria, urinary  Frequency, nocturia, numbness, tingling, seizures,  Focal weakness, Loss of consciousness,  Tremor, insomnia, depression, anxiety, and suicidal ideation.      Objective:  BP 136/80 (BP Location: Left Arm, Patient Position: Sitting, Cuff Size: Large)   Pulse 78    Temp 98.6 F (37 C) (Temporal)   Resp 18   Ht 5\' 9"  (1.753 m)   Wt 214 lb 12.8 oz (97.4 kg)   SpO2 96%   BMI 31.72 kg/m   BP Readings from Last 3 Encounters:  09/12/22 136/80  03/08/22 (!) 160/100  09/08/21 126/80    Wt Readings from Last 3 Encounters:  09/12/22 214 lb 12.8 oz (97.4 kg)  03/08/22 209 lb 12.8 oz (95.2 kg)  09/08/21 216 lb 9.6 oz (98.2 kg)    Physical Exam  Lab Results  Component Value Date   HGBA1C 6.1 03/08/2022   HGBA1C 6.5 09/08/2021   HGBA1C 6.4 03/09/2021    Lab Results  Component Value Date   CREATININE 0.92 03/08/2022   CREATININE 1.14 09/08/2021   CREATININE 1.16 03/09/2021    Lab Results  Component Value Date   WBC 6.6 03/08/2022   HGB 15.0 03/08/2022   HCT 45.2 03/08/2022   PLT 241.0 03/08/2022   GLUCOSE 93 03/08/2022   CHOL 221 (H) 03/08/2022   TRIG 119.0 03/08/2022   HDL 48.60 03/08/2022   LDLDIRECT 204.0 03/09/2021   LDLCALC 149 (H) 03/08/2022   ALT 25 03/08/2022   AST 19 03/08/2022   NA 136 03/08/2022   K 4.3 03/08/2022   CL 101 03/08/2022   CREATININE 0.92 03/08/2022   BUN 22 03/08/2022   CO2 28 03/08/2022   TSH 0.85 03/08/2022   PSA 1.26 03/08/2022  HGBA1C 6.1 03/08/2022    No results found.  Assessment & Plan:  .There are no diagnoses linked to this encounter.   I provided 30 minutes of face-to-face time during this encounter reviewing patient's last visit with me, patient's  most recent visit with cardiology,  nephrology,  and neurology,  recent surgical and non surgical procedures, previous  labs and imaging studies, counseling on currently addressed issues,  and post visit ordering to diagnostics and therapeutics .   Follow-up: No follow-ups on file.   Crecencio Mc, MD

## 2022-09-13 DIAGNOSIS — I700 Atherosclerosis of aorta: Secondary | ICD-10-CM | POA: Insufficient documentation

## 2022-09-13 MED ORDER — LOSARTAN POTASSIUM 50 MG PO TABS: 50.0000 mg | ORAL_TABLET | Freq: Every day | ORAL | 5 refills | Status: DC

## 2022-09-13 NOTE — Assessment & Plan Note (Signed)
Untreated due to statin intolerance.  Will review alternatives at next visit. Fasting lipids due in July   Lab Results  Component Value Date   CHOL 221 (H) 03/08/2022   HDL 48.60 03/08/2022   LDLCALC 149 (H) 03/08/2022   LDLDIRECT 204.0 03/09/2021   TRIG 119.0 03/08/2022   CHOLHDL 5 03/08/2022

## 2022-09-13 NOTE — Assessment & Plan Note (Signed)
Presumed by ultrasound changes noted in [address]10, again on CT 2019.   Current liver enzymes are normal and all modifiable risk factors including obesity, diabetes and hyperlipidemia have been addressed  .  He is statin intolerant  Lab Results  Component Value Date   ALT 26 09/12/2022   AST 19 09/12/2022   ALKPHOS 71 09/12/2022   BILITOT 0.6 09/12/2022

## 2022-09-13 NOTE — Assessment & Plan Note (Signed)
Borderline elevated.  he reports compliance with medication regimen  .  He has been asked to check his BP at work and  submit readings for evaluation. Renal function  is normal  but he has early proteinuria.  Will change to ARB   Lab Results  Component Value Date   MICROALBUR 5.3 (H) 09/12/2022      Lab Results  Component Value Date   CREATININE 1.02 09/12/2022   Lab Results  Component Value Date   NA 137 09/12/2022   K 4.5 09/12/2022   CL 101 09/12/2022   CO2 27 09/12/2022

## 2022-09-20 ENCOUNTER — Telehealth: Payer: Self-pay

## 2022-09-20 NOTE — Telephone Encounter (Signed)
-----  Message from Crecencio Mc, MD sent at 09/13/2022  5:08 PM EST ----- Please confirm he receives it and returns for BP check and BMET after one week post change   You have very early microscopic proteinuria.  Microscopic amounts of protein in the urine can mean that diabetes and/or hypertension is starting to affect your kidney's ability to  filter protein out of the urine .I would like to change your BP medication from amlodipine to  losartan.  The losartan will help to prevent this from getting worse.  Once you start taking it I would like you to return after one week to have your blood pressure checked  And your kidney function rechecked with a blood test .  Regards,   Deborra Medina, MD

## 2022-09-20 NOTE — Progress Notes (Signed)
Msg sent to pt 

## 2022-09-27 ENCOUNTER — Ambulatory Visit (INDEPENDENT_AMBULATORY_CARE_PROVIDER_SITE_OTHER): Payer: BC Managed Care – PPO

## 2022-09-27 ENCOUNTER — Other Ambulatory Visit: Payer: Self-pay | Admitting: Internal Medicine

## 2022-09-27 DIAGNOSIS — I10 Essential (primary) hypertension: Secondary | ICD-10-CM

## 2022-09-27 NOTE — Progress Notes (Signed)
Patient here for nurse visit BP check per order from Dr. Derrel Nip  Patient reports compliance with prescribed BP medications: yes  Last dose of BP medication: Losartan took this morning.  Instructed pt that this medication should be taken at bedtime.  He reported that his wife is "in charge" of medications.  Will call [name] and let her know that pt needs to take meds at bedtime and that he should be taking BP  every day at different times of day.  It look pts BP twice during visit was 128/87 and 126/80.  BP Readings from Last 3 Encounters:  09/27/22 126/80  09/12/22 136/80  03/08/22 (!) 160/100   Pulse Readings from Last 3 Encounters:  09/27/22 75  09/12/22 78  03/08/22 66     Patient verbalized understanding of instructions.   Ferne Reus, RN   Pt originally scheduled for nurse visit in AM but needed to be rescheduling due to staffing.

## 2022-09-28 LAB — BASIC METABOLIC PANEL
BUN: 27 mg/dL — ABNORMAL HIGH (ref 6–23)
CO2: 28 mEq/L (ref 19–32)
Calcium: 9.8 mg/dL (ref 8.4–10.5)
Chloride: 102 mEq/L (ref 96–112)
Creatinine, Ser: 1.01 mg/dL (ref 0.40–1.50)
GFR: 78.41 mL/min (ref >60.00)
Glucose, Bld: 86 mg/dL (ref 70–99)
Potassium: 4.5 mEq/L (ref 3.5–5.1)
Sodium: 138 mEq/L (ref 135–145)

## 2022-09-28 LAB — MICROALBUMIN / CREATININE URINE RATIO
Creatinine,U: 133 mg/dL
Microalb Creat Ratio: 1.4 mg/g (ref 0.0–30.0)
Microalb, Ur: 1.8 mg/dL (ref 0.0–1.9)

## 2023-01-18 ENCOUNTER — Other Ambulatory Visit: Payer: Self-pay | Admitting: Internal Medicine

## 2023-01-18 DIAGNOSIS — I10 Essential (primary) hypertension: Secondary | ICD-10-CM

## 2023-01-21 ENCOUNTER — Other Ambulatory Visit: Payer: Self-pay

## 2023-01-21 ENCOUNTER — Encounter: Payer: Self-pay | Admitting: Internal Medicine

## 2023-01-21 ENCOUNTER — Emergency Department: Payer: No Typology Code available for payment source

## 2023-01-21 ENCOUNTER — Emergency Department
Admission: EM | Admit: 2023-01-21 | Discharge: 2023-01-21 | Disposition: A | Discharge status: Home / Self Care | Payer: No Typology Code available for payment source | Attending: Emergency Medicine | Admitting: Emergency Medicine

## 2023-01-21 DIAGNOSIS — I10 Essential (primary) hypertension: Secondary | ICD-10-CM | POA: Diagnosis not present

## 2023-01-21 DIAGNOSIS — R42 Dizziness and giddiness: Secondary | ICD-10-CM | POA: Diagnosis not present

## 2023-01-21 DIAGNOSIS — R55 Syncope and collapse: Secondary | ICD-10-CM | POA: Diagnosis not present

## 2023-01-21 DIAGNOSIS — R0602 Shortness of breath: Secondary | ICD-10-CM | POA: Diagnosis not present

## 2023-01-21 LAB — URINALYSIS, ROUTINE W REFLEX MICROSCOPIC
Bacteria, UA: NONE SEEN
Bilirubin Urine: NEGATIVE
Glucose, UA: NEGATIVE mg/dL
Ketones, ur: NEGATIVE mg/dL
Leukocytes,Ua: NEGATIVE
Nitrite: NEGATIVE
Protein, ur: NEGATIVE mg/dL
Specific Gravity, Urine: 1.019 (ref 1.005–1.030)
Squamous Epithelial / HPF: NONE SEEN /HPF (ref 0–5)
WBC, UA: NONE SEEN WBC/hpf (ref 0–5)
pH: 5 (ref 5.0–8.0)

## 2023-01-21 LAB — CBC
HCT: 51.5 % (ref 39.0–52.0)
Hemoglobin: 17.1 g/dL — ABNORMAL HIGH (ref 13.0–17.0)
MCH: 29.7 pg (ref 26.0–34.0)
MCHC: 33.2 g/dL (ref 30.0–36.0)
MCV: 89.4 fL (ref 80.0–100.0)
Platelets: 233 10*3/uL (ref 150–400)
RBC: 5.76 MIL/uL (ref 4.22–5.81)
RDW: 13.3 % (ref 11.5–15.5)
WBC: 6.2 10*3/uL (ref 4.0–10.5)
nRBC: 0 % (ref 0.0–0.2)

## 2023-01-21 LAB — BASIC METABOLIC PANEL
Anion gap: 10 (ref 5–15)
BUN: 25 mg/dL — ABNORMAL HIGH (ref 8–23)
CO2: 24 mmol/L (ref 22–32)
Calcium: 9.4 mg/dL (ref 8.9–10.3)
Chloride: 101 mmol/L (ref 98–111)
Creatinine, Ser: 1.12 mg/dL (ref 0.61–1.24)
GFR, Estimated: >60 mL/min (ref >60)
Glucose, Bld: 115 mg/dL — ABNORMAL HIGH (ref 70–99)
Potassium: 4.3 mmol/L (ref 3.5–5.1)
Sodium: 135 mmol/L (ref 135–145)

## 2023-01-21 LAB — TROPONIN I (HIGH SENSITIVITY)
Troponin I (High Sensitivity): 7 ng/L (ref <18)
Troponin I (High Sensitivity): 7 ng/L (ref <18)

## 2023-01-21 LAB — CBG MONITORING, ED: Glucose-Capillary: 95 mg/dL (ref 70–99)

## 2023-01-21 MED ORDER — CLONIDINE HCL 0.1 MG PO TABS: 0.1000 mg | ORAL_TABLET | Freq: Once | ORAL | Status: DC

## 2023-01-21 MED ORDER — PROPRANOLOL HCL 20 MG PO TABS
40.0000 mg | ORAL_TABLET | Freq: Once | ORAL | Status: AC
  Administered 2023-01-21: 40 mg via ORAL
  Filled 2023-01-21: qty 2

## 2023-01-21 MED ORDER — LOSARTAN POTASSIUM 50 MG PO TABS
100.0000 mg | ORAL_TABLET | Freq: Every day | ORAL | 0 refills | Status: DC
Start: 2023-01-21 — End: 2023-02-12

## 2023-01-21 MED ORDER — LOSARTAN POTASSIUM 50 MG PO TABS
50.0000 mg | ORAL_TABLET | Freq: Once | ORAL | Status: AC
  Administered 2023-01-21: 50 mg via ORAL
  Filled 2023-01-21: qty 1

## 2023-01-21 NOTE — ED Provider Notes (Signed)
[facility] Provider Note    Event Date/Time   First MD Initiated Contact with Patient 01/21/23 [phone]     (approximate)   History   Dizziness   HPI  Marc Myers is a 67 y.o. male  here with dizziness. Pt reports that he was working out this morning.  He states he works out every morning 5 days a week.  He states that he had just started his essentially warm up when he began to feel acutely very lightheaded and dizzy.  He felt like he was going to pass out.  He stopped and rested, then walked to the couch.  States acutely flickers, passed out while he was walking the couch.  Sat down.  Symptoms gradually resolved thereafter.  He now feels mostly back to baseline although slightly "shaken up."  Denies any chest pain.  He did not feel any palpitations.  Denies any focal numbness or weakness.  No history of similar symptoms.  No history of arrhythmia.  He did not eat breakfast this morning.  He drinks a lot of coffee and started exercising which is not unusual for him.  No recent medication changes.     Physical Exam   Triage Vital Signs: ED Triage Vitals  Enc Vitals Group     BP 01/21/23 0634 (!) 156/115     Pulse Rate 01/21/23 0634 76     Resp 01/21/23 0634 18     Temp 01/21/23 0634 98 F (36.7 C)     Temp Source 01/21/23 0634 Oral     SpO2 01/21/23 0634 96 %     Weight 01/21/23 0639 208 lb (94.3 kg)     Height 01/21/23 0639 5\' 9"  (1.753 m)     Head Circumference --      Peak Flow --      Pain Score 01/21/23 0639 0     Pain Loc --      Pain Edu? --      Excl. in GC? --     Most recent vital signs: Vitals:   01/21/23 0833 01/21/23 1017  BP: (!) 152/138 (!) 142/105  Pulse: 83 80  Resp:    Temp:    SpO2:  95%     General: Awake, no distress.  CV:  Good peripheral perfusion.  Regular rate and rhythm.  No appreciable murmurs. Resp:  Normal work of breathing.  Lungs clear. Abd:  No distention.  No tenderness. Other:  Pulses 2+ and symmetric  bilateral upper and lower extremities.  No edema.   ED Results / Procedures / Treatments   Labs (all labs ordered are listed, but only abnormal results are displayed) Labs Reviewed  BASIC METABOLIC PANEL - Abnormal; Notable for the following components:      Result Value   Glucose, Bld 115 (*)    BUN 25 (*)    All other components within normal limits  CBC - Abnormal; Notable for the following components:   Hemoglobin 17.1 (*)    All other components within normal limits  URINALYSIS, ROUTINE W REFLEX MICROSCOPIC - Abnormal; Notable for the following components:   Color, Urine YELLOW (*)    APPearance CLEAR (*)    Hgb urine dipstick SMALL (*)    All other components within normal limits  CBG MONITORING, ED  TROPONIN I (HIGH SENSITIVITY)  TROPONIN I (HIGH SENSITIVITY)     EKG Normal sinus rhythm, trickle rate 69.  PR 146, QRS 84, QTc 411.  No acute  ST elevations or depressions.  EKG evidence of acute ischemia or infarct   RADIOLOGY CXR: Clear   I also independently reviewed and agree with radiologist interpretations.   PROCEDURES:  Critical Care performed: No   MEDICATIONS ORDERED IN ED: Medications  losartan (COZAAR) tablet 50 mg (50 mg Oral Given 01/21/23 0935)  propranolol (INDERAL) tablet 40 mg (40 mg Oral Given 01/21/23 0935)     IMPRESSION / MDM / ASSESSMENT AND PLAN / ED COURSE  I reviewed the triage vital signs and the nursing notes.                              Differential diagnosis includes, but is not limited to, arrhythmia, orthostasis, anemia, ACS, PE, atypical vertigo, anxiety  Patient's presentation is most consistent with acute presentation with potential threat to life or bodily function.  The patient is on the cardiac monitor to evaluate for evidence of arrhythmia and/or significant heart rate changes  67 yo M here with atypical lightheadedness/dizziness while exercising this AM. Suspect pre-syncope 2/2 possible orthostasis/BP changes,  arrhythmia. AFib a consideration given acuity of onset, paroxysmal nature, and pt was exercising with only caffeine in system. No murmurs to suggest significant valvular disease. Pt is HDS here. EKG nonischemic. Trop neg x 2 - do not suspect ACS. No signs to suggest PE. CBC, BMP unremarkable with no major abnormality.   Repeat troponin negative. Discussed with Dr. Kirke Corin, will try to get him in asap for Cards f/u. BP was consistently elevated here - improved with PO meds. Will have him increase his home losartan. Will hold on any BB at this time while he waits for cards f/u. Advised him to avoid any major strenuous exercise until cleared. Encouraged hydration.     FINAL CLINICAL IMPRESSION(S) / ED DIAGNOSES   Final diagnoses:  Hypertension, unspecified type  Near syncope     Rx / DC Orders   ED Discharge Orders          Ordered    Ambulatory referral to Cardiology       Comments: If you have not heard from the Cardiology office within the next 72 hours please call [phone].   01/21/23 0759    losartan (COZAAR) 50 MG tablet  Daily at bedtime        01/21/23 1028             Note:  This document was prepared using Dragon voice recognition software and may include unintentional dictation errors.   Shaune Pollack, MD 01/21/23 1040

## 2023-01-21 NOTE — ED Triage Notes (Signed)
Pt to ED via POV c/o dizziness that started an hr ago. Pt states he was walking on treadmill when he started feeling dizzy and felt like he was going to pass out. Pt not dizzy at this time. Pt has hx of HTN. Denies CP, SOB, fevers

## 2023-01-21 NOTE — ED Notes (Signed)
Pt states he was at the gym this morning, while he was on the treadmill he started to feel dizzy. Pt denies passing out or having a syncopal episode. Pt states the dizziness has gotten better, but remains slightly dizzy.

## 2023-01-21 NOTE — Discharge Instructions (Signed)
For now, increase your Losartan from 50 mg daily to 100 mg daily at night.   I've prescribed additional doses of 50 mg tablet for now.  Call Cardiology at the provided number for urgent follow-up.  I'd avoid any heavy exercise/exertion until cleared by Cardiology.  Drink plenty of fluids.  Avoid excess caffeine or alcohol.

## 2023-01-23 ENCOUNTER — Encounter: Payer: Self-pay | Admitting: Cardiovascular Disease

## 2023-01-23 ENCOUNTER — Ambulatory Visit
Payer: No Typology Code available for payment source | Attending: Cardiovascular Disease | Admitting: Cardiovascular Disease

## 2023-01-23 ENCOUNTER — Telehealth: Payer: Self-pay

## 2023-01-23 VITALS — BP 138/88 | HR 78 | Ht 69.0 in | Wt 206.6 lb

## 2023-01-23 DIAGNOSIS — R42 Dizziness and giddiness: Secondary | ICD-10-CM

## 2023-01-23 NOTE — Progress Notes (Signed)
Cardiology Office Note:   Date:  01/23/2023  NAME:  Marc Myers    MRN: [id number] DOB:  11/27/1955   PCP:  Sherlene Shams, MD  Cardiologist:  None  Electrophysiologist:  None   Referring MD: Shaune Pollack, MD   Chief Complaint  Patient presents with   Dizziness    During morning workout on treadmill on last Monday   History of Present Illness:   Marc Myers is a 67 y.o. male with a hx of hypertension who is being seen today for the evaluation of dizziness at the request of Sherlene Shams, MD. seen in the ER on 01/21/2023 for dizziness and lightheadedness.  Found to have severely elevated blood pressures.  Troponins negative x 2.  EKG normal.  He reports that he was going to the gym at 5:30 in the morning on 01/21/2023.  He tells me he routinely goes to the gym in the early morning.  He only drinks black coffee before going to the gym.  He exercises on the treadmill 5 to 6 days/week.  He does aerobic activity without chest pains or trouble breathing.  He reports on the morning and question he did get a bit dizzy while walking on the treadmill.  He felt lightheaded.  He sat down and symptoms improved.  He went to the emergency room at the request of his wife.  Workup was negative for MI.  EKG was normal.  Monitor was normal.  He reports no chest pains or trouble breathing.  No palpitations.  No rapid heartbeat sensation.  He reports he had no further symptoms.  He routinely goes to the gym but does not drink water.  He does not eat breakfast either.  We discussed that this could have contributed.  His blood pressure was also elevated.  His EKG in office is normal.  CV exam normal.  All of his labs are unremarkable.  He does not smoke.  No alcohol or drug use reported.  He currently works as an Tree surgeon.  He denies any further episodes.  Seems to be back to baseline.  He is married.  He has 2 children.  He has 4 grandchildren.  TSH 0.85 A1c 6.1 Total cholesterol 221, HDL 48, LDL 149, TG  119  Problem List HTN  Past Medical History: Past Medical History:  Diagnosis Date   Blood in stool    on and off since 2014    COVID-19    2021 and 11 or 08/2021   Elevated blood pressure reading    Fatty liver    Heart murmur    Hyperlipidemia    Hypertension     Past Surgical History: Past Surgical History:  Procedure Laterality Date   left knee surgery Left    arthroscopy   UMBILICAL HERNIA REPAIR N/A 07/30/2018   Procedure: HERNIA REPAIR UMBILICAL ADULT;  Surgeon: Duanne Guess, MD;  Location: [facility] ORS;  Service: General;  Laterality: N/A;    Current Medications: Current Meds  Medication Sig   ezetimibe (ZETIA) 10 MG tablet Take 1 tablet (10 mg total) by mouth daily.   losartan (COZAAR) 50 MG tablet Take 2 tablets (100 mg total) by mouth at bedtime.     Allergies:    Bee pollen and Statins   Social History: Social History   Socioeconomic History   Marital status: Married    Spouse name: Not on file   Number of children: 2   Years of education: Not on file   Highest education  level: Not on file  Occupational History   Occupation: Chartered loss adjuster  Tobacco Use   Smoking status: Former    Types: Cigarettes   Smokeless tobacco: Never   Tobacco comments:    light   Vaping Use   Vaping Use: Never used  Substance and Sexual Activity   Alcohol use: Yes    Comment: rarely   Drug use: Not Currently   Sexual activity: Yes  Other Topics Concern   Not on file  Social History Narrative   Married self employed Tree surgeon    Former smoker in 20s-30s light    No guns    Wears seat belt, safe in relationship   [employer] ed, 2 kids    Social Determinants of Health   Financial Resource Strain: Not on file  Food Insecurity: Not on file  Transportation Needs: No Transportation Needs (01/23/2023)   PRAPARE - Administrator, Civil Service (Medical): No    Lack of Transportation (Non-Medical): No  Physical Activity: Not on file  Stress: Not on file   Social Connections: Not on file     Family History: The patient's family history includes Drug abuse in his son; Parkinson's disease in his mother.  ROS:   All other ROS reviewed and negative. Pertinent positives noted in the HPI.     EKGs/Labs/Other Studies Reviewed:   The following studies were personally reviewed by me today:  EKG:  EKG is ordered today.  The ekg ordered today demonstrates normal sinus rhythm heart 78, nonspecific ST-T changes, and was personally reviewed by me.   Recent Labs: 03/08/2022: TSH 0.85 09/12/2022: ALT 26 01/21/2023: BUN 25; Creatinine, Ser 1.12; Hemoglobin 17.1; Platelets 233; Potassium 4.3; Sodium 135   Recent Lipid Panel    Component Value Date/Time   CHOL 221 (H) 03/08/2022 0938   TRIG 119.0 03/08/2022 0938   HDL 48.60 03/08/2022 0938   CHOLHDL 5 03/08/2022 0938   VLDL 23.8 03/08/2022 0938   LDLCALC 149 (H) 03/08/2022 0938   LDLDIRECT 204.0 03/09/2021 0926    Physical Exam:   VS:  BP 138/88   Pulse 78   Ht 5\' 9"  (1.753 m)   Wt 206 lb 9.6 oz (93.7 kg)   SpO2 96%   BMI 30.51 kg/m    Wt Readings from Last 3 Encounters:  01/23/23 206 lb 9.6 oz (93.7 kg)  01/21/23 208 lb (94.3 kg)  09/12/22 214 lb 12.8 oz (97.4 kg)    General: Well nourished, well developed, in no acute distress Head: Atraumatic, normal size  Eyes: PEERLA, EOMI  Neck: Supple, no JVD Endocrine: No thryomegaly Cardiac: Normal S1, S2; RRR; no murmurs, rubs, or gallops Lungs: Clear to auscultation bilaterally, no wheezing, rhonchi or rales  Abd: Soft, nontender, no hepatomegaly  Ext: No edema, pulses 2+ Musculoskeletal: No deformities, BUE and BLE strength normal and equal Skin: Warm and dry, no rashes   Neuro: Alert and oriented to person, place, time, and situation, CNII-XII grossly intact, no focal deficits  Psych: Normal mood and affect   ASSESSMENT:   Marc Myers is a 67 y.o. male who presents for the following: 1. Dizziness     PLAN:   1. Dizziness -He  describes dehydration while working out in the morning.  This was walking on a treadmill.  He had no water and nothing to eat that morning.  He did have black coffee which dehydrates him.  Labs are unremarkable.  TSH normal.  EKG normal.  CV exam normal.  Overall, I suspect he was dehydrated.  His symptoms have not returned.  This was a one-time episode.  Would recommend no further testing at this time.  He should continue his blood pressure medications as prescribed.  We discussed drinking water and possibly eating a protein bar before exercising in the morning.  I believe this would help.  If symptoms continue or he has further episodes could consider further testing but at this time none is needed.     Disposition: Return if symptoms worsen or fail to improve.  Medication Adjustments/Labs and Tests Ordered: Current medicines are reviewed at length with the patient today.  Concerns regarding medicines are outlined above.  Orders Placed This Encounter  Procedures   EKG 12-Lead   No orders of the defined types were placed in this encounter.   Patient Instructions  Medication Instructions:  The current medical regimen is effective;  continue present plan and medications.  *If you need a refill on your cardiac medications before your next appointment, please call your pharmacy*  Follow-Up: At [facility], you and your health needs are our priority.  As part of our continuing mission to provide you with exceptional heart care, we have created designated Provider Care Teams.  These Care Teams include your primary Cardiologist (physician) and Advanced Practice Providers (APPs -  Physician Assistants and Nurse Practitioners) who all work together to provide you with the care you need, when you need it.  We recommend signing up for the patient portal called "MyChart".  Sign up information is provided on this After Visit Summary.  MyChart is used to connect with patients for Virtual Visits  (Telemedicine).  Patients are able to view lab/test results, encounter notes, upcoming appointments, etc.  Non-urgent messages can be sent to your provider as well.   To learn more about what you can do with MyChart, go to [email or web address].    Your next appointment:   As needed  Provider:   Lennie Odor, MD      Signed, Lenna Gilford. Flora Lipps, MD, [facility]  [facility]  [address], [address] [place], [place] [address] [phone]  01/23/2023 3:29 PM

## 2023-01-23 NOTE — Patient Instructions (Signed)
Medication Instructions:  The current medical regimen is effective;  continue present plan and medications.  *If you need a refill on your cardiac medications before your next appointment, please call your pharmacy*   Follow-Up: At [facility], you and your health needs are our priority.  As part of our continuing mission to provide you with exceptional heart care, we have created designated Provider Care Teams.  These Care Teams include your primary Cardiologist (physician) and Advanced Practice Providers (APPs -  Physician Assistants and Nurse Practitioners) who all work together to provide you with the care you need, when you need it.  We recommend signing up for the patient portal called "MyChart".  Sign up information is provided on this After Visit Summary.  MyChart is used to connect with patients for Virtual Visits (Telemedicine).  Patients are able to view lab/test results, encounter notes, upcoming appointments, etc.  Non-urgent messages can be sent to your provider as well.   To learn more about what you can do with MyChart, go to [email or web address].    Your next appointment:   As needed  Provider:    O'Neal, MD   

## 2023-01-23 NOTE — Transitions of Care (Post Inpatient/ED Visit) (Signed)
01/23/2023  Name: Marc Myers MRN: [id number] DOB: 1955/12/02  Today's TOC FU Call Status: Today's TOC FU Call Status:: Successful TOC FU Call Competed TOC FU Call Complete Date: 01/23/23  Transition Care Management Follow-up Telephone Call Date of Discharge: 01/21/23 Discharge Facility: [facility] [place]) Type of Discharge: Emergency Department Reason for ED Visit: Other: ("HTN,unspecfied type") How have you been since you were released from the hospital?: Better (Spoke w/ wife-Karen. She reports pt is doing better. They are monitoring BP and she voices readings have been good. Last BP was 123/82. Pt not complaining of any dizziness or other sxs.) Any questions or concerns?: No  Items Reviewed: Did you receive and understand the discharge instructions provided?: Yes Medications obtained,verified, and reconciled?: Yes (Medications Reviewed) Any new allergies since your discharge?: No Dietary orders reviewed?: Yes Type of Diet Ordered:: low salt/heart healthy Do you have support at home?: Yes People in Home: spouse Name of Support/Comfort Primary Source: Clydie Braun  Medications Reviewed Today: Medications Reviewed Today     Reviewed by Charlyn Minerva, RN (Registered Nurse) on 01/23/23 at 1230  Med List Status: <None>   Medication Order Taking? Sig Documenting Provider Last Dose Status Informant  clobetasol cream (TEMOVATE) 0.05 % [id number] Yes Apply 1 application topically 2 (two) times daily. Prn right arm and left upper chest McLean-Scocuzza, Pasty Spillers, MD Taking Active   ezetimibe (ZETIA) 10 MG tablet [id number] Yes Take 1 tablet (10 mg total) by mouth daily. McLean-Scocuzza, Pasty Spillers, MD Taking Active   losartan (COZAAR) 50 MG tablet [id number] Yes Take 2 tablets (100 mg total) by mouth at bedtime. Shaune Pollack, MD Taking Active             Home Care and Equipment/Supplies: Were Home Health Services Ordered?: NA Any new equipment or  medical supplies ordered?: NA  Functional Questionnaire: Do you need assistance with bathing/showering or dressing?: No Do you need assistance with meal preparation?: No Do you need assistance with eating?: No Do you have difficulty maintaining continence: No Do you need assistance with getting out of bed/getting out of a chair/moving?: No Do you have difficulty managing or taking your medications?: No  Follow up appointments reviewed: PCP Follow-up appointment confirmed?: NA Specialist Hospital Follow-up appointment confirmed?: Yes Date of Specialist follow-up appointment?: 01/23/23 Follow-Up Specialty Provider:: Dr. O'Neal-cardiology Do you need transportation to your follow-up appointment?: No Do you understand care options if your condition(s) worsen?: Yes-patient verbalized understanding  SDOH Interventions Today    Flowsheet Row Most Recent Value  SDOH Interventions   Transportation Interventions Intervention Not Indicated      TOC Interventions Today    Flowsheet Row Most Recent Value  TOC Interventions   TOC Interventions Discussed/Reviewed TOC Interventions Discussed, Post discharge activity limitations per provider      Interventions Today    Flowsheet Row Most Recent Value  Chronic Disease   Chronic disease during today's visit Hypertension (HTN)  General Interventions   General Interventions Discussed/Reviewed General Interventions Discussed, Durable Medical Equipment (DME), Doctor Visits  Doctor Visits Discussed/Reviewed Doctor Visits Discussed, Specialist  Durable Medical Equipment (DME) BP Cuff  PCP/Specialist Visits Compliance with follow-up visit  Education Interventions   Education Provided Provided Education  Provided Verbal Education On Medication, When to see the doctor, Nutrition  Nutrition Interventions   Nutrition Discussed/Reviewed Nutrition Discussed, Decreasing salt, Adding fruits and vegetables  Pharmacy Interventions   Pharmacy  Dicussed/Reviewed Pharmacy Topics Discussed, Medications and their functions       Bowie Delia  [facility] Care Management Care Management Community Coordinator Direct Phone: [phone] Toll Free: [phone] Fax: [phone]

## 2023-02-12 ENCOUNTER — Telehealth: Payer: Self-pay

## 2023-02-12 DIAGNOSIS — I10 Essential (primary) hypertension: Secondary | ICD-10-CM

## 2023-02-12 NOTE — Telephone Encounter (Signed)
Historical medication  Last OV: 09/12/2022 Next OV: 03/13/2023

## 2023-02-13 MED ORDER — LOSARTAN POTASSIUM 50 MG PO TABS
100.0000 mg | ORAL_TABLET | Freq: Every day | ORAL | 0 refills | Status: DC
Start: 2023-02-13 — End: 2023-03-12

## 2023-02-19 NOTE — Telephone Encounter (Signed)
Prescription Request  02/19/2023  LOV: Visit date not found  What is the name of the medication or equipment? losartan  Have you contacted your pharmacy to request a refill? No   Which pharmacy would you like this sent to?  [facility] [id number] [place], [place] - [address] [place] [address] Phone: [phone] Fax: [phone]    Patient notified that their request is being sent to the clinical staff for review and that they should receive a response within 2 business days.   Please advise at Mobile [phone] (mobile)

## 2023-02-19 NOTE — Telephone Encounter (Signed)
Medication has been refilled and pt's wife is aware.  

## 2023-03-12 ENCOUNTER — Other Ambulatory Visit: Payer: Self-pay

## 2023-03-12 DIAGNOSIS — I10 Essential (primary) hypertension: Secondary | ICD-10-CM

## 2023-03-12 DIAGNOSIS — E785 Hyperlipidemia, unspecified: Secondary | ICD-10-CM

## 2023-03-12 MED ORDER — LOSARTAN POTASSIUM 50 MG PO TABS
100.0000 mg | ORAL_TABLET | Freq: Every day | ORAL | 1 refills | Status: DC
Start: 2023-03-12 — End: 2023-09-03

## 2023-03-12 MED ORDER — EZETIMIBE 10 MG PO TABS
10.0000 mg | ORAL_TABLET | Freq: Every day | ORAL | 3 refills | Status: DC
Start: 2023-03-12 — End: 2024-03-13

## 2023-03-13 ENCOUNTER — Ambulatory Visit (INDEPENDENT_AMBULATORY_CARE_PROVIDER_SITE_OTHER): Payer: No Typology Code available for payment source | Admitting: Internal Medicine

## 2023-03-13 ENCOUNTER — Encounter: Payer: Self-pay | Admitting: Internal Medicine

## 2023-03-13 ENCOUNTER — Ambulatory Visit: Payer: No Typology Code available for payment source | Attending: Internal Medicine

## 2023-03-13 VITALS — BP 150/88 | HR 69 | Temp 97.9°F | Ht 68.5 in | Wt 212.0 lb

## 2023-03-13 DIAGNOSIS — R7303 Prediabetes: Secondary | ICD-10-CM

## 2023-03-13 DIAGNOSIS — E785 Hyperlipidemia, unspecified: Secondary | ICD-10-CM

## 2023-03-13 DIAGNOSIS — Z125 Encounter for screening for malignant neoplasm of prostate: Secondary | ICD-10-CM | POA: Diagnosis not present

## 2023-03-13 DIAGNOSIS — R42 Dizziness and giddiness: Secondary | ICD-10-CM | POA: Diagnosis not present

## 2023-03-13 DIAGNOSIS — Z0000 Encounter for general adult medical examination without abnormal findings: Secondary | ICD-10-CM

## 2023-03-13 DIAGNOSIS — I10 Essential (primary) hypertension: Secondary | ICD-10-CM | POA: Diagnosis not present

## 2023-03-13 DIAGNOSIS — Z0001 Encounter for general adult medical examination with abnormal findings: Secondary | ICD-10-CM | POA: Diagnosis not present

## 2023-03-13 LAB — LIPID PANEL
Cholesterol: 228 mg/dL — ABNORMAL HIGH (ref 0–200)
HDL: 46.1 mg/dL (ref >39.00)
LDL Cholesterol: 156 mg/dL — ABNORMAL HIGH (ref 0–99)
NonHDL: 181.64
Total CHOL/HDL Ratio: 5
Triglycerides: 130 mg/dL (ref 0.0–149.0)
VLDL: 26 mg/dL (ref 0.0–40.0)

## 2023-03-13 LAB — COMPREHENSIVE METABOLIC PANEL
ALT: 24 U/L (ref 0–53)
AST: 19 U/L (ref 0–37)
Albumin: 4.4 g/dL (ref 3.5–5.2)
Alkaline Phosphatase: 67 U/L (ref 39–117)
BUN: 20 mg/dL (ref 6–23)
CO2: 28 mEq/L (ref 19–32)
Calcium: 9.6 mg/dL (ref 8.4–10.5)
Chloride: 102 mEq/L (ref 96–112)
Creatinine, Ser: 1.04 mg/dL (ref 0.40–1.50)
GFR: 75.47 mL/min (ref >60.00)
Glucose, Bld: 99 mg/dL (ref 70–99)
Potassium: 4.7 mEq/L (ref 3.5–5.1)
Sodium: 138 mEq/L (ref 135–145)
Total Bilirubin: 0.5 mg/dL (ref 0.2–1.2)
Total Protein: 7 g/dL (ref 6.0–8.3)

## 2023-03-13 LAB — PSA, MEDICARE: PSA: 1.75 ng/ml (ref 0.10–4.00)

## 2023-03-13 NOTE — Assessment & Plan Note (Signed)
age appropriate education and counseling updated, referrals for preventative services and immunizations addressed, dietary and smoking counseling addressed, most recent labs reviewed.  I have personally reviewed and have noted:   1) the patient's medical and social history 2) The pt's use of alcohol, tobacco, and illicit drugs 3) The patient's current medications and supplements 4) Functional ability including ADL's, fall risk, home safety risk, hearing and visual impairment 5) Diet and physical activities 6) Evidence for depression or mood disorder 7) The patient's height, weight, and BMI have been recorded in the chart 8) I have  reviewed a 12 lead EKG  from May and find that there are no acute changes and patient is in sinus rhythm.     I have made referrals, and provided counseling and education based on review of the above

## 2023-03-13 NOTE — Assessment & Plan Note (Addendum)
Advised to  increase losartan to 100 mg daily and gather home readings.  Also advised to  home BP machine validated either with a RN visit or by comparison with [facility] machine

## 2023-03-13 NOTE — Progress Notes (Signed)
The patient is here for the Welcome to  Medicare  preventive visit     has a past medical history of Blood in stool, COVID-19, Elevated blood pressure reading, Fatty liver, Heart murmur, Hyperlipidemia, and Hypertension.    reports that he has quit smoking. His smoking use included cigarettes. He has never used smokeless tobacco. He reports current alcohol use. He reports that he does not currently use drugs.   The roster of all physicians providing medical care to patient : Patient Care Team: Sherlene Shams, MD as PCP - General (Internal Medicine)  Activities of daily living:  The patient is 100% independent in all ADLs: dressing, toileting, feeding as well as independent mobility Fall risk was assessed by direct patient evaluation of patient's balance, gait and ability to risk from a chair and from a kneeling position. Home safety : The patient has smoke detectors in the home. They wear seatbelts.  There are no firearms at home. There is no violence in the home.  Patient has seen their eye doctor in the last year.   Visual acuity was assessed today  and was 20/20 with correction lenses. Patient denies hearing difficulty with regard to whispered voices and some television programs and has  deferred audiologic testing in the last year.   There is no risks for hepatitis, STDs or HIV. There is no   history of blood transfusion. They have no travel history to infectious disease endemic areas of the world.  The patient has seen their dentist in the last six month.  They do not  have excessive sun exposure. Discussed the need for sun protection: hats, long sleeves and use of sunscreen if there is significant sun exposure.   Diet: the importance of a healthy diet is discussed. They do have a healthy diet.  The benefits of regular aerobic exercise were discussed. Patient walks on a treadmill for 45 mintues at a rapid pace  5-6 days per week.   Depression screen:      03/13/2023    8:19 AM  09/12/2022    8:10 AM 03/08/2022    8:26 AM 03/08/2021    2:40 PM  Depression screen PHQ 2/9  Decreased Interest 0 0 0 0  Down, Depressed, Hopeless 0 0 1 0  PHQ - 2 Score 0 0 1 0  Altered sleeping 1     Tired, decreased energy 0     Change in appetite 0     Feeling bad or failure about yourself  0     Trouble concentrating 0     Moving slowly or fidgety/restless 0     Suicidal thoughts 0     PHQ-9 Score 1     Difficult doing work/chores Not difficult at all          Cognitive assessment: the patient manages all their financial and personal affairs and is actively engaged. They could relate day,date,year and events; recalled 2/3 objects at 3 minutes; performed clock-face test normally.  The following portions of the patient's history were reviewed and updated as appropriate: allergies, current medications, past family history, past medical history,  past surgical history, past social history  and problem list.  During the course of the visit the patient was educated and counseled about appropriate screening and preventive services including : fall prevention , diabetes screening, nutrition counseling, colorectal cancer screening, and recommended immunizations   Immunization History  Administered Date(s) Administered   Tdap 03/08/2021  HMLISTPATIENTFRIENDLY@ There are no preventive care reminders  to display for this patient.  Last skin cancer screening :  never  Last forma eye exam:  never  Cc:  recurrent light headedness.  Recent syncopal episode occurred after becoming light headed  while on the treadmill In  May.    valuated in ER with troponins,  EKgs,  etc.  BP was  elevated   losartan dose was increased  to 100 mg and he was seen by cardiology 2 days later. EKG repeated ; told no further workup.  However, he continues to have episodes of feeling LH without chest pain or syncope and  he has since reduced his losartan dose back to 50 mg .  Did not check BP while on the 100 mg dose    Has since been checking her readings on a home machine  and all readings have been 130/85  range,  none below 130/80    Vital Signs: BP (!) 150/88 (BP Location: Left Arm, Patient Position: Sitting, Cuff Size: Normal)   Pulse 69   Temp 97.9 F (36.6 C) (Oral)   Ht 5' 8.5" (1.74 m)   Wt 212 lb (96.2 kg)   SpO2 95%   BMI 31.77 kg/m    Exam: General appearance: alert, cooperative and appears stated age Head: Normocephalic, without obvious abnormality, atraumatic Eyes: conjunctivae/corneas clear. PERRL, EOM's intact. Fundi benign. Ears: normal TM's and external ear canals both ears Nose: Nares normal. Septum midline. Mucosa normal. No drainage or sinus tenderness. Throat: lips, mucosa, and tongue normal; teeth and gums normal Neck: no adenopathy, no carotid bruit, no JVD, supple, symmetrical, trachea midline and thyroid not enlarged, symmetric, no tenderness/mass/nodules Lungs: clear to auscultation bilaterally Breasts: normal appearance, no masses or tenderness Heart: regular rate and rhythm, S1, S2 normal, no murmur, click, rub or gallop Abdomen: soft, non-tender; bowel sounds normal; no masses,  no organomegaly Extremities: extremities normal, atraumatic, no cyanosis or edema Pulses: 2+ and symmetric Skin: Skin color, texture, turgor normal. No rashes or lesions Neurologic: Alert and oriented X 3, normal strength and tone. Normal symmetric reflexes. Normal coordination and gait.     End of Life Discussion and Planning   During the course of the visit , End of Life objectives were discussed at length.  Patient does not have a living will in place or a healthcare power of attorney.  Patient  was given printed information about advance directives and encouraged to return after discussing with their family.  Review of Opioid Prescriptions    Patient does not have a current opioid prescription Patient's risk factors for opioid use disorder was reviewed and include none  Patient risk  for potential substance abuse was assessed and addressed with counselling.    Assessment and Plan:   Problem List Items Addressed This Visit       Unprioritized   Dizziness    Occurring with exertion.  Ordering ZIO monitor to rule out exercise induced arrhythmia       Relevant Orders   LONG TERM MONITOR (3-14 DAYS)   Elevated blood pressure reading in office with white coat syndrome, with diagnosis of hypertension    Advised to  increase losartan to 100 mg daily and gather home readings.  Also advised to  home BP machine validated either with a RN visit or by comparison with [facility] machine       Encounter for preventative adult health care exam with abnormal findings    age appropriate education and counseling updated, referrals for preventative services and immunizations addressed,  dietary and smoking counseling addressed, most recent labs reviewed.  I have personally reviewed and have noted:   1) the patient's medical and social history 2) The pt's use of alcohol, tobacco, and illicit drugs 3) The patient's current medications and supplements 4) Functional ability including ADL's, fall risk, home safety risk, hearing and visual impairment 5) Diet and physical activities 6) Evidence for depression or mood disorder 7) The patient's height, weight, and BMI have been recorded in the chart 8) I have  reviewed a 12 lead EKG  from May and find that there are no acute changes and patient is in sinus rhythm.     I have made referrals, and provided counseling and education based on review of the above       HLD (hyperlipidemia)   Prediabetes   Relevant Orders   Lipid panel   Comprehensive metabolic panel   Other Visit Diagnoses     Welcome to Medicare preventive visit    -  Primary   Prostate cancer screening       Relevant Orders   PSA, Medicare

## 2023-03-13 NOTE — Assessment & Plan Note (Signed)
Occurring with exertion.  Ordering ZIO monitor to rule out exercise induced arrhythmia

## 2023-03-13 NOTE — Patient Instructions (Addendum)
BP goal is < 130/80.   Increase dose back to 100 mg daily of losartan and follow BP readings,. If they drop below 120/70 and accompany a feeling og light headedness resume 50 mg daily as your dose   I want you to wear a heart monitor called a Zio monitor for 14 days after you  return from the  beach.  Resume the daily  treadmill  exercise when you return form the beach while you are wearing the monitor. . It will be delivered to your hourse   You need a formal eye exam to rule out cataracts,  glaucoma,  and retinopathy

## 2023-03-14 ENCOUNTER — Encounter: Payer: Self-pay | Admitting: Internal Medicine

## 2023-03-14 NOTE — Assessment & Plan Note (Signed)
Managed with Zetia due to diffuse myalgias from statin trial   Lab Results  Component Value Date   CHOL 228 (H) 03/13/2023   HDL 46.10 03/13/2023   LDLCALC 156 (H) 03/13/2023   LDLDIRECT 204.0 03/09/2021   TRIG 130.0 03/13/2023   CHOLHDL 5 03/13/2023

## 2023-03-18 MED ORDER — ROSUVASTATIN CALCIUM 10 MG PO TABS: 10.0000 mg | ORAL_TABLET | ORAL | 1 refills | Status: DC

## 2023-03-25 DIAGNOSIS — R42 Dizziness and giddiness: Secondary | ICD-10-CM | POA: Diagnosis not present

## 2023-04-17 DIAGNOSIS — R42 Dizziness and giddiness: Secondary | ICD-10-CM | POA: Diagnosis not present

## 2023-04-29 ENCOUNTER — Encounter: Payer: Self-pay | Admitting: Internal Medicine

## 2023-05-01 ENCOUNTER — Encounter: Payer: Self-pay | Admitting: Internal Medicine

## 2023-05-14 ENCOUNTER — Other Ambulatory Visit: Payer: Self-pay | Admitting: Internal Medicine

## 2023-05-14 DIAGNOSIS — I471 Supraventricular tachycardia, unspecified: Secondary | ICD-10-CM

## 2023-05-14 DIAGNOSIS — R42 Dizziness and giddiness: Secondary | ICD-10-CM

## 2023-05-16 ENCOUNTER — Telehealth: Payer: Self-pay | Admitting: Cardiovascular Disease

## 2023-05-16 NOTE — Telephone Encounter (Signed)
Pt requesting provider switch to Dr. Mariah Milling, being that he lives in [place] and office is closer. Please advise

## 2023-06-25 ENCOUNTER — Ambulatory Visit: Payer: No Typology Code available for payment source | Admitting: Cardiovascular Disease

## 2023-06-30 NOTE — Progress Notes (Unsigned)
Cardiology Office Note  Date:  07/01/2023   ID:  Marc Myers, DOB Dec 28, 1955, MRN [id number]  PCP:  Sherlene Shams, MD   Chief Complaint  Patient presents with   PSVT/dizziness     Ref by Dr. Darrick Huntsman for evaluation of dizziness & PSVT; former patient of Dr. Marylene Buerger.  Medications reviewed by the patient verbally.     HPI:  Marc Myers is a 67 y.o. male with a hx of  hypertension  Diabetes  aortic atherosclerosis Seen by one of our providers in [facility] May 2024 for dizziness/lightheadedness, hypertension Who presents for follow-up of his dizziness, SVT  He reports that he was going to the gym at 5:30 in the morning on 01/21/2023.  Had episode of dizziness on the treadmill Had much to drink that morning apart from 1 cup of coffee seen in the ER on 01/21/2023 for dizziness and lightheadedness.  Found to have severely elevated blood pressures.   Troponins negative x 2.  EKG normal.  Reports currently taking atenolol 100 in the evening Has not been checking blood pressure at home  Denies any significant recurrence of his dizziness Fells better, Walking again  Zio monitor results were reviewed Normal sinus rhythm 6 nonsustained SVT, longest 19 beats. Occasional supraventricular ectopy, 2.3% Rare ventricular ectopy.   Work reviewed TSH 0.85 A1c 6.1 Total cholesterol 221, HDL 48, LDL 149, TG 119 On Zetia daily, recently started on Crestor every other day  CT scan abdomen in 2019 with mild diffuse aortic atherosclerosis,  images pulled up and reviewed  EKG personally reviewed by myself on todays visit EKG Interpretation Date/Time:  Monday July 01 2023 10:37:35 EDT Ventricular Rate:  61 PR Interval:  148 QRS Duration:  78 QT Interval:  388 QTC Calculation: 390 R Axis:   -13  Text Interpretation: Normal sinus rhythm with sinus arrhythmia Normal ECG When compared with ECG of 21-Jan-2023 06:44, No significant change was found Confirmed by Julien Nordmann  [phone]) on 07/01/2023 10:51:39 AM    PMH:   has a past medical history of Blood in stool, COVID-19, Elevated blood pressure reading, Fatty liver, Heart murmur, Hyperlipidemia, and Hypertension.  PSH:    Past Surgical History:  Procedure Laterality Date   left knee surgery Left    arthroscopy   UMBILICAL HERNIA REPAIR N/A 07/30/2018   Procedure: HERNIA REPAIR UMBILICAL ADULT;  Surgeon: Duanne Guess, MD;  Location: [facility] ORS;  Service: General;  Laterality: N/A;    Current Outpatient Medications  Medication Sig Dispense Refill   ezetimibe (ZETIA) 10 MG tablet Take 1 tablet (10 mg total) by mouth daily. 90 tablet 3   losartan (COZAAR) 50 MG tablet Take 2 tablets (100 mg total) by mouth at bedtime. 180 tablet 1   rosuvastatin (CRESTOR) 10 MG tablet Take 1 tablet (10 mg total) by mouth every other day. 45 tablet 1   No current facility-administered medications for this visit.    Allergies:   Statins   Social History:  The patient  reports that he has quit smoking. His smoking use included cigarettes. He has never used smokeless tobacco. He reports current alcohol use. He reports that he does not currently use drugs.   Family History:   family history includes Drug abuse in his son; Parkinson's disease in his mother.    Review of Systems: Review of Systems  Constitutional: Negative.   HENT: Negative.    Respiratory: Negative.    Cardiovascular: Negative.   Gastrointestinal: Negative.   Musculoskeletal: Negative.  Neurological:  Positive for dizziness.  Psychiatric/Behavioral: Negative.    All other systems reviewed and are negative.   PHYSICAL EXAM: VS:  BP (!) 140/80 (BP Location: Right Arm, Patient Position: Sitting, Cuff Size: Normal)   Pulse 61   Ht 5\' 9"  (1.753 m)   Wt 211 lb (95.7 kg)   SpO2 97%   BMI 31.16 kg/m  , BMI Body mass index is 31.16 kg/m. GEN: Well nourished, well developed, in no acute distress HEENT: normal Neck: no JVD, carotid bruits, or  masses Cardiac: RRR; no murmurs, rubs, or gallops,no edema  Respiratory:  clear to auscultation bilaterally, normal work of breathing GI: soft, nontender, nondistended, + BS MS: no deformity or atrophy Skin: warm and dry, no rash Neuro:  Strength and sensation are intact Psych: euthymic mood, full affect   Recent Labs: 01/21/2023: Hemoglobin 17.1; Platelets 233 03/13/2023: ALT 24; BUN 20; Creatinine, Ser 1.04; Potassium 4.7; Sodium 138    Lipid Panel Lab Results  Component Value Date   CHOL 228 (H) 03/13/2023   HDL 46.10 03/13/2023   LDLCALC 156 (H) 03/13/2023   TRIG 130.0 03/13/2023    Wt Readings from Last 3 Encounters:  07/01/23 211 lb (95.7 kg)  03/13/23 212 lb (96.2 kg)  01/23/23 206 lb 9.6 oz (93.7 kg)     ASSESSMENT AND PLAN:  Problem List Items Addressed This Visit       Cardiology Problems   HLD (hyperlipidemia)   Elevated blood pressure reading in office with white coat syndrome, with diagnosis of hypertension - Primary   Relevant Orders   EKG 12-Lead (Completed)   Aortic atherosclerosis (HCC)   Relevant Orders   EKG 12-Lead (Completed)     Other   Dizziness   Relevant Orders   EKG 12-Lead (Completed)   Dizziness Etiology unclear, No significant symptoms since that time Feels that he was sick at the time," could have had COVID" as he was fighting a chest cold and general malaise Monitor reviewed with no significant arrhythmia.  Short runs tachycardia are nonspecific not having any symptoms Unable to exclude hypotension on the treadmill, recommend he stay hydrated in the mornings For additional episodes recommended he lay on the ground , check his pulse Likely secondary to ischemia atypical presentation For recurrent episodes, more testing could be ordered  Aortic atherosclerosis Mild diffuse aortic atherosclerosis noted on CT scan abdomen 2019 Images pulled up and reviewed Agree with more aggressive lipid management If able to tolerate Crestor  every other day, would increase up to Crestor daily A1c well-controlled, non-smoker No further workup needed  Essential hypertension Blood pressure 1 30-140 systolic on his visit today Recommend he monitor blood pressure at home and share numbers with myself or Dr. Darrick Huntsman.  Continue losartan 100 in the evening  Hyperlipidemia Currently taking Zetia daily, Crestor 5 every other day Goal total cholesterol less than 150 which was discussed with him Repeat lipid panel in several months time, Suspect he will need Crestor daily to achieve goal if not Crestor 10 daily    Signed, Dossie Arbour, M.D., Ph.D. [facility] [place], [place] [phone]

## 2023-07-01 ENCOUNTER — Encounter: Payer: Self-pay | Admitting: Cardiovascular Disease

## 2023-07-01 ENCOUNTER — Ambulatory Visit
Payer: No Typology Code available for payment source | Attending: Cardiovascular Disease | Admitting: Cardiovascular Disease

## 2023-07-01 VITALS — BP 140/80 | HR 61 | Ht 69.0 in | Wt 211.0 lb

## 2023-07-01 DIAGNOSIS — R42 Dizziness and giddiness: Secondary | ICD-10-CM | POA: Diagnosis not present

## 2023-07-01 DIAGNOSIS — I700 Atherosclerosis of aorta: Secondary | ICD-10-CM | POA: Diagnosis not present

## 2023-07-01 DIAGNOSIS — E782 Mixed hyperlipidemia: Secondary | ICD-10-CM

## 2023-07-01 DIAGNOSIS — I10 Essential (primary) hypertension: Secondary | ICD-10-CM | POA: Diagnosis not present

## 2023-07-01 NOTE — Patient Instructions (Signed)
Monitor blood pressure at home   Medication Instructions:  No changes  If you need a refill on your cardiac medications before your next appointment, please call your pharmacy.   Lab work: No new labs needed  Testing/Procedures: No new testing needed  Follow-Up: At [facility], you and your health needs are our priority.  As part of our continuing mission to provide you with exceptional heart care, we have created designated Provider Care Teams.  These Care Teams include your primary Cardiologist (physician) and Advanced Practice Providers (APPs -  Physician Assistants and Nurse Practitioners) who all work together to provide you with the care you need, when you need it.  You will need a follow up appointment in 12 months  Providers on your designated Care Team:   Murray Hodgkins, NP Christell Faith, PA-C Cadence Kathlen Mody, [place]  COVID-19 Vaccine Information can be found at: [email or web address] For questions related to vaccine distribution or appointments, please email [email or web address] or call [phone].

## 2023-09-03 ENCOUNTER — Other Ambulatory Visit: Payer: Self-pay | Admitting: Internal Medicine

## 2023-09-03 DIAGNOSIS — I10 Essential (primary) hypertension: Secondary | ICD-10-CM

## 2023-11-26 DIAGNOSIS — Z6830 Body mass index (BMI) 30.0-30.9, adult: Secondary | ICD-10-CM | POA: Diagnosis not present

## 2023-11-26 DIAGNOSIS — R7303 Prediabetes: Secondary | ICD-10-CM | POA: Diagnosis not present

## 2023-11-26 DIAGNOSIS — E669 Obesity, unspecified: Secondary | ICD-10-CM | POA: Diagnosis not present

## 2023-11-26 DIAGNOSIS — I10 Essential (primary) hypertension: Secondary | ICD-10-CM | POA: Diagnosis not present

## 2023-11-26 DIAGNOSIS — E785 Hyperlipidemia, unspecified: Secondary | ICD-10-CM | POA: Diagnosis not present

## 2023-11-26 DIAGNOSIS — Z008 Encounter for other general examination: Secondary | ICD-10-CM | POA: Diagnosis not present

## 2024-01-09 ENCOUNTER — Ambulatory Visit: Admitting: Internal Medicine

## 2024-01-09 ENCOUNTER — Encounter: Payer: Self-pay | Admitting: Internal Medicine

## 2024-01-09 VITALS — BP 118/80 | HR 73 | Ht 69.0 in | Wt 209.8 lb

## 2024-01-09 DIAGNOSIS — Z79899 Other long term (current) drug therapy: Secondary | ICD-10-CM

## 2024-01-09 DIAGNOSIS — D582 Other hemoglobinopathies: Secondary | ICD-10-CM

## 2024-01-09 DIAGNOSIS — I77810 Thoracic aortic ectasia: Secondary | ICD-10-CM | POA: Diagnosis not present

## 2024-01-09 DIAGNOSIS — E1159 Type 2 diabetes mellitus with other circulatory complications: Secondary | ICD-10-CM | POA: Diagnosis not present

## 2024-01-09 DIAGNOSIS — I10 Essential (primary) hypertension: Secondary | ICD-10-CM | POA: Diagnosis not present

## 2024-01-09 DIAGNOSIS — R7303 Prediabetes: Secondary | ICD-10-CM

## 2024-01-09 DIAGNOSIS — Z7984 Long term (current) use of oral hypoglycemic drugs: Secondary | ICD-10-CM | POA: Diagnosis not present

## 2024-01-09 DIAGNOSIS — E782 Mixed hyperlipidemia: Secondary | ICD-10-CM | POA: Diagnosis not present

## 2024-01-09 DIAGNOSIS — I152 Hypertension secondary to endocrine disorders: Secondary | ICD-10-CM | POA: Diagnosis not present

## 2024-01-09 LAB — COMPREHENSIVE METABOLIC PANEL WITH GFR
ALT: 28 U/L (ref 0–53)
AST: 21 U/L (ref 0–37)
Albumin: 4.7 g/dL (ref 3.5–5.2)
Alkaline Phosphatase: 72 U/L (ref 39–117)
BUN: 22 mg/dL (ref 6–23)
CO2: 29 meq/L (ref 19–32)
Calcium: 9.7 mg/dL (ref 8.4–10.5)
Chloride: 101 meq/L (ref 96–112)
Creatinine, Ser: 1.01 mg/dL (ref 0.40–1.50)
GFR: 77.71 mL/min (ref >60.00)
Glucose, Bld: 94 mg/dL (ref 70–99)
Potassium: 4.9 meq/L (ref 3.5–5.1)
Sodium: 137 meq/L (ref 135–145)
Total Bilirubin: 0.7 mg/dL (ref 0.2–1.2)
Total Protein: 7.4 g/dL (ref 6.0–8.3)

## 2024-01-09 LAB — LIPID PANEL
Cholesterol: 200 mg/dL (ref 0–200)
HDL: 49.3 mg/dL (ref >39.00)
LDL Cholesterol: 127 mg/dL — ABNORMAL HIGH (ref 0–99)
NonHDL: 150.34
Total CHOL/HDL Ratio: 4
Triglycerides: 119 mg/dL (ref 0.0–149.0)
VLDL: 23.8 mg/dL (ref 0.0–40.0)

## 2024-01-09 LAB — CBC WITH DIFFERENTIAL/PLATELET
Basophils Absolute: 0 10*3/uL (ref 0.0–0.1)
Basophils Relative: 0.7 % (ref 0.0–3.0)
Eosinophils Absolute: 0.2 10*3/uL (ref 0.0–0.7)
Eosinophils Relative: 4.3 % (ref 0.0–5.0)
HCT: 49.8 % (ref 39.0–52.0)
Hemoglobin: 16.5 g/dL (ref 13.0–17.0)
Lymphocytes Relative: 29 % (ref 12.0–46.0)
Lymphs Abs: 1.6 10*3/uL (ref 0.7–4.0)
MCHC: 33.2 g/dL (ref 30.0–36.0)
MCV: 90.7 fl (ref 78.0–100.0)
Monocytes Absolute: 0.5 10*3/uL (ref 0.1–1.0)
Monocytes Relative: 9.5 % (ref 3.0–12.0)
Neutro Abs: 3.1 10*3/uL (ref 1.4–7.7)
Neutrophils Relative %: 56.5 % (ref 43.0–77.0)
Platelets: 258 10*3/uL (ref 150.0–400.0)
RBC: 5.49 Mil/uL (ref 4.22–5.81)
RDW: 13.2 % (ref 11.5–15.5)
WBC: 5.5 10*3/uL (ref 4.0–10.5)

## 2024-01-09 LAB — MICROALBUMIN / CREATININE URINE RATIO
Creatinine,U: 136.6 mg/dL
Microalb Creat Ratio: 68.9 mg/g — ABNORMAL HIGH (ref 0.0–30.0)
Microalb, Ur: 9.4 mg/dL — ABNORMAL HIGH (ref 0.0–1.9)

## 2024-01-09 LAB — LDL CHOLESTEROL, DIRECT: Direct LDL: 134 mg/dL

## 2024-01-09 LAB — HEMOGLOBIN A1C: Hgb A1c MFr Bld: 6.5 % (ref 4.6–6.5)

## 2024-01-09 MED ORDER — LOSARTAN POTASSIUM 50 MG PO TABS: 100.0000 mg | ORAL_TABLET | Freq: Every day | ORAL | 1 refills | Status: DC

## 2024-01-09 MED ORDER — ROSUVASTATIN CALCIUM 10 MG PO TABS: 10.0000 mg | ORAL_TABLET | ORAL | 1 refills | Status: DC

## 2024-01-09 MED ORDER — AMLODIPINE BESYLATE 2.5 MG PO TABS
2.5000 mg | ORAL_TABLET | Freq: Every day | ORAL | 1 refills | Status: DC
Start: 2024-01-09 — End: 2024-04-17

## 2024-01-09 NOTE — Progress Notes (Unsigned)
 Subjective:  Patient ID: Marc Myers, male    DOB: 04-Jul-1956  Age: 68 y.o. MRN: [id number]  CC: The primary encounter diagnosis was Mixed hyperlipidemia. Diagnoses of Hypertension, unspecified type, Prediabetes, Long-term use of high-risk medication, Elevated hemoglobin (HCC), Ectatic thoracic aorta (HCC), Elevated blood pressure reading in office with white coat syndrome, with diagnosis of hypertension, and Hypertension associated with diabetes (HCC) were also pertinent to this visit.   HPI Marc Myers presents for  Chief Complaint  Patient presents with   Medical Management of Chronic Issues   1) dizziness:   reviewed prior workup  including ER visit in May 2024 ,  ZIO monitor and cardiology evaluation.  Feels he is still recovering from "something" because he felt  like he has had a "cold in his chest".   Patient is taking losartan  50 mg  as prescribed and notes no adverse effects.  Home BP readings have been done about once per week and are  generally  130/80  or higher .  He is avoiding added salt in his diet and has returned to walking regularly  for exercise  .   2) tortuous aorta and ectacic aorta : noted on 2024 chest  xray dueing ER admission for hypertensive urgency ;   prior workup  3)  chronic insomnia ;  frequent wakenings by new puppy .  Takes a 15-30 mintue nap during the day  4) Prediabetes : reviewed diet, prior A1c's and fastin blood sugar   Outpatient Medications Prior to Visit  Medication Sig Dispense Refill   ezetimibe  (ZETIA ) 10 MG tablet Take 1 tablet (10 mg total) by mouth daily. 90 tablet 3   losartan  (COZAAR ) 50 MG tablet TAKE TWO TABLETS BY MOUTH AT BEDTIME 180 tablet 1   rosuvastatin  (CRESTOR ) 10 MG tablet TAKE 1 TABLET BY MOUTH EVERY OTHER DAY 45 tablet 1   No facility-administered medications prior to visit.    Review of Systems;  Patient denies headache, fevers, malaise, unintentional weight loss, skin rash, eye pain, sinus congestion and  sinus pain, sore throat, dysphagia,  hemoptysis , cough, dyspnea, wheezing, chest pain, palpitations, orthopnea, edema, abdominal pain, nausea, melena, diarrhea, constipation, flank pain, dysuria, hematuria, urinary  Frequency, nocturia, numbness, tingling, seizures,  Focal weakness, Loss of consciousness,  Tremor, insomnia, depression, anxiety, and suicidal ideation.      Objective:  BP 118/80   Pulse 73   Ht 5\' 9"  (1.753 m)   Wt 209 lb 12.8 oz (95.2 kg)   SpO2 98%   BMI 30.98 kg/m   BP Readings from Last 3 Encounters:  01/09/24 118/80  07/01/23 (!) 140/80  03/13/23 (!) 150/88    Wt Readings from Last 3 Encounters:  01/09/24 209 lb 12.8 oz (95.2 kg)  07/01/23 211 lb (95.7 kg)  03/13/23 212 lb (96.2 kg)    Physical Exam Vitals reviewed.  Constitutional:      General: He is not in acute distress.    Appearance: Normal appearance. He is normal weight. He is not ill-appearing, toxic-appearing or diaphoretic.  HENT:     Head: Normocephalic and atraumatic.     Right Ear: Tympanic membrane, ear canal and external ear normal. There is no impacted cerumen.     Left Ear: Tympanic membrane, ear canal and external ear normal. There is no impacted cerumen.     Nose: Nose normal.     Mouth/Throat:     Mouth: Mucous membranes are moist.     Pharynx: Oropharynx is clear.  Eyes:     General: No scleral icterus.       Right eye: No discharge.        Left eye: No discharge.     Conjunctiva/sclera: Conjunctivae normal.  Neck:     Thyroid : No thyromegaly.     Vascular: No carotid bruit or JVD.  Cardiovascular:     Rate and Rhythm: Normal rate and regular rhythm.     Heart sounds: Normal heart sounds.  Pulmonary:     Effort: Pulmonary effort is normal. No respiratory distress.     Breath sounds: Normal breath sounds.  Abdominal:     General: Bowel sounds are normal.     Palpations: Abdomen is soft. There is no mass.     Tenderness: There is no abdominal tenderness. There is no  guarding or rebound.  Musculoskeletal:        General: Normal range of motion.     Cervical back: Normal range of motion and neck supple.  Lymphadenopathy:     Cervical: No cervical adenopathy.  Skin:    General: Skin is warm and dry.  Neurological:     General: No focal deficit present.     Mental Status: He is alert and oriented to person, place, and time. Mental status is at baseline.  Psychiatric:        Mood and Affect: Mood normal.        Behavior: Behavior normal.        Thought Content: Thought content normal.        Judgment: Judgment normal.    Lab Results  Component Value Date   HGBA1C 6.5 01/09/2024   HGBA1C 6.1 03/08/2022   HGBA1C 6.5 09/08/2021    Lab Results  Component Value Date   CREATININE 1.01 01/09/2024   CREATININE 1.04 03/13/2023   CREATININE 1.12 01/21/2023    Lab Results  Component Value Date   WBC 5.5 01/09/2024   HGB 16.5 01/09/2024   HCT 49.8 01/09/2024   PLT 258.0 01/09/2024   GLUCOSE 94 01/09/2024   CHOL 200 01/09/2024   TRIG 119.0 01/09/2024   HDL 49.30 01/09/2024   LDLDIRECT 134.0 01/09/2024   LDLCALC 127 (H) 01/09/2024   ALT 28 01/09/2024   AST 21 01/09/2024   NA 137 01/09/2024   K 4.9 01/09/2024   CL 101 01/09/2024   CREATININE 1.01 01/09/2024   BUN 22 01/09/2024   CO2 29 01/09/2024   TSH 0.85 03/08/2022   PSA 1.75 03/13/2023   HGBA1C 6.5 01/09/2024   MICROALBUR 9.4 (H) 01/09/2024    CT HEAD WO CONTRAST ( ) Result Date: 01/21/2023 CLINICAL DATA:  Syncope/presyncope, dizziness EXAM: CT HEAD WITHOUT CONTRAST TECHNIQUE: Contiguous axial images were obtained from the base of the skull through the vertex without intravenous contrast. RADIATION DOSE REDUCTION: This exam was performed according to the departmental dose-optimization program which includes automated exposure control, adjustment of the mA and/or kV according to patient size and/or use of iterative reconstruction technique. COMPARISON:  None Available. FINDINGS:  Brain: No acute intracranial findings are seen. There are no signs of bleeding within the cranium. There is no focal edema or mass effect. Ventricles are not dilated. Cortical sulci are prominent. Vascular: Unremarkable. Skull: No acute findings are seen. Sinuses/Orbits: There is mild mucosal thickening in the ethmoid sinus. Other: None. IMPRESSION: No acute intracranial findings are seen in noncontrast CT brain. Electronically Signed   By: Craven Do M.D.   On: 01/21/2023 09:20   DG Chest 2 View Result  Date: 01/21/2023 CLINICAL DATA:  Shortness of breath EXAM: CHEST - 2 VIEW COMPARISON:  None Available. FINDINGS: Cardiac size is within normal limits. Thoracic aorta is tortuous and ectatic. The lung fields are clear of any infiltrates or pulmonary edema. There is no pleural effusion or pneumothorax. IMPRESSION: No active cardiopulmonary disease. Electronically Signed   By: Craven Do M.D.   On: 01/21/2023 08:20    Assessment & Plan:  .Mixed hyperlipidemia -     Lipid panel -     LDL cholesterol, direct  Hypertension, unspecified type -     Losartan  Potassium; Take 2 tablets (100 mg total) by mouth at bedtime.  Dispense: 180 tablet; Refill: 1 -     Comprehensive metabolic panel with GFR -     Microalbumin / creatinine urine ratio  Prediabetes Assessment & Plan: New onset diabetes . Starting jardiance for microalbunuria  Lab Results  Component Value Date   HGBA1C 6.5 01/09/2024   Lab Results  Component Value Date   MICROALBUR 9.4 (H) 01/09/2024   MICROALBUR 1.8 09/28/2022       Orders: -     Comprehensive metabolic panel with GFR -     Hemoglobin A1c  Long-term use of high-risk medication  Elevated hemoglobin (HCC) -     CBC with Differential/Platelet  Ectatic thoracic aorta [facility]) Assessment & Plan: Noted in 2024 chest x ray during  ER admission for hypertensive urgency .  CT angiogram ordered .  Goal BP 120/70 or less  Orders: -     CT Angio Chest  Pulmonary Embolism (PE) W or WO Contrast; Future  Elevated blood pressure reading in office with white coat syndrome, with diagnosis of hypertension Assessment & Plan: With new onset elevated microalb/cr ratio  despite maximal dose of losartan  at 100 mg dail . Will add Jardiamce for concurrent new onset diabetes    Hypertension associated with diabetes [facility]) Assessment & Plan: New onset diabetes . Starting jardiance for microalbunuria  Lab Results  Component Value Date   HGBA1C 6.5 01/09/2024   Lab Results  Component Value Date   MICROALBUR 9.4 (H) 01/09/2024   MICROALBUR 1.8 09/28/2022        Other orders -     Rosuvastatin  Calcium ; Take 1 tablet (10 mg total) by mouth every other day.  Dispense: 45 tablet; Refill: 1 -     amLODIPine  Besylate; Take 1 tablet (2.5 mg total) by mouth daily.  Dispense: 90 tablet; Refill: 1 -     Empagliflozin; Take 1 tablet (10 mg total) by mouth daily before breakfast.  Dispense: 30 tablet; Refill: 2     I spent 44 minutes on the day of this face to face encounter reviewing patient's  most recent visit with cardiology,,  prior relevant surgical and non surgical procedures, recent  labs and imaging studies, counseling on hypertension management,  reviewing the assessment and plan with patient, and post visit ordering and reviewing of  diagnostics and therapeutics with patient  .   Follow-up: Return in about 3 months (around 04/10/2024) for hypertension.   Thersia Flax, MD

## 2024-01-09 NOTE — Patient Instructions (Addendum)
 I am adding amlodipine  2.5 mg daily to get your blood pressure down to 120/70 .  Tighter control of BP is needed  until we rule out aortic aneurysm ( your aorta  had an abnormal appearance on your May 2024 chest x ray; I have ordered a CT angiogram to evaluate this fully )  continue losartan  100 mg daily   [name]:  Stop slacking off on checking Marc Myers's BP!  (HA HA ). GOAL IS 120/70 OR LESS (if tolerated)   Try listening to TIBETAN BOWLS to help you relax (instead of insomnia meds)

## 2024-01-10 ENCOUNTER — Encounter: Payer: Self-pay | Admitting: Internal Medicine

## 2024-01-10 DIAGNOSIS — I77810 Thoracic aortic ectasia: Secondary | ICD-10-CM | POA: Insufficient documentation

## 2024-01-10 MED ORDER — EMPAGLIFLOZIN 10 MG PO TABS: 10.0000 mg | ORAL_TABLET | Freq: Every day | ORAL | 2 refills | Status: DC

## 2024-01-10 NOTE — Assessment & Plan Note (Signed)
 Noted in 2024 chest x ray during  ER admission for hypertensive urgency .  CT angiogram ordered .  Goal BP 120/70 or less

## 2024-01-10 NOTE — Assessment & Plan Note (Addendum)
 With new onset elevated microalb/cr ratio  despite maximal dose of losartan  at 100 mg dail . Will add Jardiamce for concurrent new onset diabetes

## 2024-01-10 NOTE — Assessment & Plan Note (Addendum)
 New onset diabetes . Starting jardiance for microalbunuria  Lab Results  Component Value Date   HGBA1C 6.5 01/09/2024   Lab Results  Component Value Date   MICROALBUR 9.4 (H) 01/09/2024   MICROALBUR 1.8 09/28/2022

## 2024-01-12 ENCOUNTER — Encounter: Payer: Self-pay | Admitting: Internal Medicine

## 2024-01-12 DIAGNOSIS — E1159 Type 2 diabetes mellitus with other circulatory complications: Secondary | ICD-10-CM

## 2024-01-17 ENCOUNTER — Telehealth: Payer: Self-pay

## 2024-01-17 NOTE — Progress Notes (Signed)
 Care Guide Pharmacy Note  01/17/2024 Name: Marc Myers MRN: [id number] DOB: 04-20-1956  Referred By: Thersia Flax, MD Reason for referral: Complex Care Management (Outreach to schedule with Pharm d )   Marc Myers is a 68 y.o. year old male who is a primary care patient of Thersia Flax, MD.  Marc Myers was referred to the pharmacist for assistance related to: HTN, HLD, and DMII  Successful contact was made with the patient to discuss pharmacy services including being ready for the pharmacist to call at least 5 minutes before the scheduled appointment time and to have medication bottles and any blood pressure readings ready for review. The patient agreed to meet with the pharmacist via telephone visit on (date/time).01/20/2024  Lenton Rail , RMA     [place]  [facility] Guide  Direct Dial: [phone]  Website: [email or web address]

## 2024-01-20 ENCOUNTER — Other Ambulatory Visit: Admitting: Pharmacist

## 2024-01-20 DIAGNOSIS — E1159 Type 2 diabetes mellitus with other circulatory complications: Secondary | ICD-10-CM

## 2024-01-20 DIAGNOSIS — I152 Hypertension secondary to endocrine disorders: Secondary | ICD-10-CM

## 2024-01-20 NOTE — Patient Instructions (Addendum)
 Mr. Marc Myers,   It was a pleasure to speak with you today! As we discussed:?   Your Insurance Benefits: [employer] - Medicare Summary of Benefit: https://[email or web address]-[email or web address] Deductible: $590 (Tiers 3-5 only)  25% of the cost of brand-name medications (Tier 3). Most brand medicines are very expensive, making a 25% payment quite high.   Patient Assistance Programs: We discussed possible savings options may not be useful given they consider your total household income level. For example, most programs have an income limit for 2 people around $60,000 or less.   Possible Alternative:  Feel free to do your research and we can discuss this medicine further to address any questions your or your wife may have.  Brenzavvy (bexagliflozin)  -  A cheaper SGLT2i medication (the same drug class as Jardiance  and [place]). The cost is ~$50/month or ~$46/month if you receive 3 months at a time via [facility].  [place] has been studied for the use in diabetes and is safe and effective in reducing blood sugar compared to placebo in clinica trials. The major difference is that the drug company did not choose to pay for the large clinical trials to established an additional indication specifically for reducing protein in the urine (both Jardiance  and Elvina Hammers have conducted these trials and have shown benefits).   Gl[place]ing is a reasonable alternative if the Jardiance /Farxiga are too costly through your insurance.    Please reach out prior to your next scheduled appointment should you have any questions or concerns.  You may respond directly to this message, or leave me a voicemail at [phone] and I will get back to you shortly.   Thank you!   Future Appointments  Date Time Provider Department Center  01/23/2024  8:00 AM [facility]  04/15/2024  8:30 AM Thersia Flax, MD [facility]     Daron Ellen, PharmD Clinical Pharmacist [facility] [phone]

## 2024-01-20 NOTE — Progress Notes (Signed)
   01/20/2024 Name: Marc Myers MRN: [id number] DOB: [birth year]  Subjective  Chief Complaint  Patient presents with   Medication Access   Care Team: Primary Care Provider: Thersia Flax, MD  Reason for visit: ?  Marc Myers is a 68 y.o. male who presents today for a telephone visit with the pharmacist due to medication access concerns.   Medication Access: ?  Reports that all medications are not affordable.  PCP would like to add SGLT2i though notes that cost is too expensive for patient at this time. ~ $330/month foJardiance  and $140/month for [place]  Prescription drug coverage: YES (Medicare) Payor: [employer] / Plan: [employer] - [place] / Product Type: *No Product type* / .  Summary of Benefit: https://assets.[email or web address]/plan-documents/[email or web address]-[email or web address]--[email or web address] Deductible: $590 (Tiers 3-5 only)   Current Patient Assistance: N/A  Patient lives in a household of 2 though notes income exceeds cutoffs for all PAP programs as his wife still works and makes Government social research officer.   Medicare LIS Eligible: No   Assessment and Plan:   1. Medication Access Brand medications are unaffordable for patient through his Medicare plan (coinsurance requires him to pay 25% of drug cost).  Not eligible for PAP due to high household income.  Not eligible for copay cards due to government insurance.  Brenzavvy (bexagliflozin) is a cheaper SGLT2i therapy (~$50/month or ~$140/56month via [facility]).    2. Microabuminuria: Uncontrolled - UACR 68.9 mg/g (01/21/24). Creatinine on 5/20 was stable at his usual baseline. BUN also WNL. No c/f UTI at time of sample. Worsening glycemic control with second A1c at 6.5% indicating diabetes which may be contributing to worsening of albuminuria.  If unable to obtain Farxiga/Jardiance  d/t cost, Glenda [place]ing would be a reasonable option to consider.   There is limited clinical data specifically comparing Brenzavvy to  other SGLT2i in terms of A1c-lowering efficacy so superiority cannot be determined (though Brenzavvy was effective in reducing A1c compared to placebo in clinica trials). The major difference at this time is lack of established indication for the use of Brenzavvy specifically for proteinuria in diabetes/CKD though many suspect this is a class-effect given nature of mechanism of action. Current Treatment: Losartan  100 mg/d Consider Brenzavvy ([facility] = $50/mo). Patient/wife will do some personal research and reach out with questions/preferences.    Future Appointments  Date Time Provider Department Center  01/23/2024  8:00 AM [facility]  04/15/2024  8:30 AM Thersia Flax, MD [facility]   Daron Ellen, PharmD Clinical Pharmacist [facility] [phone]

## 2024-01-20 NOTE — Telephone Encounter (Signed)
 noted

## 2024-01-23 ENCOUNTER — Ambulatory Visit
Admission: RE | Admit: 2024-01-23 | Discharge: 2024-01-23 | Disposition: A | Discharge status: Home / Self Care | Source: Ambulatory Visit | Attending: Internal Medicine | Admitting: Internal Medicine

## 2024-01-23 DIAGNOSIS — I771 Stricture of artery: Secondary | ICD-10-CM | POA: Diagnosis not present

## 2024-01-23 DIAGNOSIS — I77810 Thoracic aortic ectasia: Secondary | ICD-10-CM | POA: Insufficient documentation

## 2024-01-23 DIAGNOSIS — I517 Cardiomegaly: Secondary | ICD-10-CM | POA: Diagnosis not present

## 2024-01-23 MED ORDER — IOHEXOL 350 MG/ML SOLN
75.0000 mL | Freq: Once | INTRAVENOUS | Status: AC | PRN
  Administered 2024-01-23: 75 mL via INTRAVENOUS

## 2024-01-24 ENCOUNTER — Ambulatory Visit: Payer: Self-pay | Admitting: Internal Medicine

## 2024-01-24 DIAGNOSIS — I77810 Thoracic aortic ectasia: Secondary | ICD-10-CM

## 2024-01-25 NOTE — Assessment & Plan Note (Signed)
 Noted in 2024 chest x ray during  ER admission for hypertensive urgency .  CT angiogram ordered .  Goal BP 120/70 or less

## 2024-03-08 ENCOUNTER — Other Ambulatory Visit: Payer: Self-pay | Admitting: Internal Medicine

## 2024-03-08 DIAGNOSIS — E785 Hyperlipidemia, unspecified: Secondary | ICD-10-CM

## 2024-03-10 ENCOUNTER — Other Ambulatory Visit: Payer: Self-pay | Admitting: Internal Medicine

## 2024-04-01 DIAGNOSIS — H2513 Age-related nuclear cataract, bilateral: Secondary | ICD-10-CM | POA: Diagnosis not present

## 2024-04-09 ENCOUNTER — Other Ambulatory Visit: Payer: Self-pay | Admitting: Internal Medicine

## 2024-04-15 ENCOUNTER — Ambulatory Visit: Admitting: Internal Medicine

## 2024-04-15 ENCOUNTER — Encounter: Payer: Self-pay | Admitting: Internal Medicine

## 2024-04-15 VITALS — BP 111/75 | HR 78 | Ht 69.0 in | Wt 203.8 lb

## 2024-04-15 DIAGNOSIS — I77810 Thoracic aortic ectasia: Secondary | ICD-10-CM

## 2024-04-15 DIAGNOSIS — I152 Hypertension secondary to endocrine disorders: Secondary | ICD-10-CM | POA: Diagnosis not present

## 2024-04-15 DIAGNOSIS — I10 Essential (primary) hypertension: Secondary | ICD-10-CM | POA: Diagnosis not present

## 2024-04-15 DIAGNOSIS — R7303 Prediabetes: Secondary | ICD-10-CM

## 2024-04-15 DIAGNOSIS — E785 Hyperlipidemia, unspecified: Secondary | ICD-10-CM

## 2024-04-15 DIAGNOSIS — E1159 Type 2 diabetes mellitus with other circulatory complications: Secondary | ICD-10-CM

## 2024-04-15 DIAGNOSIS — R0609 Other forms of dyspnea: Secondary | ICD-10-CM | POA: Insufficient documentation

## 2024-04-15 DIAGNOSIS — E782 Mixed hyperlipidemia: Secondary | ICD-10-CM

## 2024-04-15 DIAGNOSIS — R21 Rash and other nonspecific skin eruption: Secondary | ICD-10-CM | POA: Diagnosis not present

## 2024-04-15 DIAGNOSIS — E1129 Type 2 diabetes mellitus with other diabetic kidney complication: Secondary | ICD-10-CM | POA: Insufficient documentation

## 2024-04-15 DIAGNOSIS — T466X5A Adverse effect of antihyperlipidemic and antiarteriosclerotic drugs, initial encounter: Secondary | ICD-10-CM

## 2024-04-15 DIAGNOSIS — R809 Proteinuria, unspecified: Secondary | ICD-10-CM | POA: Diagnosis not present

## 2024-04-15 DIAGNOSIS — Z0001 Encounter for general adult medical examination with abnormal findings: Secondary | ICD-10-CM | POA: Diagnosis not present

## 2024-04-15 DIAGNOSIS — Z125 Encounter for screening for malignant neoplasm of prostate: Secondary | ICD-10-CM | POA: Diagnosis not present

## 2024-04-15 LAB — COMPREHENSIVE METABOLIC PANEL WITH GFR
ALT: 48 U/L (ref 0–53)
AST: 27 U/L (ref 0–37)
Albumin: 4.6 g/dL (ref 3.5–5.2)
Alkaline Phosphatase: 61 U/L (ref 39–117)
BUN: 23 mg/dL (ref 6–23)
CO2: 30 meq/L (ref 19–32)
Calcium: 9.9 mg/dL (ref 8.4–10.5)
Chloride: 99 meq/L (ref 96–112)
Creatinine, Ser: 0.98 mg/dL (ref 0.40–1.50)
GFR: 80.42 mL/min (ref >60.00)
Glucose, Bld: 98 mg/dL (ref 70–99)
Potassium: 4.7 meq/L (ref 3.5–5.1)
Sodium: 136 meq/L (ref 135–145)
Total Bilirubin: 0.8 mg/dL (ref 0.2–1.2)
Total Protein: 7.4 g/dL (ref 6.0–8.3)

## 2024-04-15 LAB — MICROALBUMIN / CREATININE URINE RATIO
Creatinine,U: 121.1 mg/dL
Microalb Creat Ratio: 49.5 mg/g — ABNORMAL HIGH (ref 0.0–30.0)
Microalb, Ur: 6 mg/dL — ABNORMAL HIGH (ref 0.0–1.9)

## 2024-04-15 LAB — HEMOGLOBIN A1C: Hgb A1c MFr Bld: 6.4 % (ref 4.6–6.5)

## 2024-04-15 LAB — LIPID PANEL
Cholesterol: 201 mg/dL — ABNORMAL HIGH (ref 0–200)
HDL: 49.9 mg/dL (ref >39.00)
LDL Cholesterol: 119 mg/dL — ABNORMAL HIGH (ref 0–99)
NonHDL: 150.94
Total CHOL/HDL Ratio: 4
Triglycerides: 162 mg/dL — ABNORMAL HIGH (ref 0.0–149.0)
VLDL: 32.4 mg/dL (ref 0.0–40.0)

## 2024-04-15 LAB — LDL CHOLESTEROL, DIRECT: Direct LDL: 136 mg/dL

## 2024-04-15 LAB — PSA, MEDICARE: PSA: 1.68 ng/mL (ref 0.10–4.00)

## 2024-04-15 MED ORDER — EMPAGLIFLOZIN 10 MG PO TABS: 10.0000 mg | ORAL_TABLET | Freq: Every day | ORAL | 1 refills | Status: DC

## 2024-04-15 MED ORDER — EZETIMIBE 10 MG PO TABS: 10.0000 mg | ORAL_TABLET | Freq: Every day | ORAL | 1 refills | Status: AC

## 2024-04-15 MED ORDER — TRIAMCINOLONE ACETONIDE 0.1 % EX CREA: 1.0000 | TOPICAL_CREAM | Freq: Two times a day (BID) | CUTANEOUS | 0 refills | Status: AC

## 2024-04-15 NOTE — Assessment & Plan Note (Addendum)
 Suggested by  2024 chest x ray during  ER admission for hypertensive urgency .  CT angiogram done May 2025 notes no aneurysm  or dilation.

## 2024-04-15 NOTE — Assessment & Plan Note (Signed)
 He reports decreased endurance since his COVID infection last year.  Given his history of hypertension, suspecte diastolic dysfunction. ECHO ordered

## 2024-04-15 NOTE — Assessment & Plan Note (Signed)
 Continue losartan .  Amlodipine  stopped due to recurrent hypotension

## 2024-04-15 NOTE — Assessment & Plan Note (Signed)
 Home readings have been < 130/80 since stopping amlodipine  due to symptomatic hypotension.  Contiune losartan  only.

## 2024-04-15 NOTE — Addendum Note (Signed)
 Addended by: MARYLYNN VERNEITA CROME on: 04/15/2024 10:06 AM   Modules accepted: Level of Service

## 2024-04-15 NOTE — Progress Notes (Signed)
 Patient ID: Marc Myers, male    DOB: 07-27-1956  Age: 68 y.o. MRN: [id number]  The patient is here for annual preventive examination and management of other chronic and acute problems.   The risk factors are reflected in the social history.   The roster of all physicians providing medical care to patient - is listed in the Snapshot section of the chart.   Activities of daily living:  The patient is 100% independent in all ADLs: dressing, toileting, feeding as well as independent mobility   Home safety : The patient has smoke detectors in the home. They wear seatbelts.  There are no unsecured firearms at home. There is no violence in the home.    There is no risks for hepatitis, STDs or HIV. There is no   history of blood transfusion. They have no travel history to infectious disease endemic areas of the world.   The patient has seen their dentist in the last six month. They have seen their eye doctor in the last year. The patinet  denies slight hearing difficulty with regard to whispered voices and some television programs.  They have deferred audiologic testing in the last year.  They do not  have excessive sun exposure. Discussed the need for sun protection: hats, long sleeves and use of sunscreen if there is significant sun exposure.    Diet: the importance of a healthy diet is discussed. They do have a healthy diet.   The benefits of regular aerobic exercise were discussed. The patient  exercises  3 to 5 days per week  for  60 minutes.    Depression screen: there are no signs or vegative symptoms of depression- irritability, change in appetite, anhedonia, sadness/tearfullness.   The following portions of the patient's history were reviewed and updated as appropriate: allergies, current medications, past family history, past medical history,  past surgical history, past social history  and problem list.   Visual acuity was not assessed per patient preference since the patient has  regular follow up with an  ophthalmologist. Hearing and body mass index were assessed and reviewed.    During the course of the visit the patient was educated and counseled about appropriate screening and preventive services including : fall prevention , diabetes screening, nutrition counseling, colorectal cancer screening, and recommended immunizations.    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   Denies any recent hypoglyemic events.  Taking  Jardiance  10 mg daily,  crestor /zetia ,  and losartan   as directed. Following a carbohydrate modified diet 6 days per week. Denies numbness, burning and tingling of extremities. Appetite is good.  Weight is down 6 lbs insce May      3) HTN: Hypertension: patient checks blood pressure twice weekly at home.  Readings were dropping too low and he had a presyncopal episode last week when  BP was  85/73  so he  stopped the amlodipine ,  and since stopping amlodipine  have been for the most part <130/80 at rest . Patient is following a reduced salt diet most days and is taking medications as prescribed   4) HE CONTINUES TO FEEL deconditioned since his episode 9 months ago,  although his endurance is improving has not returned to the gym no prior stress test or ECHO  4) recurrent papular rash  for the past 4  years  on forearms . Starts as itchy papules that drain clear liquid when scratched   Review of Symptoms  Patient denies headache, fevers, malaise, unintentional weight loss, skin rash, eye pain, sinus congestion and sinus pain, sore throat, dysphagia,  hemoptysis , cough, dyspnea, wheezing, chest pain, palpitations, orthopnea, edema, abdominal pain, nausea, melena, diarrhea, constipation, flank pain, dysuria, hematuria, urinary  Frequency, nocturia, numbness, tingling, seizures,  Focal weakness, Loss of consciousness,   Tremor, insomnia, depression, anxiety, and suicidal ideation.    Physical Exam:  BP 111/75 (Cuff Size: Normal)   Pulse 78   Ht 5' 9 (1.753 m)   Wt 203 lb 12.8 oz (92.4 kg)   SpO2 96%   BMI 30.10 kg/m    Physical Exam Vitals reviewed.  Constitutional:      General: He is not in acute distress.    Appearance: Normal appearance. He is normal weight. He is not ill-appearing, toxic-appearing or diaphoretic.  HENT:     Head: Normocephalic and atraumatic.     Right Ear: Tympanic membrane, ear canal and external ear normal. There is no impacted cerumen.     Left Ear: Tympanic membrane, ear canal and external ear normal. There is no impacted cerumen.     Nose: Nose normal.     Mouth/Throat:     Mouth: Mucous membranes are moist.     Pharynx: Oropharynx is clear.  Eyes:     General: No scleral icterus.       Right eye: No discharge.        Left eye: No discharge.     Conjunctiva/sclera: Conjunctivae normal.  Neck:     Thyroid : No thyromegaly.     Vascular: No carotid bruit or JVD.  Cardiovascular:     Rate and Rhythm: Normal rate and regular rhythm.     Heart sounds: Normal heart sounds.  Pulmonary:     Effort: Pulmonary effort is normal. No respiratory distress.     Breath sounds: Normal breath sounds.  Abdominal:     General: Bowel sounds are normal.     Palpations: Abdomen is soft. There is no mass.     Tenderness: There is no abdominal tenderness. There is no guarding or rebound.  Musculoskeletal:        General: Normal range of motion.     Cervical back: Normal range of motion and neck supple.  Lymphadenopathy:     Cervical: No cervical adenopathy.  Skin:    General: Skin is warm and dry.  Neurological:     General: No focal deficit present.     Mental Status: He is alert and oriented to person, place, and time. Mental status is at baseline.  Psychiatric:        Mood and Affect: Mood normal.        Behavior: Behavior normal.        Thought Content: Thought content  normal.        Judgment: Judgment normal.     Assessment and Plan: Hypertension associated with diabetes (HCC) Assessment & Plan: Continue losartan .  Amlodipine  stopped due to recurrent hypotension   Orders: -     Comprehensive metabolic panel with GFR  Hyperlipidemia, unspecified hyperlipidemia type -     Ezetimibe ; Take 1 tablet (10 mg total) by mouth daily.  Dispense: 90 tablet; Refill: 1  Mixed hyperlipidemia -     Lipid panel -     LDL cholesterol, direct  Prediabetes -     Comprehensive metabolic panel with  GFR -     Hemoglobin A1c  Prostate cancer screening -     PSA, Medicare  Microalbuminuria due to type 2 diabetes mellitus (HCC) Assessment & Plan: He is tolerating jardiance  except for the OOP cost of @200 /month.  Will repeat Uacr  today and consider stopping SGLT 2 inhibitor and continuing ARB for  UaCr > 30 .   Orders: -     Microalbumin / creatinine urine ratio  Encounter for preventative adult health care exam with abnormal findings Assessment & Plan: age appropriate education and counseling updated, referrals for preventative services and immunizations addressed, dietary and smoking counseling addressed, most recent labs reviewed.  I have personally reviewed and have noted:   1) the patient's medical and social history 2) The pt's use of alcohol, tobacco, and illicit drugs 3) The patient's current medications and supplements 4) Functional ability including ADL's, fall risk, home safety risk, hearing and visual impairment 5) Diet and physical activities 6) Evidence for depression or mood disorder 7) The patient's height, weight, and BMI have been recorded in the chartrhythm.     I have made referrals, and provided counseling and education based on review of the above    Dyspnea on exertion Assessment & Plan: He reports decreased endurance since his COVID infection last year.  Given his history of hypertension, suspecte diastolic dysfunction. ECHO  ordered   Orders: -     ECHOCARDIOGRAM COMPLETE; Future  Myalgia due to statin Assessment & Plan: He is tolerating every other day rosuvastatin     Elevated blood pressure reading in office with white coat syndrome, with diagnosis of hypertension Assessment & Plan: Home readings have been < 130/80 since stopping amlodipine  due to symptomatic hypotension.  Contiune losartan  only.    Papular rash, localized Assessment & Plan: Pruritic papular rash affecting both forearms.  Present for nearly 4 years.  Skin is excessively dry, doe snot have the appearance of skin cancer.  Trial of triamcinolone  cream and moisturizer.    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  CT angiogram done May 2025 notes no aneurysm  or dilation.     Other orders -     Triamcinolone  Acetonide; Apply 1 Application topically 2 (two) times daily. To rash  Dispense: 30 g; Refill: 0    Return in about 6 months (around 10/16/2024) for follow up diabetes.  Verneita LITTIE Kettering, MD

## 2024-04-15 NOTE — Assessment & Plan Note (Signed)
 Pruritic papular rash affecting both forearms.  Present for nearly 4 years.  Skin is excessively dry, doe snot have the appearance of skin cancer.  Trial of triamcinolone  cream and moisturizer.

## 2024-04-15 NOTE — Patient Instructions (Addendum)
  The new goals for optimal blood pressure management are 120/70 to < 130/80.  Please continue to check your blood pressure once a week at home and send me the readings so I can determine if you need a change in medication  .  DO NOT RESUME AMLODIPINE    I am repeating your urine test for protein to see if we can stop the JARDIANCE .(The $200 drug)   Do not refill it at this time   I would like to have you scheduled for an echocardiogram .  This is an ultrasound of the heart  that can evaluated  examine how well it contracts and how easily the blood is flowing through each of the valves.

## 2024-04-15 NOTE — Assessment & Plan Note (Addendum)
 He is tolerating jardiance  except for the OOP cost of @200 /month.  Will repeat Uacr  today and consider stopping SGLT 2 inhibitor and continuing ARB for  UaCr > 30 .

## 2024-04-15 NOTE — Assessment & Plan Note (Signed)
 He is tolerating every other day rosuvastatin 

## 2024-04-15 NOTE — Assessment & Plan Note (Signed)

## 2024-04-17 ENCOUNTER — Ambulatory Visit: Payer: Self-pay | Admitting: Internal Medicine

## 2024-04-19 ENCOUNTER — Encounter: Payer: Self-pay | Admitting: Internal Medicine

## 2024-04-20 NOTE — Telephone Encounter (Signed)
 Statin is listed as an allergy. It states that it causes knee pain and made his chest feel funny.

## 2024-04-21 ENCOUNTER — Encounter: Payer: Self-pay | Admitting: Internal Medicine

## 2024-04-21 NOTE — Telephone Encounter (Signed)
 Pt has seen and read mychart message.

## 2024-04-23 ENCOUNTER — Encounter: Payer: Self-pay | Admitting: Internal Medicine

## 2024-05-06 ENCOUNTER — Ambulatory Visit: Attending: Internal Medicine

## 2024-05-06 DIAGNOSIS — R0609 Other forms of dyspnea: Secondary | ICD-10-CM | POA: Diagnosis not present

## 2024-05-06 LAB — ECHOCARDIOGRAM COMPLETE
AR max vel: 2.53 cm2
AV Area VTI: 2.42 cm2
AV Area mean vel: 2.32 cm2
AV Mean grad: 6 mmHg
AV Peak grad: 10.8 mmHg
Ao pk vel: 1.64 m/s
Area-P 1/2: 3.72 cm2

## 2024-05-07 ENCOUNTER — Encounter: Payer: Self-pay | Admitting: Internal Medicine

## 2024-05-09 ENCOUNTER — Encounter: Payer: Self-pay | Admitting: Internal Medicine

## 2024-05-11 ENCOUNTER — Encounter: Payer: Self-pay | Admitting: Cardiovascular Disease

## 2024-05-11 NOTE — Telephone Encounter (Signed)
 See other MyChart encounter in patient's chart for an update.

## 2024-05-12 ENCOUNTER — Ambulatory Visit: Attending: Student | Admitting: Student

## 2024-05-12 ENCOUNTER — Encounter: Payer: Self-pay | Admitting: Student

## 2024-05-12 VITALS — BP 130/78 | HR 72 | Resp 18 | Ht 69.0 in | Wt 209.0 lb

## 2024-05-12 DIAGNOSIS — I10 Essential (primary) hypertension: Secondary | ICD-10-CM

## 2024-05-12 DIAGNOSIS — R002 Palpitations: Secondary | ICD-10-CM

## 2024-05-12 DIAGNOSIS — R0609 Other forms of dyspnea: Secondary | ICD-10-CM | POA: Diagnosis not present

## 2024-05-12 DIAGNOSIS — E7800 Pure hypercholesterolemia, unspecified: Secondary | ICD-10-CM | POA: Diagnosis not present

## 2024-05-12 DIAGNOSIS — Z01810 Encounter for preprocedural cardiovascular examination: Secondary | ICD-10-CM

## 2024-05-12 DIAGNOSIS — I471 Supraventricular tachycardia, unspecified: Secondary | ICD-10-CM | POA: Diagnosis not present

## 2024-05-12 DIAGNOSIS — I2089 Other forms of angina pectoris: Secondary | ICD-10-CM | POA: Diagnosis not present

## 2024-05-12 MED ORDER — ISOSORBIDE MONONITRATE ER 30 MG PO TB24
15.0000 mg | ORAL_TABLET | Freq: Every day | ORAL | 3 refills | Status: DC
Start: 2024-05-12 — End: 2024-06-02

## 2024-05-12 MED ORDER — METOPROLOL TARTRATE 100 MG PO TABS: ORAL_TABLET | ORAL | 0 refills | Status: DC

## 2024-05-12 NOTE — Patient Instructions (Signed)
 Medication Instructions:  Your physician recommends the following medication changes.   START TAKING: Isosorbide  Mononitrate (Imdur ) 15 mg (1/2 of 30 mg tablet) once daily Metoprolol  100 mg once, 2 hours prior to your CTA.  *If you need a refill on your cardiac medications before your next appointment, please call your pharmacy*  Lab Work: Your provider would like for you to have following labs drawn today [facility].   If you have labs (blood work) drawn today and your tests are completely normal, you will receive your results only by: MyChart Message (if you have MyChart) OR A paper copy in the mail If you have any lab test that is abnormal or we need to change your treatment, we will call you to review the results.  Testing/Procedures: Your cardiac CT will be scheduled at :   [facility] [address] [place], [place] [address] [phone]  When scheduled at  [facility], please arrive 15 mins early for check-in and test prep.  Please follow these instructions carefully (unless otherwise directed):  An IV will be required for this test and Nitroglycerin  will be given.  Hold all erectile dysfunction medications at least 3 days (72 hrs) prior to test. (Ie viagra, cialis, sildenafil, tadalafil, etc)   On the Night Before the Test: Be sure to Drink plenty of water. Do not consume any caffeinated/decaffeinated beverages or chocolate 12 hours prior to your test. Do not take any antihistamines 12 hours prior to your test.   On the Day of the Test: Drink plenty of water until 1 hour prior to the test. Do not eat any food 1 hour prior to test. You may take your regular medications prior to the test.  Take metoprolol  (Lopressor ) two hours prior to test. If you take Furosemide/Hydrochlorothiazide/Spironolactone/Chlorthalidone, please HOLD on the morning of the test. Patients who wear a continuous glucose monitor MUST remove the device  prior to scanning. FEMALES- please wear underwire-free bra if available, avoid dresses & tight clothing       After the Test: Drink plenty of water. After receiving IV contrast, you may experience a mild flushed feeling. This is normal. On occasion, you may experience a mild rash up to 24 hours after the test. This is not dangerous. If this occurs, you can take Benadryl 25 mg, Zyrtec, Claritin, or Allegra and increase your fluid intake. (Patients taking Tikosyn should avoid Benadryl, and may take Zyrtec, Claritin, or Allegra) If you experience trouble breathing, this can be serious. If it is severe call 911 IMMEDIATELY. If it is mild, please call our office.  We will call to schedule your test 2-4 weeks out understanding that some insurance companies will need an authorization prior to the service being performed.   For more information and frequently asked questions, please visit our website : [email or web address]  For non-scheduling related questions, please contact the cardiac imaging nurse navigator should you have any questions/concerns: Cardiac Imaging Nurse Navigators Direct Office Dial: [phone]   For scheduling needs, including cancellations and rescheduling, please call [place], [phone].    Follow-Up: At [facility], you and your health needs are our priority.  As part of our continuing mission to provide you with exceptional heart care, our providers are all part of one team.  This team includes your primary Cardiologist (physician) and Advanced Practice Providers or APPs (Physician Assistants and Nurse Practitioners) who all work together to provide you with the care you need, when you need it.  Your  next appointment:   4 - 6 week(s)  Provider:   Evalene Lunger, MD or Barnie Hila, NP    We recommend signing up for the patient portal called MyChart.  Sign up information is provided on this After Visit Summary.  MyChart is used to  connect with patients for Virtual Visits (Telemedicine).  Patients are able to view lab/test results, encounter notes, upcoming appointments, etc.  Non-urgent messages can be sent to your provider as well.   To learn more about what you can do with MyChart, go to [email or web address].

## 2024-05-12 NOTE — Progress Notes (Signed)
 Cardiology Clinic Note   Date: 05/12/2024 ID: Marc Myers, DOB 1955/12/26, MRN [id number]  Primary Cardiologist:  Evalene Lunger, MD  Chief Complaint   Marc Myers is a 68 y.o. male who presents to the clinic today for evaluation of dyspnea.   Patient Profile   Marc Myers is followed by Dr. Gollan for the history outlined below.      Past medical history significant for: Dyspnea. Echo 05/06/2024: EF 55 to 60%.  No RWMA.  Grade I DD.  Normal global strain.  Normal RV size/function.  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 Hypertension.  Hyperlipidemia. Lipid panel 04/15/2024: Direct LDL 136, HDL 50, TG 162, total 201. PSVT. 5-day ZIO 04/19/2023: HR 47 to 184 bpm, average 67 bpm.  6 runs of PSVT longest 19 beats.  Occasional PACs 2.3%.  Rare PVCs.  No A-fib/sustained arrhythmias. Prediabetes.  In summary, patient was initially evaluated by Dr. Barbaraann on 01/23/2023 for dizziness.  He had been seen in the ED for dizziness and lightheadedness and found to be severely hypertensive.  Troponins negative x 2.  EKG normal.  He reported being very active going to the gym 5 to 6 days a week to exercise walking 3 miles on an inclined treadmill.  He denied chest pain or difficulty breathing with exertion. The morning he presented to the ED he developed lightheadedness while walking on the treadmill.  He sat down and symptoms improved.  His wife requested that he present to the ED for evaluation.  He denied palpitations during episode of dizziness.  He reported not drinking water or eating breakfast prior to exercise.  Patient establish care with Dr. Gollan on 07/01/2023 for dizziness and PSVT.  He denied further episodes of dizziness.  No medication changes were made.  Patient was seen by PCP for routine visit.  Echo was ordered for dyspnea and demonstrated normal LV function with Grade I DD.     History of Present Illness    Today, patient is accompanied by his wife. He reports progressive  dyspnea over the last year. He reports symptoms started in May 2024 when he was at the gym and had an episode of dizziness. He has not felt right since that time and has not returned to the gym. He states he has not had an episode of dizziness as intense as the one in May 2024 but he has had episodes of feeling fuzzy headed and not his normal self. He reports dyspnea has progressed to the point that minimal activity will bring on symptoms that last a few minutes and resolve with rest. He reports most profound episodes happened recently when his puppy got out of the house and he was unable to chase after her to get her back home. He reports associated chest discomfort with dyspnea. The discomfort is described less like pain and more like an awareness across his chest that only occurs with exertional dyspnea. Patient also endorses increased anxiety since this started. He and and his wife state he is generally a laid back person but he has a sense something is wrong with his heart and it is causing him to feel anxious and panicky. He denies palpitations, lower extremity edema, orthopnea or PND. Discussed results of echo in detail. All questions answered.  He denies family history of heart disease stating my dad is in his 63s and still push mows his yard.    ROS: All other systems reviewed and are otherwise negative except as noted in History of Present Illness.  EKGs/Labs Reviewed    EKG Interpretation Date/Time:  Tuesday May 12 2024 08:19:14 EDT Ventricular Rate:  72 PR Interval:  146 QRS Duration:  80 QT Interval:  390 QTC Calculation: 427 R Axis:   -16  Text Interpretation: Normal sinus Marc Normal ECG When compared with ECG of 01-Jul-2023 10:37, No significant change was found Confirmed by Loistine Sober [phone]) on 05/12/2024 8:27:59 AM   04/15/2024: ALT 48; AST 27; BUN 23; Creatinine, Ser 0.98; Potassium 4.7; Sodium 136   01/09/2024: Hemoglobin 16.5; WBC 5.5    Physical Exam     VS:  BP 130/78 (BP Location: Left Arm, Patient Position: Sitting, Cuff Size: Normal)   Pulse 72   Resp 18   Ht 5' 9 (1.753 m)   Wt 209 lb (94.8 kg)   SpO2 98%   BMI 30.86 kg/m  , BMI Body mass index is 30.86 kg/m.  GEN: Well nourished, well developed, in no acute distress. Neck: No JVD or carotid bruits. Cardiac:  RRR.  No murmur. No rubs or gallops.   Respiratory:  Respirations regular and unlabored. Clear to auscultation without rales, wheezing or rhonchi. GI: Soft, nontender, nondistended. Extremities: Radials/DP/PT 2+ and equal bilaterally. No clubbing or cyanosis. No edema.  Skin: Warm and dry, no rash. Neuro: Strength intact.  Assessment & Plan   Dyspnea/angina Echo September 2025 demonstrated normal LV/RV function, Grade I DD, mild MR/AI.  Patient reports a year long history of progressive dyspnea that began suddenly with an episode of dizziness. He has undergone workup for his symptoms including a negative CTA of the chest to check for PE. He reports dyspnea is worsening to the point that minimal exertion brings on symptoms last last a few minutes and resolve with rest. He has associated chest discomfort described as an awareness across his chest.  - Start isosorbide  15 mg daily.  - BMP today.  - Coronary CTA.  Palpitations/PSVT 5-day ZIO August 2024 demonstrated HR 47 to 184 bpm, average 67 bpm, 6 runs of PSVT longest 19 beats, occasional PACs 2.3%, rare PVCs. Patient denies palpitations. RRR on exam today.  - Continue to monitor.   Hypertension BP today 158/94 on intake and 130/78 on my recheck.  - Continue losartan . - Add isosorbide  as above.   Hyperlipidemia LDL 136 August 2025. - Continue rosuvastatin  and Zetia . - Continue to follow with PCP.  Disposition: Isosorbide  15 mg daily. BMP today. Coronary CTA. Return in 4-6 weeks or sooner as needed.          Signed, Sober HERO. Marc Westman, DNP, NP-C

## 2024-05-13 ENCOUNTER — Ambulatory Visit: Payer: Self-pay | Admitting: Student

## 2024-05-13 LAB — BASIC METABOLIC PANEL WITH GFR
BUN/Creatinine Ratio: 21 (ref 10–24)
BUN: 21 mg/dL (ref 8–27)
CO2: 21 mmol/L (ref 20–29)
Calcium: 9.8 mg/dL (ref 8.6–10.2)
Chloride: 100 mmol/L (ref 96–106)
Creatinine, Ser: 1.02 mg/dL (ref 0.76–1.27)
Glucose: 101 mg/dL — ABNORMAL HIGH (ref 70–99)
Potassium: 4.7 mmol/L (ref 3.5–5.2)
Sodium: 138 mmol/L (ref 134–144)
eGFR: 81 mL/min/1.73 (ref >59)

## 2024-05-14 ENCOUNTER — Ambulatory Visit (HOSPITAL_COMMUNITY)
Admission: RE | Admit: 2024-05-14 | Discharge: 2024-05-14 | Disposition: A | Discharge status: Home / Self Care | Source: Ambulatory Visit | Attending: Internal Medicine

## 2024-05-14 ENCOUNTER — Ambulatory Visit (HOSPITAL_COMMUNITY)
Admission: RE | Admit: 2024-05-14 | Discharge: 2024-05-14 | Disposition: A | Discharge status: Home / Self Care | Source: Ambulatory Visit | Attending: Student | Admitting: Student

## 2024-05-14 ENCOUNTER — Other Ambulatory Visit: Payer: Self-pay | Admitting: Internal Medicine

## 2024-05-14 DIAGNOSIS — R931 Abnormal findings on diagnostic imaging of heart and coronary circulation: Secondary | ICD-10-CM | POA: Insufficient documentation

## 2024-05-14 DIAGNOSIS — I25118 Atherosclerotic heart disease of native coronary artery with other forms of angina pectoris: Secondary | ICD-10-CM | POA: Diagnosis not present

## 2024-05-14 DIAGNOSIS — I2510 Atherosclerotic heart disease of native coronary artery without angina pectoris: Secondary | ICD-10-CM | POA: Diagnosis not present

## 2024-05-14 DIAGNOSIS — I2089 Other forms of angina pectoris: Secondary | ICD-10-CM | POA: Diagnosis present

## 2024-05-14 MED ORDER — NITROGLYCERIN 0.4 MG SL SUBL
0.8000 mg | SUBLINGUAL_TABLET | Freq: Once | SUBLINGUAL | Status: AC
  Administered 2024-05-14: 0.8 mg via SUBLINGUAL

## 2024-05-14 MED ORDER — IOHEXOL 350 MG/ML SOLN
100.0000 mL | Freq: Once | INTRAVENOUS | Status: AC | PRN
  Administered 2024-05-14: 100 mL via INTRAVENOUS

## 2024-05-14 NOTE — Progress Notes (Signed)
 Last read by Lamar Margrette Ship at 3:18PM on 05/14/2024.

## 2024-05-15 ENCOUNTER — Encounter: Payer: Self-pay | Admitting: Student

## 2024-05-15 ENCOUNTER — Ambulatory Visit: Attending: Student | Admitting: Student

## 2024-05-15 ENCOUNTER — Telehealth: Payer: Self-pay | Admitting: Emergency Medicine

## 2024-05-15 VITALS — BP 122/84 | HR 65 | Ht 69.0 in | Wt 208.0 lb

## 2024-05-15 DIAGNOSIS — I471 Supraventricular tachycardia, unspecified: Secondary | ICD-10-CM

## 2024-05-15 DIAGNOSIS — I10 Essential (primary) hypertension: Secondary | ICD-10-CM

## 2024-05-15 DIAGNOSIS — R0609 Other forms of dyspnea: Secondary | ICD-10-CM

## 2024-05-15 DIAGNOSIS — E785 Hyperlipidemia, unspecified: Secondary | ICD-10-CM

## 2024-05-15 DIAGNOSIS — I25118 Atherosclerotic heart disease of native coronary artery with other forms of angina pectoris: Secondary | ICD-10-CM | POA: Diagnosis not present

## 2024-05-15 DIAGNOSIS — R002 Palpitations: Secondary | ICD-10-CM | POA: Diagnosis not present

## 2024-05-15 MED ORDER — ROSUVASTATIN CALCIUM 10 MG PO TABS: 10.0000 mg | ORAL_TABLET | Freq: Every day | ORAL | 3 refills | Status: DC

## 2024-05-15 MED ORDER — ASPIRIN 81 MG PO TBEC: 81.0000 mg | DELAYED_RELEASE_TABLET | Freq: Every day | ORAL | Status: AC

## 2024-05-15 NOTE — Patient Instructions (Signed)
 Medication Instructions:  Your physician recommends the following medication changes.   START TAKING: Aspirin  81 mg once daily  INCREASE: Crestor  to  10 mg once daily  *If you need a refill on your cardiac medications before your next appointment, please call your pharmacy*  Lab Work: None ordered at this time  If you have labs (blood work) drawn today and your tests are completely normal, you will receive your results only by: MyChart Message (if you have MyChart) OR A paper copy in the mail If you have any lab test that is abnormal or we need to change your treatment, we will call you to review the results.  Testing/Procedures:  [facility] [place] [place] [address] [place] [address] Dept: [phone] Loc: [phone]  Marc Myers  05/15/2024  You are scheduled for a Cardiac Catheterization on Tuesday, September 16 with Dr. Lonni End.  1. Please arrive at the [facility] Entrance of [facility], [address], [place] [address] at 7:30 AM (This is 1 hour(s) prior to your procedure time).  Proceed to the Check-In Desk directly inside the entrance.  Procedure Parking: Use the entrance off of the [address] side of the hospital. Turn right upon entering and follow the driveway to parking that is directly in front of the [facility] Center. There is no valet parking available at this entrance, however there is an awning directly in front of the [facility] Center for drop off/ pick up for patients.  Special note: Every effort is made to have your procedure done on time. Please understand that emergencies sometimes delay scheduled procedures.  2. Diet: NPO: Nothing to eat OR drink after midnight. (For TEE and Cath the same day)   3. Hydration: You need to be well hydrated before your procedure. On September 16, you may drink approved liquids  (see below) until 2 hours before the procedure, with 16 oz of water as your last intake.   List of approved liquids water, clear juice, clear tea, black coffee, fruit juices, non-citric and without pulp, carbonated beverages, Gatorade, Kool -Aid, plain Jello-O and plain ice popsicles.    Current Outpatient Medications (Cardiovascular):    amLODipine  (NORVASC ) 2.5 MG tablet, Take 2.5 mg by mouth daily.   ezetimibe  (ZETIA ) 10 MG tablet, Take 1 tablet (10 mg total) by mouth daily.   isosorbide  mononitrate (IMDUR ) 30 MG 24 hr tablet, Take 0.5 tablets (15 mg total) by mouth daily.   losartan  (COZAAR ) 50 MG tablet, Take 2 tablets (100 mg total) by mouth at bedtime.   metoprolol  tartrate (LOPRESSOR ) 100 MG tablet, TAKE 1 TABLET 2 HR PRIOR TO CARDIAC PROCEDURE (Patient not taking: Reported on 05/15/2024)   rosuvastatin  (CRESTOR ) 10 MG tablet, Take 1 tablet (10 mg total) by mouth daily.   Current Outpatient Medications (Analgesics):    aspirin  EC 81 MG tablet, Take 1 tablet (81 mg total) by mouth daily. Swallow whole.   Current Outpatient Medications (Other):    triamcinolone  cream (KENALOG ) 0.1 %, Apply 1 Application topically 2 (two) times daily. To rash (Patient taking differently: Apply 1 Application topically 2 (two) times daily as needed (rash/itching).) *For reference purposes while preparing patient instructions.   Delete this med list prior to printing instructions for patient.*    Stop taking, Cozaar  (Losartan ) Monday, September 15,    On the morning of your procedure, take your Aspirin  81  mg and any morning medicines NOT listed above.  You may use sips of water.  6. Plan to go home the same day, you will only stay overnight if medically necessary. 7. Bring a current list of your medications and current insurance cards. 8. You MUST have a responsible person to drive you home. 9. Someone MUST be with you the first 24 hours after you arrive home or your discharge will be  delayed. 10. Please wear clothes that are easy to get on and off and wear slip-on shoes.  Thank you for allowing us  to care for you!   -- [facility]   Follow-Up: At [facility], you and your health needs are our priority.  As part of our continuing mission to provide you with exceptional heart care, our providers are all part of one team.  This team includes your primary Cardiologist (physician) and Advanced Practice Providers or APPs (Physician Assistants and Nurse Practitioners) who all work together to provide you with the care you need, when you need it.  Your next appointment:   3 week(s)  Provider:   Evalene Lunger, MD or Barnie Hila, NP    We recommend signing up for the patient portal called MyChart.  Sign up information is provided on this After Visit Summary.  MyChart is used to connect with patients for Virtual Visits (Telemedicine).  Patients are able to view lab/test results, encounter notes, upcoming appointments, etc.  Non-urgent messages can be sent to your provider as well.   To learn more about what you can do with MyChart, go to [email or web address].

## 2024-05-15 NOTE — H&P (View-Only) (Signed)
 Cardiology Clinic Note   Date: 05/15/2024 ID: Marc Myers, DOB Mar 26, 1956, MRN [id number]  Primary Cardiologist:  Evalene Lunger, MD  Chief Complaint   Marc Myers is a 68 y.o. male who presents to the clinic today for follow up after coronary CTA.   Patient Profile   Marc Myers is followed by Dr. Gollan for the history outlined below.       Past medical history significant for: Dyspnea. Echo 05/06/2024: EF 55 to 60%.  No RWMA.  Grade I DD.  Normal global strain.  Normal RV size/function.  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 Coronary CTA 05/14/2024: Coronary calcium  score 433 (79th percentile).  Severe mixed plaque stenosis proximal LAD.  Severe mixed plaque stenosis mid LAD.  Mild mixed plaque stenosis proximal D3.  Mild mixed plaque mid LCx.  Moderate soft plaque mid OM1.  Moderate mixed plaque proximal RCA just prior to acute marginal takeoff.  Moderate soft plaque ostial marginal.  Complex soft plaque  occlusion of mid RCA cannot exclude CTO.  FFR analysis shows evidence of significant stenosis in LAD and RCA.  Mid RCA is modeled as a CTO vessel. Recommend cardiac cath.  Ectatic thoracic aorta. Hypertension.  Hyperlipidemia. Lipid panel 04/15/2024: Direct LDL 136, HDL 50, TG 162, total 201. PSVT. 5-day ZIO 04/19/2023: HR 47 to 184 bpm, average 67 bpm.  6 runs of PSVT longest 19 beats.  Occasional PACs 2.3%.  Rare PVCs.  No A-fib/sustained arrhythmias. Prediabetes.  In summary, patient was initially evaluated by Dr. Barbaraann on 01/23/2023 for dizziness.  He had been seen in the ED for dizziness and lightheadedness and found to be severely hypertensive.  Troponins negative x 2.  EKG normal.  He reported being very active going to the gym 5 to 6 days a week to exercise walking 3 miles on an inclined treadmill.  He denied chest pain or difficulty breathing with exertion. The morning he presented to the ED he developed lightheadedness while walking on the treadmill.  He sat down and symptoms improved.  His  wife requested that he present to the ED for evaluation.  He denied palpitations during episode of dizziness.  He reported not drinking water or eating breakfast prior to exercise.  Patient establish care with Dr. Gollan on 07/01/2023 for dizziness and PSVT.  He denied further episodes of dizziness.  No medication changes were made.  Patient was seen by PCP for routine visit.  Echo was ordered for dyspnea and demonstrated normal LV function with Grade I DD.   Patient was last seen in the office by me on 05/12/2024 for evaluation of progressive dyspnea. He denies frank chest pain but felt there were times he had a greater awareness across his chest with the shortness of breath.  Patient reported dyspnea had progressed to the point minimal exertion caused symptoms.He was started on isosorbide  and scheduled for coronary CTA.      History of Present Illness    Today, patient is accompanied by his wife. He continues to experience dyspnea with exertion. He did have increased heart burn one night ago that improved with Tums. Discussed results of coronary CTA in detail. All questions answered. Patient is in agreement with proceeding with LHC.     ROS: All other systems reviewed and are otherwise negative except as noted in History of Present Illness.  EKGs/Labs Reviewed        04/15/2024: ALT 48; AST 27 05/12/2024: BUN 21; Creatinine, Ser 1.02; Potassium 4.7; Sodium 138   01/09/2024: Hemoglobin 16.5; WBC 5.5  Physical Exam    VS:  BP 122/84   Pulse 65   Ht 5' 9 (1.753 m)   Wt 208 lb (94.3 kg)   SpO2 97%   BMI 30.72 kg/m  , BMI Body mass index is 30.72 kg/m.  GEN: Well nourished, well developed, in no acute distress. Neck: No JVD or carotid bruits. Cardiac:  RRR.  No murmur. No rubs or gallops.   Respiratory:  Respirations regular and unlabored. Clear to auscultation without rales, wheezing or rhonchi. GI: Soft, nontender, nondistended. Extremities: Radials/DP/PT 2+ and equal  bilaterally. No clubbing or cyanosis. No edema.  Skin: Warm and dry, no rash. Neuro: Strength intact.  Assessment & Plan   CAD/Dyspnea/angina/positive coronary CTA Echo September 2025 demonstrated normal LV/RV function, Grade I DD, mild MR/AI.  Patient reports a year long history of progressive dyspnea that began suddenly with an episode of dizziness. He has undergone workup for his symptoms including a negative CTA of the chest to check for PE. He reports dyspnea is worsening to the point that minimal exertion brings on symptoms last last a few minutes and resolve with rest. He has associated chest discomfort described as an awareness across his chest. Coronary CTA demonstrated significant stenosis of RCA and LAD. Patient continues to have DOE. He had heart burn one night ago that improved with Tums. He agrees to proceed with LHC. Strict ED precautions given.  - Continue isosorbide  and Zetia .  - Start aspirin  81 mg daily. - Increase rosuvastatin  to daily dosing as below.  - Schedule LHC.    Palpitations/PSVT 5-day ZIO August 2024 demonstrated HR 47 to 184 bpm, average 67 bpm, 6 runs of PSVT longest 19 beats, occasional PACs 2.3%, rare PVCs. Patient denies palpitations. RRR on exam today.  - Continue to monitor.    Hypertension BP today 122/84.  - Continue losartan , isosorbide .   Hyperlipidemia LDL 136 August 2025. Patient has been taking rosuvastatin  every other day. He agrees to try daily dosing. He understands that his dose will likely be increased.  - Continue  Zetia . - Increase rosuvastatin  to 10 mg daily.   Disposition: LHC. Add aspirin  81 mg daily. Return in 3 weeks or sooner as needed.      Informed Consent   Shared Decision Making/Informed Consent The risks [stroke (1 in 1000), death (1 in 1000), kidney failure [usually temporary] (1 in 500), bleeding (1 in 200), allergic reaction [possibly serious] (1 in 200)], benefits (diagnostic support and management of coronary artery  disease) and alternatives of a cardiac catheterization were discussed in detail with Marc Myers and he is willing to proceed.      Signed, Barnie HERO. Anetra Czerwinski, DNP, NP-C

## 2024-05-15 NOTE — Telephone Encounter (Signed)
 Called and spoke with the patient and his wife to schedule them for an appointment today to go over CTA results with Barnie Hila, NP. Patient is scheduled for 3:10 pm today, 05/15/24.  Patient verbalized understanding with all questions and concerns addressed at this time.

## 2024-05-15 NOTE — Progress Notes (Signed)
 Cardiology Clinic Note   Date: 05/15/2024 ID: Marc Myers, DOB Mar 26, 1956, MRN [id number]  Primary Cardiologist:  Evalene Lunger, MD  Chief Complaint   Marc Myers is a 68 y.o. male who presents to the clinic today for follow up after coronary CTA.   Patient Profile   Marc Myers is followed by Dr. Gollan for the history outlined below.       Past medical history significant for: Dyspnea. Echo 05/06/2024: EF 55 to 60%.  No RWMA.  Grade I DD.  Normal global strain.  Normal RV size/function.  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 Coronary CTA 05/14/2024: Coronary calcium  score 433 (79th percentile).  Severe mixed plaque stenosis proximal LAD.  Severe mixed plaque stenosis mid LAD.  Mild mixed plaque stenosis proximal D3.  Mild mixed plaque mid LCx.  Moderate soft plaque mid OM1.  Moderate mixed plaque proximal RCA just prior to acute marginal takeoff.  Moderate soft plaque ostial marginal.  Complex soft plaque  occlusion of mid RCA cannot exclude CTO.  FFR analysis shows evidence of significant stenosis in LAD and RCA.  Mid RCA is modeled as a CTO vessel. Recommend cardiac cath.  Ectatic thoracic aorta. Hypertension.  Hyperlipidemia. Lipid panel 04/15/2024: Direct LDL 136, HDL 50, TG 162, total 201. PSVT. 5-day ZIO 04/19/2023: HR 47 to 184 bpm, average 67 bpm.  6 runs of PSVT longest 19 beats.  Occasional PACs 2.3%.  Rare PVCs.  No A-fib/sustained arrhythmias. Prediabetes.  In summary, patient was initially evaluated by Dr. Barbaraann on 01/23/2023 for dizziness.  He had been seen in the ED for dizziness and lightheadedness and found to be severely hypertensive.  Troponins negative x 2.  EKG normal.  He reported being very active going to the gym 5 to 6 days a week to exercise walking 3 miles on an inclined treadmill.  He denied chest pain or difficulty breathing with exertion. The morning he presented to the ED he developed lightheadedness while walking on the treadmill.  He sat down and symptoms improved.  His  wife requested that he present to the ED for evaluation.  He denied palpitations during episode of dizziness.  He reported not drinking water or eating breakfast prior to exercise.  Patient establish care with Dr. Gollan on 07/01/2023 for dizziness and PSVT.  He denied further episodes of dizziness.  No medication changes were made.  Patient was seen by PCP for routine visit.  Echo was ordered for dyspnea and demonstrated normal LV function with Grade I DD.   Patient was last seen in the office by me on 05/12/2024 for evaluation of progressive dyspnea. He denies frank chest pain but felt there were times he had a greater awareness across his chest with the shortness of breath.  Patient reported dyspnea had progressed to the point minimal exertion caused symptoms.He was started on isosorbide  and scheduled for coronary CTA.      History of Present Illness    Today, patient is accompanied by his wife. He continues to experience dyspnea with exertion. He did have increased heart burn one night ago that improved with Tums. Discussed results of coronary CTA in detail. All questions answered. Patient is in agreement with proceeding with LHC.     ROS: All other systems reviewed and are otherwise negative except as noted in History of Present Illness.  EKGs/Labs Reviewed        04/15/2024: ALT 48; AST 27 05/12/2024: BUN 21; Creatinine, Ser 1.02; Potassium 4.7; Sodium 138   01/09/2024: Hemoglobin 16.5; WBC 5.5  Physical Exam    VS:  BP 122/84   Pulse 65   Ht 5' 9 (1.753 m)   Wt 208 lb (94.3 kg)   SpO2 97%   BMI 30.72 kg/m  , BMI Body mass index is 30.72 kg/m.  GEN: Well nourished, well developed, in no acute distress. Neck: No JVD or carotid bruits. Cardiac:  RRR.  No murmur. No rubs or gallops.   Respiratory:  Respirations regular and unlabored. Clear to auscultation without rales, wheezing or rhonchi. GI: Soft, nontender, nondistended. Extremities: Radials/DP/PT 2+ and equal  bilaterally. No clubbing or cyanosis. No edema.  Skin: Warm and dry, no rash. Neuro: Strength intact.  Assessment & Plan   CAD/Dyspnea/angina/positive coronary CTA Echo September 2025 demonstrated normal LV/RV function, Grade I DD, mild MR/AI.  Patient reports a year long history of progressive dyspnea that began suddenly with an episode of dizziness. He has undergone workup for his symptoms including a negative CTA of the chest to check for PE. He reports dyspnea is worsening to the point that minimal exertion brings on symptoms last last a few minutes and resolve with rest. He has associated chest discomfort described as an awareness across his chest. Coronary CTA demonstrated significant stenosis of RCA and LAD. Patient continues to have DOE. He had heart burn one night ago that improved with Tums. He agrees to proceed with LHC. Strict ED precautions given.  - Continue isosorbide  and Zetia .  - Start aspirin  81 mg daily. - Increase rosuvastatin  to daily dosing as below.  - Schedule LHC.    Palpitations/PSVT 5-day ZIO August 2024 demonstrated HR 47 to 184 bpm, average 67 bpm, 6 runs of PSVT longest 19 beats, occasional PACs 2.3%, rare PVCs. Patient denies palpitations. RRR on exam today.  - Continue to monitor.    Hypertension BP today 122/84.  - Continue losartan , isosorbide .   Hyperlipidemia LDL 136 August 2025. Patient has been taking rosuvastatin  every other day. He agrees to try daily dosing. He understands that his dose will likely be increased.  - Continue  Zetia . - Increase rosuvastatin  to 10 mg daily.   Disposition: LHC. Add aspirin  81 mg daily. Return in 3 weeks or sooner as needed.      Informed Consent   Shared Decision Making/Informed Consent The risks [stroke (1 in 1000), death (1 in 1000), kidney failure [usually temporary] (1 in 500), bleeding (1 in 200), allergic reaction [possibly serious] (1 in 200)], benefits (diagnostic support and management of coronary artery  disease) and alternatives of a cardiac catheterization were discussed in detail with Mr. Vanauken and he is willing to proceed.      Signed, Barnie HERO. Anetra Czerwinski, DNP, NP-C

## 2024-05-19 ENCOUNTER — Encounter: Payer: Self-pay | Admitting: Internal Medicine

## 2024-05-19 ENCOUNTER — Ambulatory Visit
Admission: RE | Admit: 2024-05-19 | Discharge: 2024-05-19 | Disposition: A | Discharge status: Home / Self Care | Attending: Internal Medicine | Admitting: Internal Medicine

## 2024-05-19 ENCOUNTER — Encounter
Admission: RE | Disposition: A | Discharge status: Home / Self Care | Payer: Self-pay | Source: Home / Self Care | Attending: Internal Medicine

## 2024-05-19 ENCOUNTER — Other Ambulatory Visit: Payer: Self-pay

## 2024-05-19 DIAGNOSIS — E785 Hyperlipidemia, unspecified: Secondary | ICD-10-CM | POA: Diagnosis not present

## 2024-05-19 DIAGNOSIS — I471 Supraventricular tachycardia, unspecified: Secondary | ICD-10-CM | POA: Diagnosis not present

## 2024-05-19 DIAGNOSIS — R0609 Other forms of dyspnea: Secondary | ICD-10-CM

## 2024-05-19 DIAGNOSIS — Z7982 Long term (current) use of aspirin: Secondary | ICD-10-CM | POA: Diagnosis not present

## 2024-05-19 DIAGNOSIS — Z79899 Other long term (current) drug therapy: Secondary | ICD-10-CM | POA: Insufficient documentation

## 2024-05-19 DIAGNOSIS — I25118 Atherosclerotic heart disease of native coronary artery with other forms of angina pectoris: Secondary | ICD-10-CM

## 2024-05-19 DIAGNOSIS — R002 Palpitations: Secondary | ICD-10-CM | POA: Insufficient documentation

## 2024-05-19 DIAGNOSIS — R931 Abnormal findings on diagnostic imaging of heart and coronary circulation: Secondary | ICD-10-CM

## 2024-05-19 DIAGNOSIS — I10 Essential (primary) hypertension: Secondary | ICD-10-CM | POA: Diagnosis not present

## 2024-05-19 DIAGNOSIS — I2582 Chronic total occlusion of coronary artery: Secondary | ICD-10-CM | POA: Insufficient documentation

## 2024-05-19 HISTORY — PX: LEFT HEART CATH AND CORONARY ANGIOGRAPHY: CATH118249

## 2024-05-19 LAB — CBC
HCT: 51 % (ref 39.0–52.0)
Hemoglobin: 16.8 g/dL (ref 13.0–17.0)
MCH: 29.5 pg (ref 26.0–34.0)
MCHC: 32.9 g/dL (ref 30.0–36.0)
MCV: 89.5 fL (ref 80.0–100.0)
Platelets: 254 K/uL (ref 150–400)
RBC: 5.7 MIL/uL (ref 4.22–5.81)
RDW: 12.6 % (ref 11.5–15.5)
WBC: 7.2 K/uL (ref 4.0–10.5)
nRBC: 0 % (ref 0.0–0.2)

## 2024-05-19 SURGERY — LEFT HEART CATH AND CORONARY ANGIOGRAPHY
Anesthesia: Moderate Sedation | Laterality: Left

## 2024-05-19 MED ORDER — SODIUM CHLORIDE 0.9% FLUSH: 3.0000 mL | INTRAVENOUS | Status: DC | PRN

## 2024-05-19 MED ORDER — SODIUM CHLORIDE 0.9% FLUSH: 3.0000 mL | Freq: Two times a day (BID) | INTRAVENOUS | Status: DC

## 2024-05-19 MED ORDER — FENTANYL CITRATE (PF) 100 MCG/2ML IJ SOLN
INTRAMUSCULAR | Status: AC
  Filled 2024-05-19: qty 2

## 2024-05-19 MED ORDER — HEPARIN (PORCINE) IN NACL 1000-0.9 UT/500ML-% IV SOLN
INTRAVENOUS | Status: DC | PRN
  Administered 2024-05-19: 1000 mL

## 2024-05-19 MED ORDER — IOHEXOL 300 MG/ML  SOLN
INTRAMUSCULAR | Status: DC | PRN
  Administered 2024-05-19: 45 mL

## 2024-05-19 MED ORDER — FREE WATER: 500.0000 mL | Freq: Once | Status: DC

## 2024-05-19 MED ORDER — VERAPAMIL HCL 2.5 MG/ML IV SOLN
INTRAVENOUS | Status: DC | PRN
  Administered 2024-05-19: 2.5 mg via INTRA_ARTERIAL

## 2024-05-19 MED ORDER — HEPARIN SODIUM (PORCINE) 1000 UNIT/ML IJ SOLN
INTRAMUSCULAR | Status: AC
  Filled 2024-05-19: qty 10

## 2024-05-19 MED ORDER — FENTANYL CITRATE (PF) 100 MCG/2ML IJ SOLN
INTRAMUSCULAR | Status: DC | PRN
  Administered 2024-05-19: 25 ug via INTRAVENOUS

## 2024-05-19 MED ORDER — ASPIRIN 81 MG PO CHEW: 81.0000 mg | CHEWABLE_TABLET | ORAL | Status: DC

## 2024-05-19 MED ORDER — LABETALOL HCL 5 MG/ML IV SOLN: 10.0000 mg | INTRAVENOUS | Status: DC | PRN

## 2024-05-19 MED ORDER — ONDANSETRON HCL 4 MG/2ML IJ SOLN: 4.0000 mg | Freq: Four times a day (QID) | INTRAMUSCULAR | Status: DC | PRN

## 2024-05-19 MED ORDER — ACETAMINOPHEN 325 MG PO TABS: 650.0000 mg | ORAL_TABLET | ORAL | Status: DC | PRN

## 2024-05-19 MED ORDER — LIDOCAINE HCL 1 % IJ SOLN
INTRAMUSCULAR | Status: AC
  Filled 2024-05-19: qty 20

## 2024-05-19 MED ORDER — FREE WATER
500.0000 mL | Freq: Once | Status: DC
Start: 2024-05-19 — End: 2024-05-19

## 2024-05-19 MED ORDER — MIDAZOLAM HCL 2 MG/2ML IJ SOLN
INTRAMUSCULAR | Status: DC | PRN
  Administered 2024-05-19: 2 mg via INTRAVENOUS

## 2024-05-19 MED ORDER — SODIUM CHLORIDE 0.9 % IV SOLN: 250.0000 mL | INTRAVENOUS | Status: DC | PRN

## 2024-05-19 MED ORDER — MIDAZOLAM HCL 2 MG/2ML IJ SOLN
INTRAMUSCULAR | Status: AC
  Filled 2024-05-19: qty 2

## 2024-05-19 MED ORDER — HEPARIN SODIUM (PORCINE) 1000 UNIT/ML IJ SOLN
INTRAMUSCULAR | Status: DC | PRN
  Administered 2024-05-19: 4500 [IU] via INTRAVENOUS

## 2024-05-19 MED ORDER — HYDRALAZINE HCL 20 MG/ML IJ SOLN: 10.0000 mg | INTRAMUSCULAR | Status: DC | PRN

## 2024-05-19 MED ORDER — VERAPAMIL HCL 2.5 MG/ML IV SOLN
INTRAVENOUS | Status: AC
  Filled 2024-05-19: qty 2

## 2024-05-19 MED ORDER — HEPARIN (PORCINE) IN NACL 1000-0.9 UT/500ML-% IV SOLN
INTRAVENOUS | Status: AC
  Filled 2024-05-19: qty 1000

## 2024-05-19 MED ORDER — LIDOCAINE HCL (PF) 1 % IJ SOLN
INTRAMUSCULAR | Status: DC | PRN
  Administered 2024-05-19: 2 mL

## 2024-05-19 SURGICAL SUPPLY — 9 items
CATH 5F 110X4 TIG (CATHETERS) IMPLANT
CATH INFINITI 5 FR JL3.5 (CATHETERS) IMPLANT
DEVICE RAD TR BAND REGULAR (VASCULAR PRODUCTS) IMPLANT
DRAPE BRACHIAL (DRAPES) IMPLANT
GLIDESHEATH SLEND SS 6F .021 (SHEATH) IMPLANT
GUIDEWIRE INQWIRE 1.5J.035X260 (WIRE) IMPLANT
PACK CARDIAC CATH (CUSTOM PROCEDURE TRAY) ×1 IMPLANT
SET ATX-X65L (MISCELLANEOUS) IMPLANT
STATION PROTECTION PRESSURIZED (MISCELLANEOUS) IMPLANT

## 2024-05-19 NOTE — Interval H&P Note (Signed)
 History and Physical Interval Note:  05/19/2024 9:00 AM  [name]  has presented today for surgery, with the diagnosis of dizziness, dyspnea on exertion, and abnormal cardiac CTA.  The various methods of treatment have been discussed with the patient and family. After consideration of risks, benefits and other options for treatment, the patient has consented to  Procedure(s): LEFT HEART CATH AND CORONARY ANGIOGRAPHY (Left) as a surgical intervention.  The patient's history has been reviewed, patient examined, no change in status, stable for surgery.  I have reviewed the patient's chart and labs.  Questions were answered to the patient's satisfaction.    Cath Lab Visit (complete for each Cath Lab visit)  Clinical Evaluation Leading to the Procedure:   ACS: No.  Non-ACS:    Anginal Classification: CCS III  Anti-ischemic medical therapy: Maximal Therapy (2 or more classes of medications)  Non-Invasive Test Results: Probable significant LAD and RCA disease by CT/CT-FFR -> intermediate/high risk  Prior CABG: No previous CABG  Soila Printup

## 2024-05-20 ENCOUNTER — Other Ambulatory Visit: Payer: Self-pay | Admitting: *Deleted

## 2024-05-20 ENCOUNTER — Ambulatory Visit: Attending: Surgery | Admitting: Surgery

## 2024-05-20 ENCOUNTER — Encounter: Payer: Self-pay | Admitting: Surgery

## 2024-05-20 ENCOUNTER — Encounter: Payer: Self-pay | Admitting: *Deleted

## 2024-05-20 VITALS — BP 155/80 | HR 79 | Resp 20 | Ht 69.0 in | Wt 205.0 lb

## 2024-05-20 DIAGNOSIS — I2510 Atherosclerotic heart disease of native coronary artery without angina pectoris: Secondary | ICD-10-CM

## 2024-05-20 NOTE — Progress Notes (Unsigned)
 [address] [place] [address]             [phone]     Cardiothoracic Surgery Consultation   PCP is Marylynn Verneita CROME, MD Referring Provider is Marylynn Verneita CROME, MD  Chief Complaint  Patient presents with   Coronary Artery Disease    Cardiac Cath 05/19/24/ ECHO 05/06/24    HPI:  The patient is a 68 year old gentleman with a history of hypertension and hyperlipidemia who had been very active exercising frequently and was evaluated by cardiology in [place] on 01/23/2023 for dizziness that developed while he was working out walking on a treadmill.  His troponins were negative and ECG was normal.  He had no history of chest discomfort or shortness of breath or fatigue and had been going to the gym 5 to 6 days/week and walking several miles on an incline treadmill.  It was felt that he was likely dehydrated at the time that he developed this dizziness.  He then established care with Dr. Gollan that was seen on 07/01/2023 for evaluation of dizziness.  He had a CTA of the chest on 01/23/2024 which was unremarkable.  A CT of the head showed no acute intracranial findings.  His PCP, Dr. Marylynn, got an echocardiogram on 05/06/2024 which showed a left ventricular ejection fraction of 55 to 60% with grade 1 diastolic dysfunction.  There is mild MR.  There is mild AI with a normal trileaflet aortic valve.  He subsequently had a CT coronary morphology study on 05/14/2024 which showed a coronary calcium  score 433 which was 79th percentile.  There was severe mixed plaque in the proximal and mid LAD.  There is moderate soft plaque stenosis in the mid first marginal branch.  The mid RCA was occluded.  CT FFR evaluation showed evidence of significant functional stenosis in the LAD and RCA with an FFR of 0.51 in the LAD.  He underwent cardiac catheterization on 05/19/2024 which showed severe three-vessel coronary disease with 80 to 90% long segment stenosis in the mid LAD and moderate diffuse  disease in the left main.  There is chronic total occlusion of the OM1 and distal RCA filling by collaterals.  He is here today with his wife.  He has not been going back to the gym and has not been exerting himself at all since his diagnosis.  He does report some sensation in his chest that he says is greater awareness associated with some shortness of breath usually with exertion.  He feels like the anxiety of his diagnosis is contributing.  Past Medical History:  Diagnosis Date   Blood in stool    on and off since 2014    COVID-19    2021 and 11 or 08/2021   Elevated blood pressure reading    Fatty liver    Heart murmur    Hyperlipidemia    Hypertension     Past Surgical History:  Procedure Laterality Date   LEFT HEART CATH AND CORONARY ANGIOGRAPHY Left 05/19/2024   Procedure: LEFT HEART CATH AND CORONARY ANGIOGRAPHY;  Surgeon: Mady Bruckner, MD;  Location: [facility] IN[facility]IVE [facility];  Service: Cardiovascular;  Laterality: Left;   left knee surgery Left    arthroscopy   UMBILICAL HERNIA REPAIR N/A 07/30/2018   Procedure: HERNIA REPAIR UMBILICAL ADULT;  Surgeon: Marolyn Nest, MD;  Location: [facility] ORS;  Service: General;  Laterality: N/A;    Family History  Problem Relation Age of Onset  Parkinson's disease Mother    Drug abuse Son        died overdose    Social History Social History   Tobacco Use   Smoking status: Former    Types: Cigarettes   Smokeless tobacco: Never   Tobacco comments:    light   Vaping Use   Vaping status: Never Used  Substance Use Topics   Alcohol use: Yes    Comment: rarely   Drug use: Not Currently    Current Outpatient Medications  Medication Sig Dispense Refill   amLODipine  (NORVASC ) 2.5 MG tablet Take 2.5 mg by mouth daily.     aspirin  EC 81 MG tablet Take 1 tablet (81 mg total) by mouth daily. Swallow whole.     ezetimibe  (ZETIA ) 10 MG tablet Take 1 tablet (10 mg total) by mouth daily. 90 tablet 1   isosorbide  mononitrate (IMDUR )  30 MG 24 hr tablet Take 0.5 tablets (15 mg total) by mouth daily. 45 tablet 3   losartan  (COZAAR ) 50 MG tablet Take 2 tablets (100 mg total) by mouth at bedtime. 180 tablet 1   metoprolol  tartrate (LOPRESSOR ) 100 MG tablet TAKE 1 TABLET 2 HR PRIOR TO CARDIAC PROCEDURE 1 tablet 0   rosuvastatin  (CRESTOR ) 10 MG tablet Take 1 tablet (10 mg total) by mouth daily. 90 tablet 3   triamcinolone  cream (KENALOG ) 0.1 % Apply 1 Application topically 2 (two) times daily. To rash (Patient taking differently: Apply 1 Application topically 2 (two) times daily as needed (rash/itching).) 30 g 0   No current facility-administered medications for this visit.    No Known Allergies  Review of Systems  Constitutional:  Positive for activity change. Negative for fatigue.  HENT: Negative.    Eyes: Negative.   Respiratory:  Positive for shortness of breath.   Cardiovascular:  Negative for chest pain, palpitations and leg swelling.  Gastrointestinal: Negative.   Endocrine: Negative.   Genitourinary: Negative.   Musculoskeletal: Negative.   Allergic/Immunologic: Negative.   Neurological:  Negative for dizziness and syncope.  Hematological: Negative.   Psychiatric/Behavioral: Negative.      BP (!) 155/80   Pulse 79   Resp 20   Ht 5' 9 (1.753 m)   Wt 205 lb (93 kg)   SpO2 95% Comment: RA  BMI 30.27 kg/m  Physical Exam Constitutional:      Appearance: Normal appearance. He is normal weight.  HENT:     Head: Normocephalic and atraumatic.     Mouth/Throat:     Mouth: Mucous membranes are moist.     Pharynx: Oropharynx is clear.  Eyes:     Extraocular Movements: Extraocular movements intact.     Conjunctiva/sclera: Conjunctivae normal.     Pupils: Pupils are equal, round, and reactive to light.  Cardiovascular:     Rate and Rhythm: Normal rate and regular rhythm.     Pulses: Normal pulses.     Heart sounds: Normal heart sounds. No murmur heard. Pulmonary:     Effort: Pulmonary effort is normal.      Breath sounds: Normal breath sounds.  Abdominal:     General: There is no distension.     Tenderness: There is no abdominal tenderness.  Musculoskeletal:        General: No swelling.     Cervical back: Normal range of motion and neck supple.  Skin:    General: Skin is warm and dry.  Neurological:     General: No focal deficit present.  Mental Status: He is alert and oriented to person, place, and time.  Psychiatric:        Mood and Affect: Mood normal.        Behavior: Behavior normal.      Diagnostic Tests:    ECHOCARDIOGRAM REPORT       Patient Name:   TALYN DESSERT Date of Exam: 05/06/2024 Medical Rec #:  [id number]       Height:       69.0 in Accession #:    [id number]      Weight:       203.8 lb Date of Birth:  [birth year]       BSA:          2.083 m Patient Age:    68 years        BP:           111/75 mmHg Patient Gender: M               HR:           72 bpm. Exam Location:  [place]  Procedure: 2D Echo, 3D Echo, Cardiac Doppler, Color Doppler and Strain Analysis            (Both Spectral and Color Flow Doppler were utilized during            procedure).  Indications:    R06.9 DOE   History:        Patient has no prior history of Echocardiogram examinations.                 Signs/Symptoms:Shortness of Breath and Dyspnea; Risk                 Factors:Hypertension, Diabetes and Dyslipidemia.   Sonographer:    Doyal Point MHA, BS, RDCS Referring Phys: [id number] TERESA L TULLO  IMPRESSIONS    1. Left ventricular ejection fraction, by estimation, is 55 to 60%. Left ventricular ejection fraction by 3D volume is 52 %. The left ventricle has normal function. The left ventricle has no regional wall motion abnormalities. Left ventricular diastolic  parameters are consistent with Grade I diastolic dysfunction (impaired relaxation). The average left ventricular global longitudinal strain is -19.0 %. The global longitudinal strain is normal.  2. Right  ventricular systolic function is normal. The right ventricular size is normal.  3. The mitral valve is normal in structure. Mild mitral valve regurgitation.  4. The aortic valve is tricuspid. Aortic valve regurgitation is mild.  5. The inferior vena cava is normal in size with greater than 50% respiratory variability, suggesting right atrial pressure of 3 mmHg.  FINDINGS  Left Ventricle: Left ventricular ejection fraction, by estimation, is 55 to 60%. Left ventricular ejection fraction by 3D volume is 52 %. The left ventricle has normal function. The left ventricle has no regional wall motion abnormalities. The average left ventricular global longitudinal strain is -19.0 %. Strain was performed and the global longitudinal strain is normal. The left ventricular internal cavity size was normal in size. There is no left ventricular hypertrophy. Left ventricular diastolic parameters are consistent with Grade I diastolic dysfunction (impaired relaxation).  Right Ventricle: The right ventricular size is normal. No increase in right ventricular wall thickness. Right ventricular systolic function is normal.  Left Atrium: Left atrial size was normal in size.  Right Atrium: Right atrial size was normal in size.  Pericardium: There is no evidence of pericardial effusion.  Mitral Valve: The mitral valve is normal in  structure. Mild mitral annular calcification. Mild mitral valve regurgitation.  Tricuspid Valve: The tricuspid valve is normal in structure. Tricuspid valve regurgitation is not demonstrated.  Aortic Valve: The aortic valve is tricuspid. Aortic valve regurgitation is mild. Aortic valve mean gradient measures 6.0 mmHg. Aortic valve peak gradient measures 10.8 mmHg. Aortic valve area, by VTI measures 2.42 cm.  Pulmonic Valve: The pulmonic valve was normal in structure. Pulmonic valve regurgitation is mild.  Aorta: The aortic root and ascending aorta are structurally normal,  with no evidence of dilitation.  Venous: The inferior vena cava is normal in size with greater than 50% respiratory variability, suggesting right atrial pressure of 3 mmHg.  IAS/Shunts: No atrial level shunt detected by color flow Doppler.    LEFT VENTRICLE PLAX 2D LVOT diam:     1.80 cm         Diastology LV SV:         76              LV e' medial:    7.07 cm/s LV SV Index:   37              LV E/e' medial:  12.7 LVOT Area:     2.54 cm        LV e' lateral:   8.49 cm/s                                LV E/e' lateral: 10.6                                  2D Longitudinal                                Strain                                2D Strain GLS   -19.0 %                                Avg:                                  3D Volume EF                                LV 3D EF:    Left                                             ventricul                                             ar  ejection                                             fraction                                             by 3D                                             volume is                                             52 %.                                  3D Volume EF:                                3D EF:        52 %                                LV EDV:       172 ml                                LV ESV:       82 ml                                LV SV:        90 ml  RIGHT VENTRICLE RV Basal diam:  2.50 cm RV Mid diam:    2.13 cm RV S prime:     17.00 cm/s TAPSE (M-mode): 2.8 cm  LEFT ATRIUM             Index        RIGHT ATRIUM           Index LA Vol (A2C):   33.7 ml 16.18 ml/m  RA Area:     13.40 cm LA Vol (A4C):   58.0 ml 27.85 ml/m  RA Volume:   27.10 ml  13.01 ml/m LA Biplane Vol: 43.8 ml 21.03 ml/m  AORTIC VALVE AV Area (Vmax):    2.53 cm AV Area (Vmean):   2.32 cm AV Area (VTI):     2.42 cm AV Vmax:           164.00 cm/s AV  Vmean:          109.000 cm/s AV VTI:            0.314 m AV Peak Grad:      10.8 mmHg AV Mean Grad:      6.0 mmHg LVOT Vmax:         163.00 cm/s LVOT Vmean:  99.200 cm/s LVOT VTI:          0.299 m LVOT/AV VTI ratio: 0.95   AORTA Ao Sinus diam: 3.82 cm Ao Asc diam:   3.70 cm  MITRAL VALVE MV Area (PHT): 3.72 cm     SHUNTS MV Decel Time: 204 msec     Systemic VTI:  0.30 m MV E velocity: 89.70 cm/s   Systemic Diam: 1.80 cm MV A velocity: 145.00 cm/s MV E/A ratio:  0.62  Redell Cave MD Electronically signed by Redell Cave MD Signature Date/Time: 05/06/2024/2:25:32 PM       Final      rocedures  LEFT HEART CATH AND CORONARY ANGIOGRAPHY   Conclusion  Conclusions: Severe three-vessel coronary artery disease, including long segment of moderate to severe mid LAD disease of up to 80-90%, moderate diffuse disease of the LMCA, and chronic total occlusions of OM1 and distal RCA. Normal left ventricular filling pressure (LVEDP 10 mmHg).   Recommendations: Outpatient cardiac surgery consultation for CABG. Continue antianginal therapy with amlodipine  and isosorbide  mononitrate. Continue rosuvastatin  and ezetimibe  for secondary prevention of coronary artery disease; consider escalation of rosuvastatin  to 20-40 mg daily if LDL remains above 70 on follow-up labs.   Lonni Hanson, MD [facility]     Recommendations  Antiplatelet/Anticoag Continue aspirin  81 mg daily pending cardiac surgery consultation.  Discharge Date In the absence of any other complications or medical issues, we expect the patient to be ready for discharge from a cath perspective on 05/19/2024.   Clinical Presentation  CHF/Shock Congestive heart failure not present. No shock present.   Procedural Details  Technical Details Indication: 68 y.o. year-old man with history of PSVT, hypertension, hyperlipidemia, and prediabetes, referred for evaluation of dyspnea on exertion, dizziness, and  vague chest discomfort with exertion over the last year and recent coronary CTA demonstrating significant multivessel CAD.  GFR: >60 ml/min  Procedure: The risks, benefits, complications, treatment options, and expected outcomes were discussed with the patient.  The patient and/or family concurred with the proposed plan, giving informed consent.  The right wrist was prepped and draped in a sterile fashion.  1% lidocaine  was used for local anesthesia.  Using the modified Seldinger access technique, a 62F slender Glidesheath was placed in the right radial artery.  2.5 mg Verapamil  was given through the sheath.  Heparin  4,500 units were administered.  Selective coronary angiography was performed using a 26F JL3.5 catheter to engage the left coronary artery and a 26F TIG4.0 catheter to engage the right coronary artery. Left heart catheterization was performed using a 26F TIG4.0 catheter.  Left ventriculogram was not performed.  At the end of the procedure, an additional dose of verapamil  2.5 mg was administered via the right radial artery sheath.  The sheath was removed and a TR band applied to achieve patent hemostasis.  There were no immediate complications.  The patient was taken to the recovery area in stable condition.   Estimated blood loss <50 mL.   During this procedure medications were administered to achieve and maintain moderate conscious sedation while the patient's heart rate, blood pressure, and oxygen saturation were continuously monitored and I was present face-to-face 100% of this time. Katlyn Kiger Cardiovascular Specialist and [facility] Cardiovascular Specialist are independent, trained observers who assisted in the monitoring of the patient's level of consciousness.   Medications (Filter: Administrations occurring from [phone] to 0944 on 05/19/24) midazolam  (VERSED ) injection (mg)  Total dose: 2 mg Date/Time Rate/Dose/Volume Action   05/19/24 0913 2  mg Given   fentaNYL   (SUBLIMAZE ) injection (mcg)  Total dose: 25 mcg Date/Time Rate/Dose/Volume Action   05/19/24 0913 25 mcg Given   lidocaine  (PF) (XYLOCAINE ) 1 % injection (mL)  Total volume: 2 mL Date/Time Rate/Dose/Volume Action   05/19/24 0916 2 mL Given   verapamil  (ISOPTIN ) injection (mg)  Total dose: 2.5 mg Date/Time Rate/Dose/Volume Action   05/19/24 0916 2.5 mg Given   heparin  sodium (porcine) injection (Units)  Total dose: 4,500 Units Date/Time Rate/Dose/Volume Action   05/19/24 0917 4,500 Units Given   Heparin  (Porcine) in NaCl 1000-0.9 UT/500ML-% SOLN (mL)  Total volume: 1,000 mL Date/Time Rate/Dose/Volume Action   05/19/24 0943 1,000 mL Given   iohexol  (OMNIPAQUE ) 300 MG/ML solution (mL)  Total volume: 45 mL Date/Time Rate/Dose/Volume Action   05/19/24 0944 45 mL Given    Contrast     Administrations occurring from 0852 to 0944 on 05/19/24:  Medication Name Total Dose  iohexol  (OMNIPAQUE ) 300 MG/ML solution 45 mL   Radiation/Fluoro  Fluoro time: 3.5 (min) DAP: 27 (Gycm2) Cumulative Air Kerma: 405 (mGy) Complications  Complications documented before study signed (05/19/2024 10:15 AM)   No complications were associated with this study.  Documented by Arvil Stacy BROCKS - 05/19/2024  9:31 AM     Coronary Findings  Diagnostic Dominance: Right Left Main  Vessel is large. There is moderate diffuse disease throughout the vessel.    Left Anterior Descending  Vessel is moderate in size.  Mid LAD-1 lesion is 90% stenosed.  Mid LAD-2 lesion is 50% stenosed.  Mid LAD-3 lesion is 80% stenosed.    First Diagonal Branch  Vessel is small in size.    First Septal Branch  Vessel is large in size.    Second Diagonal Branch  Vessel is moderate in size.    Third Diagonal Branch  Vessel is small in size.    Left Circumflex  Vessel is moderate in size.    First Obtuse Marginal Branch  Vessel is moderate in size.  Collaterals  1st Mrg filled by collaterals from 2nd Diag.     1st Mrg lesion is 100% stenosed. The lesion is chronically occluded.    Right Coronary Artery  Vessel is large.  Prox RCA lesion is 30% stenosed.  Mid RCA lesion is 50% stenosed.  Dist RCA lesion is 100% stenosed. The lesion is chronically occluded with left-to-right and right-to-right collateral flow.    Right Posterior Descending Artery  Collaterals  RPDA filled by collaterals from 1st Sept.      Right Posterior Atrioventricular Artery  Collaterals  RPAV filled by collaterals from RV Branch.      Intervention   No interventions have been documented.   Left Heart  Left Ventricle LV end diastolic pressure is normal. LVEDP 10 mmHg.  Aortic Valve There is no aortic valve stenosis.   Coronary Diagrams  Diagnostic Dominance: Right  Intervention   Implants   No implant documentation for this case.   Syngo Images   Show images for CARDIAC CATHETERIZATION Images on Long Term Storage   Show images for [place], [place] to Procedure Log  Procedure Log    Hemo Data  Flowsheet Row Most Recent Value  AO Systolic Pressure 115 mmHg  AO Diastolic Pressure 74 mmHg  AO Mean 92 mmHg  LV Systolic Pressure 137 mmHg  LV Diastolic Pressure -12 mmHg  LV EDP 5 mmHg  Arterial Occlusion Pressure Extended Systolic Pressure 127 mmHg  Arterial Occlusion Pressure Extended Diastolic Pressure 78 mmHg  Arterial Occlusion Pressure Extended Mean Pressure 99 mmHg  Left Ventricular Apex Extended Systolic Pressure 137 mmHg  LVp Diastolic Pressure -11 mmHg  Left Ventricular Apex Extended EDP Pressure 6 mmHg    Impression:  This 68 year old gentleman has severe three-vessel coronary disease with a long segment 80 to 90% mid LAD stenosis compromising a moderate-sized LAD and diagonal branch.  The obtuse marginal is occluded with filling via collaterals and is a moderate-sized vessel.  The RCA is occluded in its midportion with filling of the distal vessel by collaterals.  I  agree that coronary artery bypass graft surgery is the best treatment for resolution of his symptoms and to prevent left ventricular dysfunction.  I reviewed the cardiac catheterization images with the patient and his wife and answered their questions. I discussed the operative procedure with the patient and family including alternatives, benefits and risks; including but not limited to bleeding, blood transfusion, infection, stroke, myocardial infarction, graft failure, heart block requiring a permanent pacemaker, organ dysfunction, and death.  [name] understands and agrees to proceed.      Plan:  He will be scheduled for coronary bypass graft surgery on Tuesday, 05/26/2024.  I spent 60 minutes performing this consultation and > 50% of this time was spent face to face counseling and coordinating the care of this patient's severe multivessel coronary disease.  Dorise MARLA Fellers, MD [facility] [phone]

## 2024-05-21 NOTE — Pre-Procedure Instructions (Signed)
 Surgical Instructions   Your procedure is scheduled on May 26, 2024. Report to [facility] Main Entrance A at 5:30 A.M., then check in with the Admitting office. Any questions or running late day of surgery: call [phone]  Questions prior to your surgery date: call [phone], Monday-Friday, 8am-4pm. If you experience any cold or flu symptoms such as cough, fever, chills, shortness of breath, etc. between now and your scheduled surgery, please notify us  at the above number.     Remember:  Do not eat or drink after midnight the night before your surgery.    Take these medicines the morning of surgery with A SIP OF WATER  : Amlodipine  (Norvasc ) Ezetimibe  (Zetia ) Isosorbide  Mononitrate (Imdur ) Metoprolol  (Lopressor )--if you still have this medication Rosuvastatin  (Crestor )  Follow your surgeon's instructions when to STOP Aspirin .  If no instructions were given by your surgeon, then you will need to call the office to obtain instructions.   One week prior to surgery, STOP Aleve, Naproxen, Ibuprofen , Motrin , Advil , Goody's, BC's, all herbal medications, fish oil, and non-prescription vitamins.                     Do NOT Smoke (Tobacco/Vaping) for 24 hours prior to your procedure.  If you use a CPAP at night, you may bring your mask/headgear for your overnight stay.   You will be asked to remove any contacts, glasses, piercing's, hearing aid's, dentures/partials prior to surgery. Please bring cases for these items if needed.    Patients discharged the day of surgery will not be allowed to drive home, and someone needs to stay with them for 24 hours.  SURGICAL WAITING ROOM VISITATION Patients may have no more than 2 support people in the waiting area - these visitors may rotate.   Pre-op nurse will coordinate an appropriate time for 1 ADULT support person, who may not rotate, to accompany patient in pre-op.  Children under the age of 24 must have an adult with them who is  not the patient and must remain in the main waiting area with an adult.  If the patient needs to stay at the hospital during part of their recovery, the visitor guidelines for inpatient rooms apply.  Please refer to the [facility] website for the visitor guidelines for any additional information.   If you received a COVID test during your pre-op visit  it is requested that you wear a mask when out in public, stay away from anyone that may not be feeling well and notify your surgeon if you develop symptoms. If you have been in contact with anyone that has tested positive in the last 10 days please notify you surgeon.      Pre-operative CHG Bathing Instructions   You can play a key role in reducing the risk of infection after surgery. Your skin needs to be as free of germs as possible. You can reduce the number of germs on your skin by washing with CHG (chlorhexidine  gluconate) soap before surgery. CHG is an antiseptic soap that kills germs and continues to kill germs even after washing.   DO NOT use if you have an allergy to chlorhexidine /CHG or antibacterial soaps. If your skin becomes reddened or irritated, stop using the CHG and notify one of our RNs at [phone].              TAKE A SHOWER THE NIGHT BEFORE SURGERY AND THE DAY OF SURGERY    Please keep in mind the following:  DO NOT shave, including legs and underarms, 48 hours prior to surgery.   You may shave your face before/day of surgery.  Place clean sheets on your bed the night before surgery Use a clean washcloth (not used since being washed) for each shower. DO NOT sleep with pet's night before surgery.  CHG Shower Instructions:  Wash your face and private area with normal soap. If you choose to wash your hair, wash first with your normal shampoo.  After you use shampoo/soap, rinse your hair and body thoroughly to remove shampoo/soap residue.  Turn the water  OFF and apply half the bottle of CHG soap to a CLEAN washcloth.   Apply CHG soap ONLY FROM YOUR NECK DOWN TO YOUR TOES (washing for 3-5 minutes)  DO NOT use CHG soap on face, private areas, open wounds, or sores.  Pay special attention to the area where your surgery is being performed.  If you are having back surgery, having someone wash your back for you may be helpful. Wait 2 minutes after CHG soap is applied, then you may rinse off the CHG soap.  Pat dry with a clean towel  Put on clean pajamas    Additional instructions for the day of surgery: DO NOT APPLY any lotions, deodorants, cologne, or perfumes.   Do not wear jewelry or makeup Do not wear nail polish, gel polish, artificial nails, or any other type of covering on natural nails (fingers and toes) Do not bring valuables to the hospital. [facility] is not responsible for valuables/personal belongings. Put on clean/comfortable clothes.  Please brush your teeth.  Ask your nurse before applying any prescription medications to the skin.

## 2024-05-22 ENCOUNTER — Encounter: Admitting: Surgery

## 2024-05-22 ENCOUNTER — Other Ambulatory Visit: Payer: Self-pay

## 2024-05-22 ENCOUNTER — Ambulatory Visit (HOSPITAL_COMMUNITY)
Admission: RE | Admit: 2024-05-22 | Discharge: 2024-05-22 | Disposition: A | Discharge status: Home / Self Care | Source: Ambulatory Visit | Attending: Surgery | Admitting: Surgery

## 2024-05-22 ENCOUNTER — Encounter (HOSPITAL_COMMUNITY)
Admission: RE | Admit: 2024-05-22 | Discharge: 2024-05-22 | Disposition: A | Discharge status: Home / Self Care | Source: Ambulatory Visit | Attending: Surgery | Admitting: Surgery

## 2024-05-22 ENCOUNTER — Encounter (HOSPITAL_COMMUNITY): Payer: Self-pay

## 2024-05-22 VITALS — BP 129/92 | HR 69 | Temp 98.3°F | Resp 18 | Ht 69.0 in | Wt 209.0 lb

## 2024-05-22 DIAGNOSIS — Z01818 Encounter for other preprocedural examination: Secondary | ICD-10-CM

## 2024-05-22 DIAGNOSIS — I10 Essential (primary) hypertension: Secondary | ICD-10-CM | POA: Insufficient documentation

## 2024-05-22 DIAGNOSIS — Z79899 Other long term (current) drug therapy: Secondary | ICD-10-CM | POA: Diagnosis not present

## 2024-05-22 DIAGNOSIS — I2510 Atherosclerotic heart disease of native coronary artery without angina pectoris: Secondary | ICD-10-CM

## 2024-05-22 DIAGNOSIS — E785 Hyperlipidemia, unspecified: Secondary | ICD-10-CM | POA: Insufficient documentation

## 2024-05-22 DIAGNOSIS — Z01812 Encounter for preprocedural laboratory examination: Secondary | ICD-10-CM | POA: Diagnosis present

## 2024-05-22 HISTORY — DX: Dyspnea, unspecified: R06.00

## 2024-05-22 HISTORY — DX: Prediabetes: R73.03

## 2024-05-22 HISTORY — DX: Anxiety disorder, unspecified: F41.9

## 2024-05-22 LAB — URINALYSIS, ROUTINE W REFLEX MICROSCOPIC
Bilirubin Urine: NEGATIVE
Glucose, UA: NEGATIVE mg/dL
Ketones, ur: NEGATIVE mg/dL
Leukocytes,Ua: NEGATIVE
Nitrite: NEGATIVE
Protein, ur: NEGATIVE mg/dL
Specific Gravity, Urine: 1.025 (ref 1.005–1.030)
pH: 5 (ref 5.0–8.0)

## 2024-05-22 LAB — CBC
HCT: 48.9 % (ref 39.0–52.0)
Hemoglobin: 15.8 g/dL (ref 13.0–17.0)
MCH: 29.3 pg (ref 26.0–34.0)
MCHC: 32.3 g/dL (ref 30.0–36.0)
MCV: 90.7 fL (ref 80.0–100.0)
Platelets: 256 K/uL (ref 150–400)
RBC: 5.39 MIL/uL (ref 4.22–5.81)
RDW: 12.8 % (ref 11.5–15.5)
WBC: 7 K/uL (ref 4.0–10.5)
nRBC: 0 % (ref 0.0–0.2)

## 2024-05-22 LAB — COMPREHENSIVE METABOLIC PANEL WITH GFR
ALT: 36 U/L (ref 0–44)
AST: 29 U/L (ref 15–41)
Albumin: 4 g/dL (ref 3.5–5.0)
Alkaline Phosphatase: 59 U/L (ref 38–126)
Anion gap: 7 (ref 5–15)
BUN: 23 mg/dL (ref 8–23)
CO2: 26 mmol/L (ref 22–32)
Calcium: 9.3 mg/dL (ref 8.9–10.3)
Chloride: 106 mmol/L (ref 98–111)
Creatinine, Ser: 1.08 mg/dL (ref 0.61–1.24)
GFR, Estimated: >60 mL/min (ref >60)
Glucose, Bld: 107 mg/dL — ABNORMAL HIGH (ref 70–99)
Potassium: 4.1 mmol/L (ref 3.5–5.1)
Sodium: 139 mmol/L (ref 135–145)
Total Bilirubin: 0.6 mg/dL (ref 0.0–1.2)
Total Protein: 7.3 g/dL (ref 6.5–8.1)

## 2024-05-22 LAB — TYPE AND SCREEN
ABO/RH(D): O NEG
Antibody Screen: NEGATIVE

## 2024-05-22 LAB — SURGICAL PCR SCREEN
MRSA, PCR: NEGATIVE
Staphylococcus aureus: POSITIVE — AB

## 2024-05-22 LAB — HEMOGLOBIN A1C
Hgb A1c MFr Bld: 5.7 % — ABNORMAL HIGH (ref 4.8–5.6)
Mean Plasma Glucose: 116.89 mg/dL

## 2024-05-22 LAB — PROTIME-INR
INR: 1 (ref 0.8–1.2)
Prothrombin Time: 13.4 s (ref 11.4–15.2)

## 2024-05-22 LAB — VAS US DOPPLER PRE CABG

## 2024-05-22 LAB — APTT: aPTT: 27 s (ref 24–36)

## 2024-05-22 NOTE — Progress Notes (Signed)
 PCP - Marylynn Verneita CROME, MD  Cardiologist - Dr. Evalene Lunger   Chest x-ray - 05/22/24 EKG - 05/12/24 Stress Test - reports had one ~20 years ago in [place] ECHO - 05/06/24 Cardiac Cath - 05/19/24  Sleep Study - denies CPAP -   Fasting Blood Sugar - n/a Checks Blood Sugar _____ times a day  Last dose of GLP1 agonist-  n/a GLP1 instructions:   Blood Thinner Instructions: Aspirin  Instructions: reports told to keep taking ASA  ERAS Protcol - NPO PRE-SURGERY Ensure or G2-   COVID TEST- n/a   Anesthesia review: Yes, Cardiac HX  Patient denies fever, cough and chest pain at PAT appointment. Endorses SOB with activity and feeling dizzy. Pt provided with CP precautions and proceed to ED this weekend if needed.

## 2024-05-22 NOTE — Progress Notes (Signed)
 VASCULAR LAB    Pre CABG Dopplers have been performed.  See CV proc for preliminary results.   Read Bonelli, RVT 05/22/2024, 11:02 AM

## 2024-05-25 MED ORDER — MILRINONE LACTATE IN DEXTROSE 20-5 MG/100ML-% IV SOLN
0.3000 ug/kg/min | INTRAVENOUS | Status: DC
  Filled 2024-05-25: qty 100

## 2024-05-25 MED ORDER — NITROGLYCERIN IN D5W 200-5 MCG/ML-% IV SOLN
2.0000 ug/min | INTRAVENOUS | Status: DC
  Filled 2024-05-25: qty 250

## 2024-05-25 MED ORDER — TRANEXAMIC ACID 1000 MG/10ML IV SOLN
1.5000 mg/kg/h | INTRAVENOUS | Status: AC
  Administered 2024-05-26: 1.5 mg/kg/h via INTRAVENOUS
  Filled 2024-05-25: qty 25

## 2024-05-25 MED ORDER — CEFAZOLIN SODIUM-DEXTROSE 2-4 GM/100ML-% IV SOLN
2.0000 g | INTRAVENOUS | Status: AC
  Administered 2024-05-26: 2 g via INTRAVENOUS
  Filled 2024-05-25: qty 100

## 2024-05-25 MED ORDER — TRANEXAMIC ACID (OHS) PUMP PRIME SOLUTION
2.0000 mg/kg | INTRAVENOUS | Status: DC
  Filled 2024-05-25: qty 1.9

## 2024-05-25 MED ORDER — EPINEPHRINE HCL 5 MG/250ML IV SOLN IN NS
0.0000 ug/min | INTRAVENOUS | Status: DC
Start: 2024-05-26 — End: 2024-05-27
  Filled 2024-05-25: qty 250

## 2024-05-25 MED ORDER — HEPARIN 30,000 UNITS/1000 ML (OHS) CELLSAVER SOLUTION
Status: DC
  Filled 2024-05-25: qty 1000

## 2024-05-25 MED ORDER — MAGNESIUM SULFATE 50 % IJ SOLN
40.0000 meq | INTRAMUSCULAR | Status: DC
  Filled 2024-05-25: qty 9.85

## 2024-05-25 MED ORDER — MANNITOL 20 % IV SOLN
INTRAVENOUS | Status: DC
  Filled 2024-05-25: qty 13

## 2024-05-25 MED ORDER — TRANEXAMIC ACID (OHS) BOLUS VIA INFUSION
15.0000 mg/kg | INTRAVENOUS | Status: AC
  Administered 2024-05-26: 1422 mg via INTRAVENOUS
  Filled 2024-05-25: qty 1422

## 2024-05-25 MED ORDER — INSULIN REGULAR(HUMAN) IN NACL 100-0.9 UT/100ML-% IV SOLN
INTRAVENOUS | Status: AC
  Administered 2024-05-26: 1.8 [IU]/h via INTRAVENOUS
  Filled 2024-05-25: qty 100

## 2024-05-25 MED ORDER — POTASSIUM CHLORIDE 2 MEQ/ML IV SOLN
80.0000 meq | INTRAVENOUS | Status: DC
  Filled 2024-05-25: qty 40

## 2024-05-25 MED ORDER — NOREPINEPHRINE 4 MG/250ML-% IV SOLN
0.0000 ug/min | INTRAVENOUS | Status: DC
  Filled 2024-05-25: qty 250

## 2024-05-25 MED ORDER — VANCOMYCIN HCL 1.5 G IV SOLR
1500.0000 mg | INTRAVENOUS | Status: AC
  Administered 2024-05-26: 1500 mg via INTRAVENOUS
  Filled 2024-05-25: qty 30

## 2024-05-25 MED ORDER — DEXMEDETOMIDINE HCL IN NACL 400 MCG/100ML IV SOLN
0.1000 ug/kg/h | INTRAVENOUS | Status: AC
  Administered 2024-05-26: .5 ug/kg/h via INTRAVENOUS
  Filled 2024-05-25: qty 100

## 2024-05-25 MED ORDER — PLASMA-LYTE A IV SOLN
INTRAVENOUS | Status: DC
  Filled 2024-05-25: qty 2.5

## 2024-05-25 MED ORDER — PHENYLEPHRINE HCL-NACL 20-0.9 MG/250ML-% IV SOLN
30.0000 ug/min | INTRAVENOUS | Status: AC
  Administered 2024-05-26: 30 ug/min via INTRAVENOUS
  Filled 2024-05-25: qty 250

## 2024-05-25 NOTE — H&P (Signed)
 [address], [place] [address]             [phone]     Cardiothoracic Surgery Admission History and Physical     PCP is Marylynn, Verneita CROME, MD Referring Provider is Marylynn Verneita CROME, MD       Chief Complaint  Patient presents with   Coronary Artery Disease          HPI:   The patient is a 68 year old gentleman with a history of hypertension and hyperlipidemia who had been very active exercising frequently and was evaluated by cardiology in [place] on 01/23/2023 for dizziness that developed while he was working out walking on a treadmill.  His troponins were negative and ECG was normal.  He had no history of chest discomfort or shortness of breath or fatigue and had been going to the gym 5 to 6 days/week and walking several miles on an incline treadmill.  It was felt that he was likely dehydrated at the time that he developed this dizziness.  He then established care with Dr. Gollan that was seen on 07/01/2023 for evaluation of dizziness.  He had a CTA of the chest on 01/23/2024 which was unremarkable.  A CT of the head showed no acute intracranial findings.  His PCP, Dr. Marylynn, got an echocardiogram on 05/06/2024 which showed a left ventricular ejection fraction of 55 to 60% with grade 1 diastolic dysfunction.  There is mild MR.  There is mild AI with a normal trileaflet aortic valve.  He subsequently had a CT coronary morphology study on 05/14/2024 which showed a coronary calcium  score 433 which was 79th percentile.  There was severe mixed plaque in the proximal and mid LAD.  There is moderate soft plaque stenosis in the mid first marginal branch.  The mid RCA was occluded.  CT FFR evaluation showed evidence of significant functional stenosis in the LAD and RCA with an FFR of 0.51 in the LAD.  He underwent cardiac catheterization on 05/19/2024 which showed severe three-vessel coronary disease with 80 to 90% long segment stenosis in the mid LAD and moderate diffuse  disease in the left main.  There is chronic total occlusion of the OM1 and distal RCA filling by collaterals.   He lives with his wife.  He has not been going back to the gym and has not been exerting himself at all since his diagnosis.  He does report some sensation in his chest that he says is greater awareness associated with some shortness of breath usually with exertion.  He feels like the anxiety of his diagnosis is contributing.       Past Medical History:  Diagnosis Date   Blood in stool      on and off since 2014    COVID-19      2021 and 11 or 08/2021   Elevated blood pressure reading     Fatty liver     Heart murmur     Hyperlipidemia     Hypertension                 Past Surgical History:  Procedure Laterality Date   LEFT HEART CATH AND CORONARY ANGIOGRAPHY Left 05/19/2024    Procedure: LEFT HEART CATH AND CORONARY ANGIOGRAPHY;  Surgeon: Mady Bruckner, MD;  Location: [facility] INVASIVE CV [facility];  Service: Cardiovascular;  Laterality: Left;   left knee surgery Left      arthroscopy   UMBILICAL HERNIA REPAIR  N/A 07/30/2018    Procedure: HERNIA REPAIR UMBILICAL ADULT;  Surgeon: Marolyn Nest, MD;  Location: [facility] ORS;  Service: General;  Laterality: N/A;               Family History  Problem Relation Age of Onset   Parkinson's disease Mother     Drug abuse Son          died overdose          Social History Social History  Social History         Tobacco Use   Smoking status: Former      Types: Cigarettes   Smokeless tobacco: Never   Tobacco comments:      light   Vaping Use   Vaping status: Never Used  Substance Use Topics   Alcohol use: Yes      Comment: rarely   Drug use: Not Currently              Current Outpatient Medications  Medication Sig Dispense Refill   amLODipine  (NORVASC ) 2.5 MG tablet Take 2.5 mg by mouth daily.       aspirin  EC 81 MG tablet Take 1 tablet (81 mg total) by mouth daily. Swallow whole.       ezetimibe  (ZETIA ) 10 MG  tablet Take 1 tablet (10 mg total) by mouth daily. 90 tablet 1   isosorbide  mononitrate (IMDUR ) 30 MG 24 hr tablet Take 0.5 tablets (15 mg total) by mouth daily. 45 tablet 3   losartan  (COZAAR ) 50 MG tablet Take 2 tablets (100 mg total) by mouth at bedtime. 180 tablet 1   metoprolol  tartrate (LOPRESSOR ) 100 MG tablet TAKE 1 TABLET 2 HR PRIOR TO CARDIAC PROCEDURE 1 tablet 0   rosuvastatin  (CRESTOR ) 10 MG tablet Take 1 tablet (10 mg total) by mouth daily. 90 tablet 3   triamcinolone  cream (KENALOG ) 0.1 % Apply 1 Application topically 2 (two) times daily. To rash (Patient taking differently: Apply 1 Application topically 2 (two) times daily as needed (rash/itching).) 30 g 0      No current facility-administered medications for this visit.        Allergies  No Known Allergies     Review of Systems  Constitutional:  Positive for activity change. Negative for fatigue.  HENT: Negative.    Eyes: Negative.   Respiratory:  Positive for shortness of breath.   Cardiovascular:  Negative for chest pain, palpitations and leg swelling.  Gastrointestinal: Negative.   Endocrine: Negative.   Genitourinary: Negative.   Musculoskeletal: Negative.   Allergic/Immunologic: Negative.   Neurological:  Negative for dizziness and syncope.  Hematological: Negative.   Psychiatric/Behavioral: Negative.       BP (!) 155/80   Pulse 79   Resp 20   Ht 5' 9 (1.753 m)   Wt 205 lb (93 kg)   SpO2 95% Comment: RA  BMI 30.27 kg/m  Physical Exam Constitutional:      Appearance: Normal appearance. He is normal weight.  HENT:     Head: Normocephalic and atraumatic.     Mouth/Throat:     Mouth: Mucous membranes are moist.     Pharynx: Oropharynx is clear.  Eyes:     Extraocular Movements: Extraocular movements intact.     Conjunctiva/sclera: Conjunctivae normal.     Pupils: Pupils are equal, round, and reactive to light.  Cardiovascular:     Rate and Rhythm: Normal rate and regular rhythm.     Pulses:  Normal pulses.  Heart sounds: Normal heart sounds. No murmur heard. Pulmonary:     Effort: Pulmonary effort is normal.     Breath sounds: Normal breath sounds.  Abdominal:     General: There is no distension.     Tenderness: There is no abdominal tenderness.  Musculoskeletal:        General: No swelling.     Cervical back: Normal range of motion and neck supple.  Skin:    General: Skin is warm and dry.  Neurological:     General: No focal deficit present.     Mental Status: He is alert and oriented to person, place, and time.  Psychiatric:        Mood and Affect: Mood normal.        Behavior: Behavior normal.         Diagnostic Tests:     ECHOCARDIOGRAM REPORT       Patient Name:   Marc Myers Date of Exam: 05/06/2024 Medical Rec #:  [id number]       Height:       69.0 in Accession #:    [id number]      Weight:       203.8 lb Date of Birth:  Aug 09, 1956       BSA:          2.083 m Patient Age:    68 years        BP:           111/75 mmHg Patient Gender: M               HR:           72 bpm. Exam Location:  [facility]  Procedure: 2D Echo, 3D Echo, Cardiac Doppler, Color Doppler and Strain Analysis            (Both Spectral and Color Flow Doppler were utilized during            procedure).  Indications:    R06.9 DOE   History:        Patient has no prior history of Echocardiogram examinations.                 Signs/Symptoms:Shortness of Breath and Dyspnea; Risk                 Factors:Hypertension, Diabetes and Dyslipidemia.   Sonographer:    Doyal Point MHA, BS, RDCS Referring Phys: [id number] TERESA L TULLO  IMPRESSIONS    1. Left ventricular ejection fraction, by estimation, is 55 to 60%. Left ventricular ejection fraction by 3D volume is 52 %. The left ventricle has normal function. The left ventricle has no regional wall motion abnormalities. Left ventricular diastolic  parameters are consistent with Grade I diastolic dysfunction (impaired relaxation).  The average left ventricular global longitudinal strain is -19.0 %. The global longitudinal strain is normal.  2. Right ventricular systolic function is normal. The right ventricular size is normal.  3. The mitral valve is normal in structure. Mild mitral valve regurgitation.  4. The aortic valve is tricuspid. Aortic valve regurgitation is mild.  5. The inferior vena cava is normal in size with greater than 50% respiratory variability, suggesting right atrial pressure of 3 mmHg.  FINDINGS  Left Ventricle: Left ventricular ejection fraction, by estimation, is 55 to 60%. Left ventricular ejection fraction by 3D volume is 52 %. The left ventricle has normal function. The left ventricle has no regional wall motion abnormalities. The average left ventricular global longitudinal  strain is -19.0 %. Strain was performed and the global longitudinal strain is normal. The left ventricular internal cavity size was normal in size. There is no left ventricular hypertrophy. Left ventricular diastolic parameters are consistent with Grade I diastolic dysfunction (impaired relaxation).  Right Ventricle: The right ventricular size is normal. No increase in right ventricular wall thickness. Right ventricular systolic function is normal.  Left Atrium: Left atrial size was normal in size.  Right Atrium: Right atrial size was normal in size.  Pericardium: There is no evidence of pericardial effusion.  Mitral Valve: The mitral valve is normal in structure. Mild mitral annular calcification. Mild mitral valve regurgitation.  Tricuspid Valve: The tricuspid valve is normal in structure. Tricuspid valve regurgitation is not demonstrated.  Aortic Valve: The aortic valve is tricuspid. Aortic valve regurgitation is mild. Aortic valve mean gradient measures 6.0 mmHg. Aortic valve peak gradient measures 10.8 mmHg. Aortic valve area, by VTI measures 2.42 cm.  Pulmonic Valve: The pulmonic valve was normal in  structure. Pulmonic valve regurgitation is mild.  Aorta: The aortic root and ascending aorta are structurally normal, with no evidence of dilitation.  Venous: The inferior vena cava is normal in size with greater than 50% respiratory variability, suggesting right atrial pressure of 3 mmHg.  IAS/Shunts: No atrial level shunt detected by color flow Doppler.    LEFT VENTRICLE PLAX 2D LVOT diam:     1.80 cm         Diastology LV SV:         76              LV e' medial:    7.07 cm/s LV SV Index:   37              LV E/e' medial:  12.7 LVOT Area:     2.54 cm        LV e' lateral:   8.49 cm/s                                LV E/e' lateral: 10.6                                  2D Longitudinal                                Strain                                2D Strain GLS   -19.0 %                                Avg:                                  3D Volume EF                                LV 3D EF:    Left  ventricul                                             ar                                             ejection                                             fraction                                             by 3D                                             volume is                                             52 %.                                  3D Volume EF:                                3D EF:        52 %                                LV EDV:       172 ml                                LV ESV:       82 ml                                LV SV:        90 ml  RIGHT VENTRICLE RV Basal diam:  2.50 cm RV Mid diam:    2.13 cm RV S prime:     17.00 cm/s TAPSE (M-mode): 2.8 cm  LEFT ATRIUM             Index        RIGHT ATRIUM           Index LA Vol (A2C):   33.7 ml 16.18 ml/m  RA Area:     13.40 cm LA Vol (A4C):   58.0 ml 27.85 ml/m  RA Volume:   27.10 ml  13.01 ml/m LA Biplane Vol: 43.8 ml 21.03 ml/m  AORTIC  VALVE AV Area (Vmax):    2.53 cm AV Area (  Vmean):   2.32 cm AV Area (VTI):     2.42 cm AV Vmax:           164.00 cm/s AV Vmean:          109.000 cm/s AV VTI:            0.314 m AV Peak Grad:      10.8 mmHg AV Mean Grad:      6.0 mmHg LVOT Vmax:         163.00 cm/s LVOT Vmean:        99.200 cm/s LVOT VTI:          0.299 m LVOT/AV VTI ratio: 0.95   AORTA Ao Sinus diam: 3.82 cm Ao Asc diam:   3.70 cm  MITRAL VALVE MV Area (PHT): 3.72 cm     SHUNTS MV Decel Time: 204 msec     Systemic VTI:  0.30 m MV E velocity: 89.70 cm/s   Systemic Diam: 1.80 cm MV A velocity: 145.00 cm/s MV E/A ratio:  0.62  Redell Cave MD Electronically signed by Redell Cave MD Signature Date/Time: 05/06/2024/2:25:32 PM       Final        rocedures   LEFT HEART CATH AND CORONARY ANGIOGRAPHY    Conclusion   Conclusions: Severe three-vessel coronary artery disease, including long segment of moderate to severe mid LAD disease of up to 80-90%, moderate diffuse disease of the LMCA, and chronic total occlusions of OM1 and distal RCA. Normal left ventricular filling pressure (LVEDP 10 mmHg).   Recommendations: Outpatient cardiac surgery consultation for CABG. Continue antianginal therapy with amlodipine  and isosorbide  mononitrate. Continue rosuvastatin  and ezetimibe  for secondary prevention of coronary artery disease; consider escalation of rosuvastatin  to 20-40 mg daily if LDL remains above 70 on follow-up labs.   Lonni Hanson, MD [facility]     Recommendations       Antiplatelet/Anticoag Continue aspirin  81 mg daily pending cardiac surgery consultation.  Discharge Date In the absence of any other complications or medical issues, we expect the patient to be ready for discharge from a cath perspective on 05/19/2024.    Clinical Presentation   CHF/Shock Congestive heart failure not present. No shock present.    Procedural Details   Technical Details Indication: 68 y.o.  year-old man with history of PSVT, hypertension, hyperlipidemia, and prediabetes, referred for evaluation of dyspnea on exertion, dizziness, and vague chest discomfort with exertion over the last year and recent coronary CTA demonstrating significant multivessel CAD.  GFR: >60 ml/min  Procedure: The risks, benefits, complications, treatment options, and expected outcomes were discussed with the patient.  The patient and/or family concurred with the proposed plan, giving informed consent.  The right wrist was prepped and draped in a sterile fashion.  1% lidocaine  was used for local anesthesia.  Using the modified Seldinger access technique, a 63F slender Glidesheath was placed in the right radial artery.  2.5 mg Verapamil  was given through the sheath.  Heparin  4,500 units were administered.  Selective coronary angiography was performed using a 7F JL3.5 catheter to engage the left coronary artery and a 7F TIG4.0 catheter to engage the right coronary artery. Left heart catheterization was performed using a 7F TIG4.0 catheter.  Left ventriculogram was not performed.  At the end of the procedure, an additional dose of verapamil  2.5 mg was administered via the right radial artery sheath.  The sheath was removed and a TR band applied to achieve patent hemostasis.  There were no  immediate complications.  The patient was taken to the recovery area in stable condition.   Estimated blood loss <50 mL.   During this procedure medications were administered to achieve and maintain moderate conscious sedation while the patient's heart rate, blood pressure, and oxygen saturation were continuously monitored and I was present face-to-face 100% of this time. Katlyn Kiger Cardiovascular Specialist and [facility] Cardiovascular Specialist are independent, trained observers who assisted in the monitoring of the patient's level of consciousness.    Medications (Filter: Administrations occurring from [phone] to 0944 on  05/19/24) midazolam  (VERSED ) injection (mg)  Total dose: 2 mg Date/Time Rate/Dose/Volume Action    05/19/24 0913 2 mg Given    fentaNYL  (SUBLIMAZE ) injection (mcg)  Total dose: 25 mcg Date/Time Rate/Dose/Volume Action    05/19/24 0913 25 mcg Given    lidocaine  (PF) (XYLOCAINE ) 1 % injection (mL)  Total volume: 2 mL Date/Time Rate/Dose/Volume Action    05/19/24 0916 2 mL Given    verapamil  (ISOPTIN ) injection (mg)  Total dose: 2.5 mg Date/Time Rate/Dose/Volume Action    05/19/24 0916 2.5 mg Given    heparin  sodium (porcine) injection (Units)  Total dose: 4,500 Units Date/Time Rate/Dose/Volume Action    05/19/24 0917 4,500 Units Given    Heparin  (Porcine) in NaCl 1000-0.9 UT/500ML-% SOLN (mL)  Total volume: 1,000 mL Date/Time Rate/Dose/Volume Action    05/19/24 0943 1,000 mL Given    iohexol  (OMNIPAQUE ) 300 MG/ML solution (mL)  Total volume: 45 mL Date/Time Rate/Dose/Volume Action    05/19/24 0944 45 mL Given      Contrast        Administrations occurring from 0852 to 0944 on 05/19/24:  Medication Name Total Dose  iohexol  (OMNIPAQUE ) 300 MG/ML solution 45 mL    Radiation/Fluoro   Fluoro time: 3.5 (min) DAP: 27 (Gycm2) Cumulative Air Kerma: 405 (mGy) Complications   Complications documented before study signed (05/19/2024 10:15 AM)    No complications were associated with this study.  Documented by Arvil Stacy BROCKS - 05/19/2024  9:31 AM      Coronary Findings   Diagnostic Dominance: Right Left Main  Vessel is large. There is moderate diffuse disease throughout the vessel.    Left Anterior Descending  Vessel is moderate in size.  Mid LAD-1 lesion is 90% stenosed.  Mid LAD-2 lesion is 50% stenosed.  Mid LAD-3 lesion is 80% stenosed.    First Diagonal Branch  Vessel is small in size.    First Septal Branch  Vessel is large in size.    Second Diagonal Branch  Vessel is moderate in size.    Third Diagonal Branch  Vessel is small in size.    Left  Circumflex  Vessel is moderate in size.    First Obtuse Marginal Branch  Vessel is moderate in size.  Collaterals  1st Mrg filled by collaterals from 2nd Diag.     1st Mrg lesion is 100% stenosed. The lesion is chronically occluded.    Right Coronary Artery  Vessel is large.  Prox RCA lesion is 30% stenosed.  Mid RCA lesion is 50% stenosed.  Dist RCA lesion is 100% stenosed. The lesion is chronically occluded with left-to-right and right-to-right collateral flow.    Right Posterior Descending Artery  Collaterals  RPDA filled by collaterals from 1st Sept.       Right Posterior Atrioventricular Artery  Collaterals  RPAV filled by collaterals from RV Branch.       Intervention    No interventions have been documented.  Left Heart       Left Ventricle LV end diastolic pressure is normal. LVEDP 10 mmHg.  Aortic Valve There is no aortic valve stenosis.    Coronary Diagrams   Diagnostic Dominance: Right  Intervention    Implants    No implant documentation for this case.    Syngo Images    Show images for CARDIAC CATHETERIZATION Images on Long Term Storage    Show images for Gee, Habig to Procedure Log   Procedure Log    Hemo Data   Flowsheet Row Most Recent Value  AO Systolic Pressure 115 mmHg  AO Diastolic Pressure 74 mmHg  AO Mean 92 mmHg  LV Systolic Pressure 137 mmHg  LV Diastolic Pressure -12 mmHg  LV EDP 5 mmHg  Arterial Occlusion Pressure Extended Systolic Pressure 127 mmHg  Arterial Occlusion Pressure Extended Diastolic Pressure 78 mmHg  Arterial Occlusion Pressure Extended Mean Pressure 99 mmHg  Left Ventricular Apex Extended Systolic Pressure 137 mmHg  LVp Diastolic Pressure -11 mmHg  Left Ventricular Apex Extended EDP Pressure 6 mmHg      Impression:   This 68 year old gentleman has severe three-vessel coronary disease with a long segment 80 to 90% mid LAD stenosis compromising a moderate-sized LAD and diagonal  branch.  The obtuse marginal is occluded with filling via collaterals and is a moderate-sized vessel.  The RCA is occluded in its midportion with filling of the distal vessel by collaterals.  I agree that coronary artery bypass graft surgery is the best treatment for resolution of his symptoms and to prevent left ventricular dysfunction.  I reviewed the cardiac catheterization images with the patient and his wife and answered their questions. I discussed the operative procedure with the patient and family including alternatives, benefits and risks; including but not limited to bleeding, blood transfusion, infection, stroke, myocardial infarction, graft failure, heart block requiring a permanent pacemaker, organ dysfunction, and death.  Lamar Margrette understands and agrees to proceed.       Plan:   Coronary bypass graft surgery.      Dorise MARLA Fellers, MD [facility] [phone]

## 2024-05-26 ENCOUNTER — Inpatient Hospital Stay (HOSPITAL_COMMUNITY)
Admission: RE | Disposition: A | Discharge status: Home / Self Care | Payer: Self-pay | Source: Home / Self Care | Attending: Surgery

## 2024-05-26 ENCOUNTER — Encounter (HOSPITAL_COMMUNITY): Payer: Self-pay | Admitting: Surgery

## 2024-05-26 ENCOUNTER — Inpatient Hospital Stay (HOSPITAL_COMMUNITY): Payer: Self-pay | Admitting: Certified Registered Nurse Anesthetist

## 2024-05-26 ENCOUNTER — Inpatient Hospital Stay (HOSPITAL_COMMUNITY)
Admission: RE | Admit: 2024-05-26 | Discharge: 2024-06-02 | DRG: 236 | Disposition: A | Discharge status: Home / Self Care | Attending: Surgery | Admitting: Surgery

## 2024-05-26 ENCOUNTER — Inpatient Hospital Stay (HOSPITAL_COMMUNITY)

## 2024-05-26 ENCOUNTER — Other Ambulatory Visit: Payer: Self-pay

## 2024-05-26 DIAGNOSIS — J90 Pleural effusion, not elsewhere classified: Secondary | ICD-10-CM | POA: Diagnosis not present

## 2024-05-26 DIAGNOSIS — Z7982 Long term (current) use of aspirin: Secondary | ICD-10-CM | POA: Diagnosis not present

## 2024-05-26 DIAGNOSIS — I2510 Atherosclerotic heart disease of native coronary artery without angina pectoris: Secondary | ICD-10-CM

## 2024-05-26 DIAGNOSIS — Z79899 Other long term (current) drug therapy: Secondary | ICD-10-CM | POA: Diagnosis not present

## 2024-05-26 DIAGNOSIS — I4891 Unspecified atrial fibrillation: Secondary | ICD-10-CM | POA: Diagnosis not present

## 2024-05-26 DIAGNOSIS — Z951 Presence of aortocoronary bypass graft: Principal | ICD-10-CM

## 2024-05-26 DIAGNOSIS — Y838 Other surgical procedures as the cause of abnormal reaction of the patient, or of later complication, without mention of misadventure at the time of the procedure: Secondary | ICD-10-CM | POA: Diagnosis not present

## 2024-05-26 DIAGNOSIS — I10 Essential (primary) hypertension: Secondary | ICD-10-CM | POA: Diagnosis not present

## 2024-05-26 DIAGNOSIS — K5900 Constipation, unspecified: Secondary | ICD-10-CM | POA: Diagnosis not present

## 2024-05-26 DIAGNOSIS — E785 Hyperlipidemia, unspecified: Secondary | ICD-10-CM | POA: Diagnosis not present

## 2024-05-26 DIAGNOSIS — Z87891 Personal history of nicotine dependence: Secondary | ICD-10-CM | POA: Diagnosis not present

## 2024-05-26 DIAGNOSIS — F419 Anxiety disorder, unspecified: Secondary | ICD-10-CM | POA: Diagnosis present

## 2024-05-26 DIAGNOSIS — J9811 Atelectasis: Secondary | ICD-10-CM | POA: Diagnosis not present

## 2024-05-26 DIAGNOSIS — Z820 Family history of epilepsy and other diseases of the nervous system: Secondary | ICD-10-CM | POA: Diagnosis not present

## 2024-05-26 DIAGNOSIS — J939 Pneumothorax, unspecified: Secondary | ICD-10-CM | POA: Diagnosis not present

## 2024-05-26 DIAGNOSIS — J95811 Postprocedural pneumothorax: Secondary | ICD-10-CM | POA: Diagnosis not present

## 2024-05-26 DIAGNOSIS — Z8616 Personal history of COVID-19: Secondary | ICD-10-CM

## 2024-05-26 DIAGNOSIS — R0902 Hypoxemia: Secondary | ICD-10-CM | POA: Diagnosis not present

## 2024-05-26 DIAGNOSIS — D72829 Elevated white blood cell count, unspecified: Secondary | ICD-10-CM | POA: Diagnosis not present

## 2024-05-26 DIAGNOSIS — I088 Other rheumatic multiple valve diseases: Secondary | ICD-10-CM | POA: Diagnosis not present

## 2024-05-26 DIAGNOSIS — E119 Type 2 diabetes mellitus without complications: Secondary | ICD-10-CM | POA: Diagnosis not present

## 2024-05-26 DIAGNOSIS — I2582 Chronic total occlusion of coronary artery: Secondary | ICD-10-CM | POA: Diagnosis present

## 2024-05-26 HISTORY — PX: INTRAOPERATIVE TRANSESOPHAGEAL ECHOCARDIOGRAM: SHX5062

## 2024-05-26 HISTORY — PX: CORONARY ARTERY BYPASS GRAFT: SHX141

## 2024-05-26 LAB — POCT I-STAT, CHEM 8
BUN: 32 mg/dL — ABNORMAL HIGH (ref 8–23)
BUN: 26 mg/dL — ABNORMAL HIGH (ref 8–23)
BUN: 31 mg/dL — ABNORMAL HIGH (ref 8–23)
BUN: 29 mg/dL — ABNORMAL HIGH (ref 8–23)
BUN: 27 mg/dL — ABNORMAL HIGH (ref 8–23)
BUN: 27 mg/dL — ABNORMAL HIGH (ref 8–23)
Calcium, Ion: 1.24 mmol/L (ref 1.15–1.40)
Calcium, Ion: 0.99 mmol/L — ABNORMAL LOW (ref 1.15–1.40)
Calcium, Ion: 1.24 mmol/L (ref 1.15–1.40)
Calcium, Ion: 1.03 mmol/L — ABNORMAL LOW (ref 1.15–1.40)
Calcium, Ion: 1.02 mmol/L — ABNORMAL LOW (ref 1.15–1.40)
Calcium, Ion: 1.04 mmol/L — ABNORMAL LOW (ref 1.15–1.40)
Chloride: 105 mmol/L (ref 98–111)
Chloride: 101 mmol/L (ref 98–111)
Chloride: 104 mmol/L (ref 98–111)
Chloride: 103 mmol/L (ref 98–111)
Chloride: 104 mmol/L (ref 98–111)
Chloride: 104 mmol/L (ref 98–111)
Creatinine, Ser: 1 mg/dL (ref 0.61–1.24)
Creatinine, Ser: 0.8 mg/dL (ref 0.61–1.24)
Creatinine, Ser: 1 mg/dL (ref 0.61–1.24)
Creatinine, Ser: 0.9 mg/dL (ref 0.61–1.24)
Creatinine, Ser: 0.8 mg/dL (ref 0.61–1.24)
Creatinine, Ser: 0.9 mg/dL (ref 0.61–1.24)
Glucose, Bld: 123 mg/dL — ABNORMAL HIGH (ref 70–99)
Glucose, Bld: 108 mg/dL — ABNORMAL HIGH (ref 70–99)
Glucose, Bld: 92 mg/dL (ref 70–99)
Glucose, Bld: 103 mg/dL — ABNORMAL HIGH (ref 70–99)
Glucose, Bld: 114 mg/dL — ABNORMAL HIGH (ref 70–99)
Glucose, Bld: 120 mg/dL — ABNORMAL HIGH (ref 70–99)
HCT: 42 % (ref 39.0–52.0)
HCT: 31 % — ABNORMAL LOW (ref 39.0–52.0)
HCT: 40 % (ref 39.0–52.0)
HCT: 35 % — ABNORMAL LOW (ref 39.0–52.0)
HCT: 33 % — ABNORMAL LOW (ref 39.0–52.0)
HCT: 33 % — ABNORMAL LOW (ref 39.0–52.0)
Hemoglobin: 14.3 g/dL (ref 13.0–17.0)
Hemoglobin: 10.5 g/dL — ABNORMAL LOW (ref 13.0–17.0)
Hemoglobin: 13.6 g/dL (ref 13.0–17.0)
Hemoglobin: 11.9 g/dL — ABNORMAL LOW (ref 13.0–17.0)
Hemoglobin: 11.2 g/dL — ABNORMAL LOW (ref 13.0–17.0)
Hemoglobin: 11.2 g/dL — ABNORMAL LOW (ref 13.0–17.0)
Potassium: 4.7 mmol/L (ref 3.5–5.1)
Potassium: 4.4 mmol/L (ref 3.5–5.1)
Potassium: 5.2 mmol/L — ABNORMAL HIGH (ref 3.5–5.1)
Potassium: 5.1 mmol/L (ref 3.5–5.1)
Potassium: 4.4 mmol/L (ref 3.5–5.1)
Potassium: 5.1 mmol/L (ref 3.5–5.1)
Sodium: 138 mmol/L (ref 135–145)
Sodium: 139 mmol/L (ref 135–145)
Sodium: 139 mmol/L (ref 135–145)
Sodium: 137 mmol/L (ref 135–145)
Sodium: 136 mmol/L (ref 135–145)
Sodium: 137 mmol/L (ref 135–145)
TCO2: 26 mmol/L (ref 22–32)
TCO2: 25 mmol/L (ref 22–32)
TCO2: 29 mmol/L (ref 22–32)
TCO2: 27 mmol/L (ref 22–32)
TCO2: 23 mmol/L (ref 22–32)
TCO2: 25 mmol/L (ref 22–32)

## 2024-05-26 LAB — CBC
HCT: 35.7 % — ABNORMAL LOW (ref 39.0–52.0)
HCT: 36.8 % — ABNORMAL LOW (ref 39.0–52.0)
Hemoglobin: 12.2 g/dL — ABNORMAL LOW (ref 13.0–17.0)
Hemoglobin: 12.7 g/dL — ABNORMAL LOW (ref 13.0–17.0)
MCH: 30.1 pg (ref 26.0–34.0)
MCH: 30.5 pg (ref 26.0–34.0)
MCHC: 34.2 g/dL (ref 30.0–36.0)
MCHC: 34.5 g/dL (ref 30.0–36.0)
MCV: 88.1 fL (ref 80.0–100.0)
MCV: 88.5 fL (ref 80.0–100.0)
Platelets: 160 K/uL (ref 150–400)
Platelets: 159 K/uL (ref 150–400)
RBC: 4.05 MIL/uL — ABNORMAL LOW (ref 4.22–5.81)
RBC: 4.16 MIL/uL — ABNORMAL LOW (ref 4.22–5.81)
RDW: 12.4 % (ref 11.5–15.5)
RDW: 12.5 % (ref 11.5–15.5)
WBC: 13.6 K/uL — ABNORMAL HIGH (ref 4.0–10.5)
WBC: 12.4 K/uL — ABNORMAL HIGH (ref 4.0–10.5)
nRBC: 0 % (ref 0.0–0.2)
nRBC: 0 % (ref 0.0–0.2)

## 2024-05-26 LAB — BASIC METABOLIC PANEL WITH GFR
Anion gap: 8 (ref 5–15)
BUN: 26 mg/dL — ABNORMAL HIGH (ref 8–23)
CO2: 23 mmol/L (ref 22–32)
Calcium: 7.5 mg/dL — ABNORMAL LOW (ref 8.9–10.3)
Chloride: 105 mmol/L (ref 98–111)
Creatinine, Ser: 0.9 mg/dL (ref 0.61–1.24)
GFR, Estimated: >60 mL/min (ref >60)
Glucose, Bld: 140 mg/dL — ABNORMAL HIGH (ref 70–99)
Potassium: 4.2 mmol/L (ref 3.5–5.1)
Sodium: 136 mmol/L (ref 135–145)

## 2024-05-26 LAB — POCT I-STAT 7, (LYTES, BLD GAS, ICA,H+H)
Acid-base deficit: 2 mmol/L (ref 0.0–2.0)
Acid-base deficit: 7 mmol/L — ABNORMAL HIGH (ref 0.0–2.0)
Acid-base deficit: 1 mmol/L (ref 0.0–2.0)
Acid-base deficit: 2 mmol/L (ref 0.0–2.0)
Acid-base deficit: 4 mmol/L — ABNORMAL HIGH (ref 0.0–2.0)
Acid-base deficit: 4 mmol/L — ABNORMAL HIGH (ref 0.0–2.0)
Bicarbonate: 23.4 mmol/L (ref 20.0–28.0)
Bicarbonate: 18.6 mmol/L — ABNORMAL LOW (ref 20.0–28.0)
Bicarbonate: 22.8 mmol/L (ref 20.0–28.0)
Bicarbonate: 23.6 mmol/L (ref 20.0–28.0)
Bicarbonate: 22.8 mmol/L (ref 20.0–28.0)
Bicarbonate: 23.2 mmol/L (ref 20.0–28.0)
Calcium, Ion: 1.21 mmol/L (ref 1.15–1.40)
Calcium, Ion: 0.86 mmol/L — CL (ref 1.15–1.40)
Calcium, Ion: 1.04 mmol/L — ABNORMAL LOW (ref 1.15–1.40)
Calcium, Ion: 1.06 mmol/L — ABNORMAL LOW (ref 1.15–1.40)
Calcium, Ion: 1.1 mmol/L — ABNORMAL LOW (ref 1.15–1.40)
Calcium, Ion: 1.09 mmol/L — ABNORMAL LOW (ref 1.15–1.40)
HCT: 42 % (ref 39.0–52.0)
HCT: 31 % — ABNORMAL LOW (ref 39.0–52.0)
HCT: 33 % — ABNORMAL LOW (ref 39.0–52.0)
HCT: 36 % — ABNORMAL LOW (ref 39.0–52.0)
HCT: 37 % — ABNORMAL LOW (ref 39.0–52.0)
HCT: 38 % — ABNORMAL LOW (ref 39.0–52.0)
Hemoglobin: 14.3 g/dL (ref 13.0–17.0)
Hemoglobin: 10.5 g/dL — ABNORMAL LOW (ref 13.0–17.0)
Hemoglobin: 11.2 g/dL — ABNORMAL LOW (ref 13.0–17.0)
Hemoglobin: 12.2 g/dL — ABNORMAL LOW (ref 13.0–17.0)
Hemoglobin: 12.6 g/dL — ABNORMAL LOW (ref 13.0–17.0)
Hemoglobin: 12.9 g/dL — ABNORMAL LOW (ref 13.0–17.0)
O2 Saturation: 100 %
O2 Saturation: 100 %
O2 Saturation: 99 %
O2 Saturation: 96 %
O2 Saturation: 94 %
O2 Saturation: 92 %
Patient temperature: 36.2
Patient temperature: 36.6
Patient temperature: 37.2
Potassium: 4.6 mmol/L (ref 3.5–5.1)
Potassium: 5.6 mmol/L — ABNORMAL HIGH (ref 3.5–5.1)
Potassium: 4.5 mmol/L (ref 3.5–5.1)
Potassium: 4.1 mmol/L (ref 3.5–5.1)
Potassium: 4.8 mmol/L (ref 3.5–5.1)
Potassium: 4.6 mmol/L (ref 3.5–5.1)
Sodium: 138 mmol/L (ref 135–145)
Sodium: 136 mmol/L (ref 135–145)
Sodium: 138 mmol/L (ref 135–145)
Sodium: 139 mmol/L (ref 135–145)
Sodium: 138 mmol/L (ref 135–145)
Sodium: 138 mmol/L (ref 135–145)
TCO2: 25 mmol/L (ref 22–32)
TCO2: 20 mmol/L — ABNORMAL LOW (ref 22–32)
TCO2: 24 mmol/L (ref 22–32)
TCO2: 25 mmol/L (ref 22–32)
TCO2: 24 mmol/L (ref 22–32)
TCO2: 25 mmol/L (ref 22–32)
pCO2 arterial: 43.1 mmHg (ref 32–48)
pCO2 arterial: 37.7 mmHg (ref 32–48)
pCO2 arterial: 35.1 mmHg (ref 32–48)
pCO2 arterial: 41.6 mmHg (ref 32–48)
pCO2 arterial: 49.2 mmHg — ABNORMAL HIGH (ref 32–48)
pCO2 arterial: 47.9 mmHg (ref 32–48)
pH, Arterial: 7.343 — ABNORMAL LOW (ref 7.35–7.45)
pH, Arterial: 7.301 — ABNORMAL LOW (ref 7.35–7.45)
pH, Arterial: 7.421 (ref 7.35–7.45)
pH, Arterial: 7.358 (ref 7.35–7.45)
pH, Arterial: 7.272 — ABNORMAL LOW (ref 7.35–7.45)
pH, Arterial: 7.293 — ABNORMAL LOW (ref 7.35–7.45)
pO2, Arterial: 310 mmHg — ABNORMAL HIGH (ref 83–108)
pO2, Arterial: 366 mmHg — ABNORMAL HIGH (ref 83–108)
pO2, Arterial: 157 mmHg — ABNORMAL HIGH (ref 83–108)
pO2, Arterial: 79 mmHg — ABNORMAL LOW (ref 83–108)
pO2, Arterial: 81 mmHg — ABNORMAL LOW (ref 83–108)
pO2, Arterial: 72 mmHg — ABNORMAL LOW (ref 83–108)

## 2024-05-26 LAB — ABO/RH: ABO/RH(D): O NEG

## 2024-05-26 LAB — MAGNESIUM: Magnesium: 3 mg/dL — ABNORMAL HIGH (ref 1.7–2.4)

## 2024-05-26 LAB — PROTIME-INR
INR: 1.4 — ABNORMAL HIGH (ref 0.8–1.2)
Prothrombin Time: 17.5 s — ABNORMAL HIGH (ref 11.4–15.2)

## 2024-05-26 LAB — PLATELET COUNT: Platelets: 193 K/uL (ref 150–400)

## 2024-05-26 LAB — GLUCOSE, CAPILLARY
Glucose-Capillary: 105 mg/dL — ABNORMAL HIGH (ref 70–99)
Glucose-Capillary: 111 mg/dL — ABNORMAL HIGH (ref 70–99)
Glucose-Capillary: 136 mg/dL — ABNORMAL HIGH (ref 70–99)
Glucose-Capillary: 138 mg/dL — ABNORMAL HIGH (ref 70–99)
Glucose-Capillary: 154 mg/dL — ABNORMAL HIGH (ref 70–99)
Glucose-Capillary: 159 mg/dL — ABNORMAL HIGH (ref 70–99)

## 2024-05-26 LAB — HEMOGLOBIN AND HEMATOCRIT, BLOOD
HCT: 34.8 % — ABNORMAL LOW (ref 39.0–52.0)
Hemoglobin: 11.8 g/dL — ABNORMAL LOW (ref 13.0–17.0)

## 2024-05-26 LAB — APTT: aPTT: 30 s (ref 24–36)

## 2024-05-26 SURGERY — CORONARY ARTERY BYPASS GRAFTING (CABG)
Anesthesia: General | Site: Chest

## 2024-05-26 MED ORDER — ASPIRIN 325 MG PO TBEC
325.0000 mg | DELAYED_RELEASE_TABLET | Freq: Every day | ORAL | Status: DC
  Administered 2024-05-27: 325 mg via ORAL
  Filled 2024-05-26: qty 1

## 2024-05-26 MED ORDER — CLEVIDIPINE BUTYRATE 0.5 MG/ML IV EMUL
INTRAVENOUS | Status: AC
  Filled 2024-05-26: qty 50

## 2024-05-26 MED ORDER — SODIUM CHLORIDE 0.9% FLUSH: 3.0000 mL | INTRAVENOUS | Status: DC | PRN

## 2024-05-26 MED ORDER — POTASSIUM CHLORIDE 10 MEQ/50ML IV SOLN: 10.0000 meq | INTRAVENOUS | Status: AC

## 2024-05-26 MED ORDER — ROCURONIUM BROMIDE 10 MG/ML (PF) SYRINGE
PREFILLED_SYRINGE | INTRAVENOUS | Status: DC | PRN
  Administered 2024-05-26 (×2): 50 mg via INTRAVENOUS
  Administered 2024-05-26: 30 mg via INTRAVENOUS
  Administered 2024-05-26: 80 mg via INTRAVENOUS

## 2024-05-26 MED ORDER — CHLORHEXIDINE GLUCONATE 0.12 % MT SOLN
15.0000 mL | OROMUCOSAL | Status: AC
  Administered 2024-05-26: 15 mL via OROMUCOSAL
  Filled 2024-05-26: qty 15

## 2024-05-26 MED ORDER — EPHEDRINE SULFATE-NACL 50-0.9 MG/10ML-% IV SOSY
PREFILLED_SYRINGE | INTRAVENOUS | Status: DC | PRN
  Administered 2024-05-26 (×2): 5 mg via INTRAVENOUS
  Administered 2024-05-26: 2.5 mg via INTRAVENOUS
  Administered 2024-05-26: 5 mg via INTRAVENOUS

## 2024-05-26 MED ORDER — ROCURONIUM BROMIDE 10 MG/ML (PF) SYRINGE
PREFILLED_SYRINGE | INTRAVENOUS | Status: AC
  Filled 2024-05-26: qty 10

## 2024-05-26 MED ORDER — SODIUM CHLORIDE 0.45 % IV SOLN: INTRAVENOUS | Status: DC | PRN

## 2024-05-26 MED ORDER — ACETAMINOPHEN 160 MG/5ML PO SOLN
650.0000 mg | Freq: Once | ORAL | Status: AC
Start: 2024-05-26 — End: 2024-05-26
  Administered 2024-05-26: 650 mg
  Filled 2024-05-26: qty 20.3

## 2024-05-26 MED ORDER — LIDOCAINE 2% (20 MG/ML) 5 ML SYRINGE
INTRAMUSCULAR | Status: DC | PRN
  Administered 2024-05-26: 60 mg via INTRAVENOUS

## 2024-05-26 MED ORDER — TRAMADOL HCL 50 MG PO TABS
50.0000 mg | ORAL_TABLET | ORAL | Status: DC | PRN
  Administered 2024-05-26 – 2024-05-27 (×2): 50 mg via ORAL
  Administered 2024-05-28 – 2024-05-31 (×8): 100 mg via ORAL
  Filled 2024-05-26 (×2): qty 2
  Filled 2024-05-26: qty 1
  Filled 2024-05-26 (×6): qty 2
  Filled 2024-05-26: qty 1

## 2024-05-26 MED ORDER — LACTATED RINGERS IV SOLN: INTRAVENOUS | Status: DC

## 2024-05-26 MED ORDER — SODIUM BICARBONATE 8.4 % IV SOLN
50.0000 meq | Freq: Once | INTRAVENOUS | Status: AC
  Administered 2024-05-26: 50 meq via INTRAVENOUS

## 2024-05-26 MED ORDER — ONDANSETRON HCL 4 MG/2ML IJ SOLN
4.0000 mg | Freq: Four times a day (QID) | INTRAMUSCULAR | Status: DC | PRN
  Administered 2024-05-28 – 2024-06-01 (×2): 4 mg via INTRAVENOUS
  Filled 2024-05-26 (×2): qty 2

## 2024-05-26 MED ORDER — METOPROLOL TARTRATE 5 MG/5ML IV SOLN: 2.5000 mg | INTRAVENOUS | Status: DC | PRN

## 2024-05-26 MED ORDER — PROTAMINE SULFATE 10 MG/ML IV SOLN
INTRAVENOUS | Status: AC
  Filled 2024-05-26: qty 25

## 2024-05-26 MED ORDER — SUGAMMADEX SODIUM 200 MG/2ML IV SOLN
INTRAVENOUS | Status: DC | PRN
  Administered 2024-05-26: 200 mg via INTRAVENOUS

## 2024-05-26 MED ORDER — CHLORHEXIDINE GLUCONATE 0.12 % MT SOLN: 15.0000 mL | Freq: Once | OROMUCOSAL | Status: DC

## 2024-05-26 MED ORDER — PROTAMINE SULFATE 10 MG/ML IV SOLN
INTRAVENOUS | Status: AC
  Filled 2024-05-26: qty 10

## 2024-05-26 MED ORDER — CHLORHEXIDINE GLUCONATE 0.12 % MT SOLN
15.0000 mL | Freq: Once | OROMUCOSAL | Status: AC
  Administered 2024-05-26: 15 mL via OROMUCOSAL

## 2024-05-26 MED ORDER — MORPHINE SULFATE (PF) 2 MG/ML IV SOLN
1.0000 mg | INTRAVENOUS | Status: DC | PRN
  Administered 2024-05-26 – 2024-05-27 (×4): 4 mg via INTRAVENOUS
  Filled 2024-05-26 (×4): qty 2

## 2024-05-26 MED ORDER — METOPROLOL TARTRATE 12.5 MG HALF TABLET: 12.5000 mg | ORAL_TABLET | Freq: Once | ORAL | Status: AC

## 2024-05-26 MED ORDER — LACTATED RINGERS IV SOLN: INTRAVENOUS | Status: DC | PRN

## 2024-05-26 MED ORDER — PHENYLEPHRINE 80 MCG/ML (10ML) SYRINGE FOR IV PUSH (FOR BLOOD PRESSURE SUPPORT)
PREFILLED_SYRINGE | INTRAVENOUS | Status: AC
  Filled 2024-05-26: qty 10

## 2024-05-26 MED ORDER — ASPIRIN 81 MG PO CHEW: 324.0000 mg | CHEWABLE_TABLET | Freq: Every day | ORAL | Status: DC

## 2024-05-26 MED ORDER — FENTANYL CITRATE (PF) 250 MCG/5ML IJ SOLN
INTRAMUSCULAR | Status: AC
  Filled 2024-05-26: qty 5

## 2024-05-26 MED ORDER — ACETAMINOPHEN 160 MG/5ML PO SOLN
1000.0000 mg | Freq: Four times a day (QID) | ORAL | Status: AC
Start: 2024-05-27 — End: 2024-05-31

## 2024-05-26 MED ORDER — PROPOFOL 10 MG/ML IV BOLUS
INTRAVENOUS | Status: AC
  Filled 2024-05-26: qty 20

## 2024-05-26 MED ORDER — MIDAZOLAM HCL (PF) 10 MG/2ML IJ SOLN
INTRAMUSCULAR | Status: AC
  Filled 2024-05-26: qty 2

## 2024-05-26 MED ORDER — ASPIRIN 81 MG PO CHEW
324.0000 mg | CHEWABLE_TABLET | Freq: Once | ORAL | Status: AC
  Administered 2024-05-26: 324 mg via ORAL
  Filled 2024-05-26: qty 4

## 2024-05-26 MED ORDER — ROCURONIUM BROMIDE 10 MG/ML (PF) SYRINGE
PREFILLED_SYRINGE | INTRAVENOUS | Status: AC
Start: 2024-05-26 — End: 2024-05-26
  Filled 2024-05-26: qty 10

## 2024-05-26 MED ORDER — FENTANYL CITRATE (PF) 250 MCG/5ML IJ SOLN
INTRAMUSCULAR | Status: DC | PRN
  Administered 2024-05-26: 100 ug via INTRAVENOUS
  Administered 2024-05-26: 150 ug via INTRAVENOUS
  Administered 2024-05-26: 100 ug via INTRAVENOUS
  Administered 2024-05-26: 200 ug via INTRAVENOUS
  Administered 2024-05-26 (×2): 100 ug via INTRAVENOUS
  Administered 2024-05-26: 50 ug via INTRAVENOUS
  Administered 2024-05-26 (×2): 150 ug via INTRAVENOUS

## 2024-05-26 MED ORDER — HEMOSTATIC AGENTS (NO CHARGE) OPTIME
TOPICAL | Status: DC | PRN
  Administered 2024-05-26: 1 via TOPICAL

## 2024-05-26 MED ORDER — PROTAMINE SULFATE 10 MG/ML IV SOLN
INTRAVENOUS | Status: AC
  Filled 2024-05-26: qty 5

## 2024-05-26 MED ORDER — PROTAMINE SULFATE 10 MG/ML IV SOLN
INTRAVENOUS | Status: DC | PRN
  Administered 2024-05-26: 370 mg via INTRAVENOUS

## 2024-05-26 MED ORDER — DOCUSATE SODIUM 100 MG PO CAPS
200.0000 mg | ORAL_CAPSULE | Freq: Every day | ORAL | Status: DC
  Administered 2024-05-27 – 2024-06-01 (×5): 200 mg via ORAL
  Filled 2024-05-26 (×6): qty 2

## 2024-05-26 MED ORDER — MIDAZOLAM HCL 2 MG/2ML IJ SOLN
2.0000 mg | INTRAMUSCULAR | Status: DC | PRN
  Filled 2024-05-26: qty 2

## 2024-05-26 MED ORDER — PLASMA-LYTE A IV SOLN: INTRAVENOUS | Status: DC | PRN

## 2024-05-26 MED ORDER — SODIUM CHLORIDE 0.9 % IV SOLN: INTRAVENOUS | Status: DC

## 2024-05-26 MED ORDER — INSULIN REGULAR(HUMAN) IN NACL 100-0.9 UT/100ML-% IV SOLN: INTRAVENOUS | Status: DC

## 2024-05-26 MED ORDER — SODIUM CHLORIDE 0.9% FLUSH
3.0000 mL | Freq: Two times a day (BID) | INTRAVENOUS | Status: DC
  Administered 2024-05-27 – 2024-06-01 (×12): 3 mL via INTRAVENOUS

## 2024-05-26 MED ORDER — BISACODYL 10 MG RE SUPP: 10.0000 mg | Freq: Every day | RECTAL | Status: DC

## 2024-05-26 MED ORDER — OXYCODONE HCL 5 MG PO TABS
5.0000 mg | ORAL_TABLET | ORAL | Status: DC | PRN
  Administered 2024-05-26 – 2024-06-02 (×16): 10 mg via ORAL
  Filled 2024-05-26 (×17): qty 2

## 2024-05-26 MED ORDER — HEPARIN SODIUM (PORCINE) 1000 UNIT/ML IJ SOLN
INTRAMUSCULAR | Status: AC
Start: 2024-05-26 — End: 2024-05-26
  Filled 2024-05-26: qty 10

## 2024-05-26 MED ORDER — MIDAZOLAM HCL (PF) 5 MG/ML IJ SOLN
INTRAMUSCULAR | Status: DC | PRN
  Administered 2024-05-26 (×2): 2 mg via INTRAVENOUS
  Administered 2024-05-26: 1 mg via INTRAVENOUS
  Administered 2024-05-26: 2 mg via INTRAVENOUS

## 2024-05-26 MED ORDER — EPINEPHRINE 1 MG/10ML IV SOSY
PREFILLED_SYRINGE | INTRAVENOUS | Status: AC
  Filled 2024-05-26: qty 10

## 2024-05-26 MED ORDER — DEXMEDETOMIDINE HCL IN NACL 400 MCG/100ML IV SOLN: 0.0000 ug/kg/h | INTRAVENOUS | Status: DC

## 2024-05-26 MED ORDER — BISACODYL 5 MG PO TBEC
10.0000 mg | DELAYED_RELEASE_TABLET | Freq: Every day | ORAL | Status: DC
  Administered 2024-05-27 – 2024-06-01 (×5): 10 mg via ORAL
  Filled 2024-05-26 (×5): qty 2

## 2024-05-26 MED ORDER — LACTATED RINGERS IV SOLN
INTRAVENOUS | Status: DC
Start: 2024-05-26 — End: 2024-05-26

## 2024-05-26 MED ORDER — SODIUM CHLORIDE 0.9 % IV SOLN
250.0000 mL | INTRAVENOUS | Status: AC
  Administered 2024-05-27: 250 mL via INTRAVENOUS

## 2024-05-26 MED ORDER — PHENYLEPHRINE 80 MCG/ML (10ML) SYRINGE FOR IV PUSH (FOR BLOOD PRESSURE SUPPORT)
PREFILLED_SYRINGE | INTRAVENOUS | Status: DC | PRN
  Administered 2024-05-26: 160 ug via INTRAVENOUS
  Administered 2024-05-26: 120 ug via INTRAVENOUS
  Administered 2024-05-26: 80 ug via INTRAVENOUS
  Administered 2024-05-26: 40 ug via INTRAVENOUS
  Administered 2024-05-26: 80 ug via INTRAVENOUS
  Administered 2024-05-26: 40 ug via INTRAVENOUS
  Administered 2024-05-26: 80 ug via INTRAVENOUS
  Administered 2024-05-26 (×4): 40 ug via INTRAVENOUS
  Administered 2024-05-26: 80 ug via INTRAVENOUS

## 2024-05-26 MED ORDER — HEPARIN SODIUM (PORCINE) 1000 UNIT/ML IJ SOLN
INTRAMUSCULAR | Status: DC | PRN
  Administered 2024-05-26: 37000 [IU] via INTRAVENOUS

## 2024-05-26 MED ORDER — ACETAMINOPHEN 500 MG PO TABS
1000.0000 mg | ORAL_TABLET | Freq: Four times a day (QID) | ORAL | Status: AC
Start: 2024-05-27 — End: 2024-05-31
  Administered 2024-05-26 – 2024-05-31 (×20): 1000 mg via ORAL
  Filled 2024-05-26 (×20): qty 2

## 2024-05-26 MED ORDER — CLEVIDIPINE BUTYRATE 0.5 MG/ML IV EMUL
INTRAVENOUS | Status: DC | PRN
  Administered 2024-05-26: 2 mg/h via INTRAVENOUS

## 2024-05-26 MED ORDER — METOCLOPRAMIDE HCL 5 MG/ML IJ SOLN
10.0000 mg | Freq: Four times a day (QID) | INTRAMUSCULAR | Status: AC
Start: 2024-05-26 — End: 2024-05-27
  Administered 2024-05-26 – 2024-05-27 (×6): 10 mg via INTRAVENOUS
  Filled 2024-05-26 (×5): qty 2

## 2024-05-26 MED ORDER — SODIUM CHLORIDE 0.9 % IV SOLN: INTRAVENOUS | Status: DC | PRN

## 2024-05-26 MED ORDER — PHENYLEPHRINE HCL-NACL 20-0.9 MG/250ML-% IV SOLN: 0.0000 ug/min | INTRAVENOUS | Status: DC

## 2024-05-26 MED ORDER — METOPROLOL TARTRATE 12.5 MG HALF TABLET: 12.5000 mg | ORAL_TABLET | Freq: Two times a day (BID) | ORAL | Status: DC

## 2024-05-26 MED ORDER — CHLORHEXIDINE GLUCONATE CLOTH 2 % EX PADS
6.0000 | MEDICATED_PAD | Freq: Every day | CUTANEOUS | Status: AC
Start: 2024-05-26 — End: ?
  Administered 2024-05-26 – 2024-06-01 (×7): 6 via TOPICAL

## 2024-05-26 MED ORDER — ORAL CARE MOUTH RINSE: 15.0000 mL | Freq: Once | OROMUCOSAL | Status: DC

## 2024-05-26 MED ORDER — CHLORHEXIDINE GLUCONATE 4 % EX SOLN: 30.0000 mL | CUTANEOUS | Status: DC

## 2024-05-26 MED ORDER — VANCOMYCIN HCL IN DEXTROSE 1-5 GM/200ML-% IV SOLN
1000.0000 mg | Freq: Once | INTRAVENOUS | Status: AC
  Administered 2024-05-26: 1000 mg via INTRAVENOUS
  Filled 2024-05-26: qty 200

## 2024-05-26 MED ORDER — PANTOPRAZOLE SODIUM 40 MG IV SOLR
40.0000 mg | Freq: Every day | INTRAVENOUS | Status: AC
Start: 2024-05-26 — End: 2024-05-28
  Administered 2024-05-26 – 2024-05-27 (×2): 40 mg via INTRAVENOUS
  Filled 2024-05-26 (×2): qty 10

## 2024-05-26 MED ORDER — SODIUM CHLORIDE (PF) 0.9 % IJ SOLN
INTRAMUSCULAR | Status: AC
Start: 2024-05-26 — End: 2024-05-26
  Filled 2024-05-26: qty 10

## 2024-05-26 MED ORDER — ~~LOC~~ CARDIAC SURGERY, PATIENT & FAMILY EDUCATION
Freq: Once | Status: DC
  Filled 2024-05-26: qty 1

## 2024-05-26 MED ORDER — METOPROLOL TARTRATE 12.5 MG HALF TABLET
ORAL_TABLET | ORAL | Status: AC
  Administered 2024-05-26: 12.5 mg via ORAL
  Filled 2024-05-26: qty 1

## 2024-05-26 MED ORDER — CEFAZOLIN SODIUM-DEXTROSE 2-4 GM/100ML-% IV SOLN
2.0000 g | Freq: Three times a day (TID) | INTRAVENOUS | Status: AC
Start: 2024-05-26 — End: 2024-05-28
  Administered 2024-05-26 – 2024-05-28 (×6): 2 g via INTRAVENOUS
  Filled 2024-05-26 (×6): qty 100

## 2024-05-26 MED ORDER — THROMBIN (RECOMBINANT) 20000 UNITS EX SOLR
CUTANEOUS | Status: AC
  Filled 2024-05-26: qty 20000

## 2024-05-26 MED ORDER — THROMBIN 20000 UNITS EX SOLR: OROMUCOSAL | Status: DC | PRN

## 2024-05-26 MED ORDER — ALBUMIN HUMAN 5 % IV SOLN: INTRAVENOUS | Status: DC | PRN

## 2024-05-26 MED ORDER — ALBUMIN HUMAN 5 % IV SOLN: 250.0000 mL | INTRAVENOUS | Status: DC | PRN

## 2024-05-26 MED ORDER — METOPROLOL TARTRATE 25 MG/10 ML ORAL SUSPENSION: 12.5000 mg | Freq: Two times a day (BID) | ORAL | Status: DC

## 2024-05-26 MED ORDER — LIDOCAINE 2% (20 MG/ML) 5 ML SYRINGE
INTRAMUSCULAR | Status: AC
Start: 2024-05-26 — End: 2024-05-26
  Filled 2024-05-26: qty 5

## 2024-05-26 MED ORDER — KETOROLAC TROMETHAMINE 15 MG/ML IJ SOLN
15.0000 mg | Freq: Four times a day (QID) | INTRAMUSCULAR | Status: DC | PRN
  Administered 2024-05-26 – 2024-05-27 (×4): 15 mg via INTRAVENOUS
  Filled 2024-05-26 (×4): qty 1

## 2024-05-26 MED ORDER — PROPOFOL 10 MG/ML IV BOLUS
INTRAVENOUS | Status: DC | PRN
  Administered 2024-05-26: 30 mg via INTRAVENOUS
  Administered 2024-05-26: 40 mg via INTRAVENOUS
  Administered 2024-05-26: 30 mg via INTRAVENOUS

## 2024-05-26 MED ORDER — HEPARIN SODIUM (PORCINE) 1000 UNIT/ML IJ SOLN
INTRAMUSCULAR | Status: AC
  Filled 2024-05-26: qty 1

## 2024-05-26 MED ORDER — CHLORHEXIDINE GLUCONATE 0.12 % MT SOLN
OROMUCOSAL | Status: AC
  Administered 2024-05-26: 15 mL via OROMUCOSAL
  Filled 2024-05-26: qty 15

## 2024-05-26 MED ORDER — DEXTROSE 50 % IV SOLN: 0.0000 mL | INTRAVENOUS | Status: DC | PRN

## 2024-05-26 MED ORDER — 0.9 % SODIUM CHLORIDE (POUR BTL) OPTIME
TOPICAL | Status: DC | PRN
  Administered 2024-05-26: 5000 mL

## 2024-05-26 MED ORDER — MAGNESIUM SULFATE 4 GM/100ML IV SOLN
4.0000 g | Freq: Once | INTRAVENOUS | Status: AC
Start: 2024-05-26 — End: 2024-05-26
  Administered 2024-05-26: 4 g via INTRAVENOUS
  Filled 2024-05-26: qty 100

## 2024-05-26 MED ORDER — NITROGLYCERIN IN D5W 200-5 MCG/ML-% IV SOLN: 0.0000 ug/min | INTRAVENOUS | Status: DC

## 2024-05-26 MED ORDER — EPHEDRINE 5 MG/ML INJ
INTRAVENOUS | Status: AC
Start: 2024-05-26 — End: 2024-05-26
  Filled 2024-05-26: qty 5

## 2024-05-26 MED ORDER — PANTOPRAZOLE SODIUM 40 MG PO TBEC
40.0000 mg | DELAYED_RELEASE_TABLET | Freq: Every day | ORAL | Status: DC
  Administered 2024-05-28 – 2024-06-02 (×6): 40 mg via ORAL
  Filled 2024-05-26 (×6): qty 1

## 2024-05-26 MED ORDER — EPHEDRINE 5 MG/ML INJ
INTRAVENOUS | Status: AC
  Filled 2024-05-26: qty 5

## 2024-05-26 MED ORDER — NITROGLYCERIN 0.2 MG/ML ON CALL CATH LAB
INTRAVENOUS | Status: DC | PRN
Start: 2024-05-26 — End: 2024-05-26
  Administered 2024-05-26: 80 ug via INTRAVENOUS

## 2024-05-26 SURGICAL SUPPLY — 78 items
BAG DECANTER FOR FLEXI CONT (MISCELLANEOUS) ×2 IMPLANT
BLADE CLIPPER SURG (BLADE) ×2 IMPLANT
BLADE STERNUM SYSTEM 6 (BLADE) ×2 IMPLANT
BLADE SURG 11 STRL SS (BLADE) IMPLANT
BNDG ELASTIC 4INX 5YD STR LF (GAUZE/BANDAGES/DRESSINGS) IMPLANT
BNDG ELASTIC 6INX 5YD STR LF (GAUZE/BANDAGES/DRESSINGS) ×2 IMPLANT
BNDG GAUZE DERMACEA FLUFF 4 (GAUZE/BANDAGES/DRESSINGS) ×2 IMPLANT
CANISTER SUCTION 3000ML PPV (SUCTIONS) ×2 IMPLANT
CANNULA AORTIC ROOT 9FR (CANNULA) ×2 IMPLANT
CANNULA ARTERIAL NVNT 3/8 20FR (MISCELLANEOUS) IMPLANT
CANNULA MC2 2 STG 36/46 NON-V (CANNULA) IMPLANT
CATH ROBINSON RED A/P 18FR (CATHETERS) ×4 IMPLANT
CATH THORACIC 28FR (CATHETERS) ×2 IMPLANT
CATH THORACIC 36FR (CATHETERS) ×2 IMPLANT
CATH THORACIC 36FR RT ANG (CATHETERS) ×2 IMPLANT
CLIP TI WIDE RED SMALL 24 (CLIP) IMPLANT
CONTAINER PROTECT SURGISLUSH (MISCELLANEOUS) ×4 IMPLANT
DERMABOND ADVANCED .7 DNX12 (GAUZE/BANDAGES/DRESSINGS) IMPLANT
DRAPE SRG 135X102X78XABS (DRAPES) ×2 IMPLANT
DRAPE WARM FLUID 44X44 (DRAPES) ×2 IMPLANT
DRSG COVADERM 4X14 (GAUZE/BANDAGES/DRESSINGS) ×2 IMPLANT
ELECT CAUTERY BLADE 6.4 (BLADE) ×2 IMPLANT
ELECTRODE BLDE 4.0 EZ CLN MEGD (MISCELLANEOUS) ×2 IMPLANT
ELECTRODE REM PT RTRN 9FT ADLT (ELECTROSURGICAL) ×4 IMPLANT
FELT TEFLON 1X6 (MISCELLANEOUS) ×2 IMPLANT
GAUZE SPONGE 4X4 12PLY STRL (GAUZE/BANDAGES/DRESSINGS) ×4 IMPLANT
GLOVE BIO SURGEON STRL SZ 6 (GLOVE) IMPLANT
GLOVE BIO SURGEON STRL SZ 6.5 (GLOVE) IMPLANT
GLOVE BIOGEL PI IND STRL 6.5 (GLOVE) IMPLANT
GLOVE BIOGEL PI IND STRL 7.0 (GLOVE) IMPLANT
GLOVE ECLIPSE 7.0 STRL STRAW (GLOVE) ×4 IMPLANT
GLOVE ORTHO TXT STRL SZ7.5 (GLOVE) IMPLANT
GOWN STRL REUS W/ TWL LRG LVL3 (GOWN DISPOSABLE) ×8 IMPLANT
GOWN STRL REUS W/ TWL XL LVL3 (GOWN DISPOSABLE) ×2 IMPLANT
HEMOSTAT POWDER SURGIFOAM 1G (HEMOSTASIS) ×6 IMPLANT
HEMOSTAT SURGICEL 2X14 (HEMOSTASIS) ×2 IMPLANT
KIT BASIN OR (CUSTOM PROCEDURE TRAY) ×2 IMPLANT
KIT SUCTION CATH 14FR (SUCTIONS) ×2 IMPLANT
KIT TURNOVER KIT B (KITS) ×2 IMPLANT
KIT VASOVIEW HEMOPRO 2 VH 4000 (KITS) ×2 IMPLANT
KNIFE MICRO-UNI 3.5 30 DEG (BLADE) ×2 IMPLANT
NS IRRIG 1000ML POUR BTL (IV SOLUTION) ×10 IMPLANT
PACK E OPEN HEART (SUTURE) ×2 IMPLANT
PACK OPEN HEART (CUSTOM PROCEDURE TRAY) ×2 IMPLANT
PAD ARMBOARD POSITIONER FOAM (MISCELLANEOUS) ×4 IMPLANT
PAD ELECT DEFIB RADIOL ZOLL (MISCELLANEOUS) ×2 IMPLANT
PENCIL BUTTON HOLSTER BLD 10FT (ELECTRODE) ×2 IMPLANT
POSITIONER HEAD DONUT 9IN (MISCELLANEOUS) ×2 IMPLANT
PUNCH AORTIC ROTATE 4.5MM 8IN (MISCELLANEOUS) ×2 IMPLANT
SEALANT SURG COSEAL 8ML (VASCULAR PRODUCTS) IMPLANT
SET MPS 3-ND DEL (MISCELLANEOUS) IMPLANT
SUPPORT HEART JANKE-BARRON (MISCELLANEOUS) ×2 IMPLANT
SUT BONE WAX W31G (SUTURE) ×2 IMPLANT
SUT MNCRL AB 4-0 PS2 18 (SUTURE) IMPLANT
SUT PROLENE 3 0 SH1 36 (SUTURE) ×2 IMPLANT
SUT PROLENE 4 0 SH DA (SUTURE) IMPLANT
SUT PROLENE 4-0 RB1 .5 CRCL 36 (SUTURE) IMPLANT
SUT PROLENE 5 0 C 1 36 (SUTURE) IMPLANT
SUT PROLENE 6 0 C 1 30 (SUTURE) IMPLANT
SUT PROLENE 7 0 BV 1 (SUTURE) IMPLANT
SUT PROLENE 7 0 BV1 MDA (SUTURE) ×2 IMPLANT
SUT PROLENE 8 0 BV175 6 (SUTURE) IMPLANT
SUT SILK 1 MH (SUTURE) IMPLANT
SUT STEEL STERNAL CCS#1 18IN (SUTURE) IMPLANT
SUT STEEL SZ 6 DBL 3X14 BALL (SUTURE) IMPLANT
SUT VIC AB 1 CTX36XBRD ANBCTR (SUTURE) ×4 IMPLANT
SUT VIC AB 2-0 CT1 TAPERPNT 27 (SUTURE) IMPLANT
SYSTEM SAHARA CHEST DRAIN ATS (WOUND CARE) ×2 IMPLANT
TAPE CLOTH SURG 4X10 WHT LF (GAUZE/BANDAGES/DRESSINGS) IMPLANT
TAPE PAPER 2X10 WHT MICROPORE (GAUZE/BANDAGES/DRESSINGS) IMPLANT
TOWEL GREEN STERILE (TOWEL DISPOSABLE) ×2 IMPLANT
TOWEL GREEN STERILE FF (TOWEL DISPOSABLE) ×2 IMPLANT
TRAY FOLEY SLVR 16FR TEMP STAT (SET/KITS/TRAYS/PACK) ×2 IMPLANT
TUBE SUCT INTRACARD DLP 20F (MISCELLANEOUS) ×2 IMPLANT
TUBE SUCTION CARDIAC 10FR (CANNULA) ×2 IMPLANT
TUBING LAP HI FLOW INSUFFLATIO (TUBING) ×2 IMPLANT
UNDERPAD 30X36 HEAVY ABSORB (UNDERPADS AND DIAPERS) ×2 IMPLANT
WATER STERILE IRR 1000ML POUR (IV SOLUTION) ×4 IMPLANT

## 2024-05-26 NOTE — Interval H&P Note (Signed)
 History and Physical Interval Note:  05/26/2024 6:26 AM  Marc Myers  has presented today for surgery, with the diagnosis of CAD.  The various methods of treatment have been discussed with the patient and family. After consideration of risks, benefits and other options for treatment, the patient has consented to  Procedure(s): CORONARY ARTERY BYPASS GRAFTING (CABG) (N/A) ECHOCARDIOGRAM, TRANSESOPHAGEAL, INTRAOPERATIVE (N/A) as a surgical intervention.  The patient's history has been reviewed, patient examined, no change in status, stable for surgery.  I have reviewed the patient's chart and labs.  Questions were answered to the patient's satisfaction.     Terri Malerba K [name]

## 2024-05-26 NOTE — Op Note (Signed)
 CARDIOVASCULAR SURGERY OPERATIVE NOTE  05/26/2024  Surgeon:  Dorise LOIS Fellers, MD  First Assistant: Con Bend,  PA-C:   An experienced assistant was required given the complexity of this surgery and the standard of surgical care. The assistant was needed for endoscopic vein harvest, exposure, dissection, suctioning, retraction of delicate tissues and sutures, instrument exchange and for overall help during this procedure.   Preoperative Diagnosis:  Severe multi-vessel coronary artery disease   Postoperative Diagnosis:  Same   Procedure:  Median Sternotomy Extracorporeal circulation 3.   Coronary artery bypass grafting x 4  Saphenous vein graft to the LAD Saphenous vein graft to the diagonal 1 Saphenous vein graft to the OM 1 Saphenous vein graft to the PDA  4.   Endoscopic vein harvest from the right leg   Anesthesia:  General Endotracheal   Clinical History/Surgical Indication:  This 68 year old gentleman has severe three-vessel coronary disease with a long segment 80 to 90% mid LAD stenosis compromising a moderate-sized LAD and diagonal branch. The obtuse marginal is occluded with filling via collaterals and is a moderate-sized vessel. The RCA is occluded in its midportion with filling of the distal vessel by collaterals. I agree that coronary artery bypass graft surgery is the best treatment for resolution of his symptoms and to prevent left ventricular dysfunction. I reviewed the cardiac catheterization images with the patient and his wife and answered their questions. I discussed the operative procedure with the patient and family including alternatives, benefits and risks; including but not limited to bleeding, blood transfusion, infection, stroke, myocardial infarction, graft failure, heart block requiring a permanent pacemaker, organ dysfunction, and death. Marc Myers  understands and agrees to proceed.   Preparation:  The patient was seen in the preoperative holding area and the correct patient, correct operation were confirmed with the patient after reviewing the medical record and catheterization. The consent was signed by me. Preoperative antibiotics were given. A pulmonary arterial line and radial arterial line were placed by the anesthesia team. The patient was taken back to the operating room and positioned supine on the operating room table. After being placed under general endotracheal anesthesia by the anesthesia team a foley catheter was placed. The neck, chest, abdomen, and both legs were prepped with betadine soap and solution and draped in the usual sterile manner. A surgical time-out was taken and the correct patient and operative procedure were confirmed with the nursing and anesthesia staff.   Cardiopulmonary Bypass:  A median sternotomy was performed. The pericardium was opened in the midline. Right ventricular function appeared normal. The ascending aorta was of normal size and had no palpable plaque. There were no contraindications to aortic cannulation or cross-clamping. The patient was fully systemically heparinized and the ACT was maintained > 400 sec. The proximal aortic arch was cannulated with a 20 F aortic cannula for arterial inflow. Venous cannulation was performed via the right atrial appendage using a two-staged venous cannula. An antegrade cardioplegia/vent cannula was inserted into the mid-ascending aorta. Aortic occlusion was performed with a single cross-clamp. Systemic cooling to 32 degrees Centigrade and topical cooling of the heart with iced saline were used. Hyperkalemic antegrade cold blood cardioplegia was used to induce diastolic arrest and was then given at about 20 minute intervals throughout the period of arrest to maintain myocardial temperature at or below 10 degrees centigrade. A temperature probe was inserted into the  interventricular septum and an insulating pad was placed in the pericardium.   Left internal mammary artery harvest:  The left side of the sternum was retracted using the Rultract retractor. The left internal mammary artery was harvested as a pedicle graft. All side branches were clipped. It was a small-sized vessel of fair quality with sub-optimal blood flow.  It was sprayed with topical papaverine solution to prevent vasospasm but that did not improve the size or blood flow. I did not feel that it would be adequate to supply the LAD and the saphenous vein was good quality so I decided not to use the LIMA. It was divided proximally and the stump suture ligated with 2-0 silk. The vessel was small proximally.    Endoscopic vein harvest:  The right greater saphenous vein was harvested endoscopically through a 2 cm incision medial to the right knee. It was harvested from the upper thigh to below the knee. It was a medium-sized vein of good quality. The side branches were all ligated with 4-0 silk ties.    Coronary arteries:  The coronary arteries were examined.  LAD:  intramyocardial throughout until near the apex where it was very small. I was able to locate it in the muscle in the mid portion just beyond the second diagonal branch where it was a medium caliber vessel with no disease. The 1st diagonal was a medium caliber vessel with no distal disease.  LCX:  The OM1 was diffusely diseased but graftable distally. RCA:  Diffusely diseased. The PDA was a medium caliber vessel that was diffusely diseased but graftable.   Grafts:  SVG to the LAD: 1.75 mm. It was sewn end to side using 7-0 prolene continuous suture. SVG to D1:  1.6 mm. It was sewn end to side using 7-0 prolene continuous suture. SVG to OM1:  1.75 mm. It was sewn end to side using 7-0 prolene continuous suture. SVG to PDA:  1.6 mm. It was sewn end to side using 7-0 prolene continuous suture.  The proximal vein graft anastomoses  were performed to the mid-ascending aorta using continuous 6-0 prolene suture. Graft markers were placed around the proximal anastomoses.   Completion:  The patient was rewarmed to 37 degrees Centigrade. The clamp was removed from the LIMA pedicle and there was rapid warming of the septum and return of ventricular fibrillation. The crossclamp was removed with a time of 95 minutes. There was spontaneous return of sinus rhythm. The distal and proximal anastomoses were checked for hemostasis. The position of the grafts was satisfactory. Two temporary epicardial pacing wires were placed on the right atrium and two on the right ventricle. The patient was weaned from CPB without difficulty on no inotropes. CPB time was 114 minutes. TEE showed normal LV systolic function.  Heparin  was fully reversed with protamine  and the aortic and venous cannulas removed. Hemostasis was achieved. Mediastinal and bilateral pleural drainage tubes were placed. The sternum was closed with double #6 stainless steel wires. The fascia was closed with continuous # 1 vicryl suture. The subcutaneous tissue was closed with 2-0 vicryl continuous suture. The skin was closed with 3-0 vicryl subcuticular suture. All sponge, needle, and instrument counts were reported correct at the end of the case. Dry sterile dressings were placed over the incisions and around the chest tubes which were connected to pleurevac suction. The patient was then transported to the surgical intensive care unit in stable condition.

## 2024-05-26 NOTE — Discharge Instructions (Signed)

## 2024-05-26 NOTE — Anesthesia Procedure Notes (Addendum)
 Arterial Line Insertion Start/End9/23/2025 6:55 AM, 05/26/2024 7:00 AM Performed by: Claudene Arlin LABOR, CRNA, CRNA  Patient location: Pre-op. Preanesthetic checklist: patient identified, IV checked, site marked, risks and benefits discussed, surgical consent, monitors and equipment checked, pre-op evaluation, timeout performed and anesthesia consent Lidocaine  1% used for infiltration Left, radial was placed Catheter size: 20 G Hand hygiene performed  and maximum sterile barriers used  Allen's test indicative of satisfactory collateral circulation Attempts: 2 Procedure performed without using ultrasound guided technique. Following insertion, dressing applied and Biopatch. Post procedure assessment: normal and unchanged  Patient tolerated the procedure well with no immediate complications.

## 2024-05-26 NOTE — Hospital Course (Addendum)
 HPI:   The patient is a 68 year old gentleman with a history of hypertension and hyperlipidemia who had been very active exercising frequently and was evaluated by cardiology in [place] on 01/23/2023 for dizziness that developed while he was working out walking on a treadmill.  His troponins were negative and ECG was normal.  He had no history of chest discomfort or shortness of breath or fatigue and had been going to the gym 5 to 6 days/week and walking several miles on an incline treadmill.  It was felt that he was likely dehydrated at the time that he developed this dizziness.  He then established care with Dr. Gollan that was seen on 07/01/2023 for evaluation of dizziness.  He had a CTA of the chest on 01/23/2024 which was unremarkable.  A CT of the head showed no acute intracranial findings.  His PCP, Dr. Marylynn, got an echocardiogram on 05/06/2024 which showed a left ventricular ejection fraction of 55 to 60% with grade 1 diastolic dysfunction.  There is mild MR.  There is mild AI with a normal trileaflet aortic valve.  He subsequently had a CT coronary morphology study on 05/14/2024 which showed a coronary calcium  score 433 which was 79th percentile.  There was severe mixed plaque in the proximal and mid LAD.  There is moderate soft plaque stenosis in the mid first marginal branch.  The mid RCA was occluded.  CT FFR evaluation showed evidence of significant functional stenosis in the LAD and RCA with an FFR of 0.51 in the LAD.  He underwent cardiac catheterization on 05/19/2024 which showed severe three-vessel coronary disease with 80 to 90% long segment stenosis in the mid LAD and moderate diffuse disease in the left main.  There is chronic total occlusion of the OM1 and distal RCA filling by collaterals.   He lives with his wife.  He has not been going back to the gym and has not been exerting himself at all since his diagnosis.  He does report some sensation in his chest that he says is greater awareness  associated with some shortness of breath usually with exertion.  He feels like the anxiety of his diagnosis is contributing.  Dr. Lucas reviewed the patient's diagnostic studies and determined he would benefit form surgical intervention. He reviewed the patient's treatment options as well as the risks and benefits of surgery with the patient. Marc Myers was agreeable to proceed with surgery.  Hospital Course:  Marc Myers presented to [facility] and was brought to the operating room on 05/26/24. He underwent CABG x 4 utilizing SVG to LAD, SVG to PDA, SVG to OM and SVG to Diagonal as well as endoscopic harvest of the right greater saphenous vein. He tolerated the procedure well and was transferred to the SICU in stable condition.  The patient was extubated the evening of surgery.  The patient complained of anxiety and requested medication to assist with management.  He was in NSR, however his blood pressure was soft running around 100.  He was kept on AAI pacing to assist and Beta Blocker therapy was not initiated.  His pacing wires, chest tubes, and arterial lines were removed without difficulty.  He was started on Lasix  to help facilitate diuresis. He developed a small left apical pneumothorax after chest tube removal, this was monitored closely. He required 15L HFNC, this was weaned as tolerated. CXR 09/26 showed complete collapse of the left lung, left sided chest tube was placed and set to suction. Follow up  CXR showed complete reexpansion of the left lung.  His oxygen requirement decreased and he was weaned to room air as tolerated.  He developed Atrial Fibrillation with RVR and chemically converted to NSR with IV Amiodarone.  His chest tube was transitioned to water  seal.  Chest xray showed no recurrence of pneumothorax and his chest tube was removed without difficulty on 05/31/2024. Follow up chest xray showed a small left pleural effusion that was stable, stable bibasilar atelectasis and a  tiny left apical pneumothorax. He was experiencing some mild constipation and his bowel regimen was adjusted.  He remained in NSR and was transitioned to oral amiodarone on 05/31/2024.  He was also felt to be stable for transfer to the progressive care unit on post-op day 4.  He remained in stable sinus rhythm on oral amiodarone, Lopressor  was titrated for further blood pressure and heart rate control. He was hypertensive and started on low dose Cozaar . He progressed to independent mobility and had return of appropriate bowel and bladder function. His incisions were healing with no sign of complication. He was ambulating well on room air. He was felt stable for discharge.

## 2024-05-26 NOTE — Transfer of Care (Signed)
 Immediate Anesthesia Transfer of Care Note  Patient: Marc Myers  Procedure(s) Performed: CORONARY ARTERY BYPASS GRAFTING (CABG) X 4, USING ENDOSCOPIC HARVESTED RIGHT GREATER SAPHENOUS VEIN (Chest) ECHOCARDIOGRAM, TRANSESOPHAGEAL, INTRAOPERATIVE  Patient Location: ICU  Anesthesia Type:General  Level of Consciousness: sedated and Patient remains intubated per anesthesia plan  Airway & Oxygen Therapy: Patient remains intubated per anesthesia plan and Patient placed on Ventilator (see vital sign flow sheet for setting)  Post-op Assessment: Report given to RN and Post -op Vital signs reviewed and stable  Post vital signs: Reviewed and stable  Last Vitals:  Vitals Value Taken Time  BP 94/70 05/26/24 13:42  Temp    Pulse 80 05/26/24 13:45  Resp 16 05/26/24 13:45  SpO2 95 % 05/26/24 13:45  Vitals shown include unfiled device data.  Last Pain:  Vitals:   05/26/24 0617  TempSrc:   PainSc: 0-No pain         Complications: There were no known notable events for this encounter.

## 2024-05-26 NOTE — Plan of Care (Signed)
  Problem: Education: Goal: Knowledge of General Education information will improve Description: Including pain rating scale, medication(s)/side effects and non-pharmacologic comfort measures Outcome: Progressing   Problem: Health Behavior/Discharge Planning: Goal: Ability to manage health-related needs will improve Outcome: Progressing   Problem: Clinical Measurements: Goal: Ability to maintain clinical measurements within normal limits will improve Outcome: Progressing Goal: Will remain free from infection Outcome: Progressing Goal: Diagnostic test results will improve Outcome: Progressing Goal: Respiratory complications will improve Outcome: Progressing Goal: Cardiovascular complication will be avoided Outcome: Progressing   Problem: Activity: Goal: Risk for activity intolerance will decrease Outcome: Progressing   Problem: Nutrition: Goal: Adequate nutrition will be maintained Outcome: Progressing   Problem: Elimination: Goal: Will not experience complications related to urinary retention Outcome: Progressing   Problem: Pain Managment: Goal: General experience of comfort will improve and/or be controlled Outcome: Progressing   Problem: Safety: Goal: Ability to remain free from injury will improve Outcome: Progressing   Problem: Skin Integrity: Goal: Risk for impaired skin integrity will decrease Outcome: Progressing

## 2024-05-26 NOTE — Progress Notes (Signed)
   [address], [place] [address]             [phone]    S/p CABG x 4  BP 108/86   Pulse 80   Temp 98.2 F (36.8 C)   Resp 19   Ht 5' 9 (1.753 m)   Wt 93.9 kg   SpO2 91%   BMI 30.57 kg/m   4L [place] 91% sat   Intake/Output Summary (Last 24 hours) at 05/26/2024 1724 Last data filed at 05/26/2024 1600 Gross per 24 hour  Intake 3509.26 ml  Output 1665 ml  Net 1844.26 ml   CT output minimal  Hct 36, K 4.4  Doing well early postop  Elspeth C. Kerrin, MD [facility] [phone]

## 2024-05-26 NOTE — Anesthesia Preprocedure Evaluation (Signed)
 Anesthesia Evaluation  Patient identified by MRN, date of birth, ID band Patient awake    Reviewed: Allergy & Precautions, NPO status , Patient's Chart, lab work & pertinent test results  History of Anesthesia Complications Negative for: history of anesthetic complications  Airway Mallampati: II  TM Distance: >3 FB Neck ROM: Full    Dental  (+) Dental Advisory Given   Pulmonary shortness of breath, neg sleep apnea, neg COPD, neg recent URI, former smoker   breath sounds clear to auscultation       Cardiovascular hypertension, + angina  + CAD   Rhythm:Regular     Neuro/Psych  PSYCHIATRIC DISORDERS Anxiety     negative neurological ROS     GI/Hepatic negative GI ROS, Neg liver ROS,,,  Endo/Other  diabetes    Renal/GU Renal disease     Musculoskeletal   Abdominal   Peds  Hematology   Anesthesia Other Findings   Reproductive/Obstetrics                              Anesthesia Physical Anesthesia Plan  ASA: 4  Anesthesia Plan: General   Post-op Pain Management:    Induction: Intravenous  PONV Risk Score and Plan: 2 and Ondansetron   Airway Management Planned: Oral ETT  Additional Equipment: Arterial line, CVP, TEE and Ultrasound Guidance Line Placement  Intra-op Plan:   Post-operative Plan: Post-operative intubation/ventilation  Informed Consent: I have reviewed the patients History and Physical, chart, labs and discussed the procedure including the risks, benefits and alternatives for the proposed anesthesia with the patient or authorized representative who has indicated his/her understanding and acceptance.     Dental advisory given  Plan Discussed with: CRNA  Anesthesia Plan Comments:          Anesthesia Quick Evaluation

## 2024-05-26 NOTE — Brief Op Note (Signed)
 05/26/2024  1:05 PM  PATIENT:  Marc Myers  68 y.o. male  PRE-OPERATIVE DIAGNOSIS:  CORONARY ARTERY DISEASE  POST-OPERATIVE DIAGNOSIS:  CORONARY ARTERY DISEASE  PROCEDURE:  CORONARY ARTERY BYPASS GRAFTING (CABG) X 4, USING ENDOSCOPIC HARVESTED RIGHT GREATER SAPHENOUS VEIN ECHOCARDIOGRAM, TRANSESOPHAGEAL, INTRAOPERATIVE  Vein harvest time: Vein prep time: -SVG to LAD -SVG to PDA -SVG to OM -SVG to Diagonal  SURGEON:  Surgeons and Role:    * Lucas Dorise POUR, MD - Primary  PHYSICIAN ASSISTANT: Con Bend PA-C  ASSISTANTS: Evalene Domino RNFA   ANESTHESIA:   general  EBL:  500 mL   BLOOD ADMINISTERED: per perfusion records  DRAINS: Mediastinal and pleural drains   LOCAL MEDICATIONS USED:  NONE  SPECIMEN:  No Specimen  DISPOSITION OF SPECIMEN:  N/A  COUNTS:  YES  TOURNIQUET:  * No tourniquets in log *  DICTATION: .Dragon Dictation  PLAN OF CARE: Admit to inpatient   PATIENT DISPOSITION:  ICU - intubated and hemodynamically stable.   Delay start of Pharmacological VTE agent (>24hrs) due to surgical blood loss or risk of bleeding: yes

## 2024-05-26 NOTE — Anesthesia Procedure Notes (Addendum)
 Procedure Name: Intubation Date/Time: 05/26/2024 7:45 AM  Performed by: Claudene Arlin LABOR, CRNAPre-anesthesia Checklist: Patient identified, Emergency Drugs available, Suction available and Patient being monitored Patient Re-evaluated:Patient Re-evaluated prior to induction Oxygen Delivery Method: Circle System Utilized Preoxygenation: Pre-oxygenation with 100% oxygen Induction Type: IV induction Ventilation: Mask ventilation without difficulty Laryngoscope Size: Mac and 4 Grade View: Grade I Tube type: Oral Tube size: 8.0 mm Number of attempts: 1 Airway Equipment and Method: Stylet and Oral airway Placement Confirmation: ETT inserted through vocal cords under direct vision, positive ETCO2 and breath sounds checked- equal and bilateral Secured at: 22 cm Tube secured with: Tape Dental Injury: Teeth and Oropharynx as per pre-operative assessment  Comments: Performed by [place], NORVA

## 2024-05-26 NOTE — Procedures (Signed)
 Extubation Procedure Note  Patient Details:   Name: Ekin Pilar DOB: April 29, 1956 MRN: [id number]   Airway Documentation:    Vent end date: 05/26/24 Vent end time: 1700   Evaluation  O2 sats: stable throughout Complications: No apparent complications Patient did tolerate procedure well. Bilateral Breath Sounds: Clear, Diminished   Yes   Patient was extubated to 3L [place], positive cuff leak, no evidence of stridor, NIF -22 VC , performed with good effort. No complications noted  Damien JULIANNA Pae 05/26/2024, 5:05 PM

## 2024-05-27 ENCOUNTER — Encounter (HOSPITAL_COMMUNITY): Payer: Self-pay | Admitting: Surgery

## 2024-05-27 ENCOUNTER — Inpatient Hospital Stay (HOSPITAL_COMMUNITY)

## 2024-05-27 LAB — BASIC METABOLIC PANEL WITH GFR
Anion gap: 6 (ref 5–15)
Anion gap: 8 (ref 5–15)
BUN: 27 mg/dL — ABNORMAL HIGH (ref 8–23)
BUN: 30 mg/dL — ABNORMAL HIGH (ref 8–23)
CO2: 24 mmol/L (ref 22–32)
CO2: 25 mmol/L (ref 22–32)
Calcium: 7.3 mg/dL — ABNORMAL LOW (ref 8.9–10.3)
Calcium: 7.7 mg/dL — ABNORMAL LOW (ref 8.9–10.3)
Chloride: 105 mmol/L (ref 98–111)
Chloride: 102 mmol/L (ref 98–111)
Creatinine, Ser: 1.07 mg/dL (ref 0.61–1.24)
Creatinine, Ser: 1.47 mg/dL — ABNORMAL HIGH (ref 0.61–1.24)
GFR, Estimated: >60 mL/min (ref >60)
GFR, Estimated: 52 mL/min — ABNORMAL LOW (ref >60)
Glucose, Bld: 109 mg/dL — ABNORMAL HIGH (ref 70–99)
Glucose, Bld: 128 mg/dL — ABNORMAL HIGH (ref 70–99)
Potassium: 4.4 mmol/L (ref 3.5–5.1)
Potassium: 4.7 mmol/L (ref 3.5–5.1)
Sodium: 135 mmol/L (ref 135–145)
Sodium: 135 mmol/L (ref 135–145)

## 2024-05-27 LAB — MAGNESIUM
Magnesium: 2.8 mg/dL — ABNORMAL HIGH (ref 1.7–2.4)
Magnesium: 2.9 mg/dL — ABNORMAL HIGH (ref 1.7–2.4)

## 2024-05-27 LAB — GLUCOSE, CAPILLARY
Glucose-Capillary: 123 mg/dL — ABNORMAL HIGH (ref 70–99)
Glucose-Capillary: 123 mg/dL — ABNORMAL HIGH (ref 70–99)
Glucose-Capillary: 128 mg/dL — ABNORMAL HIGH (ref 70–99)
Glucose-Capillary: 130 mg/dL — ABNORMAL HIGH (ref 70–99)
Glucose-Capillary: 148 mg/dL — ABNORMAL HIGH (ref 70–99)
Glucose-Capillary: 93 mg/dL (ref 70–99)
Glucose-Capillary: 120 mg/dL — ABNORMAL HIGH (ref 70–99)
Glucose-Capillary: 129 mg/dL — ABNORMAL HIGH (ref 70–99)
Glucose-Capillary: 97 mg/dL (ref 70–99)
Glucose-Capillary: 116 mg/dL — ABNORMAL HIGH (ref 70–99)
Glucose-Capillary: 119 mg/dL — ABNORMAL HIGH (ref 70–99)
Glucose-Capillary: 134 mg/dL — ABNORMAL HIGH (ref 70–99)

## 2024-05-27 LAB — CBC
HCT: 36.8 % — ABNORMAL LOW (ref 39.0–52.0)
HCT: 38.3 % — ABNORMAL LOW (ref 39.0–52.0)
Hemoglobin: 12.2 g/dL — ABNORMAL LOW (ref 13.0–17.0)
Hemoglobin: 12.3 g/dL — ABNORMAL LOW (ref 13.0–17.0)
MCH: 30.1 pg (ref 26.0–34.0)
MCH: 29.5 pg (ref 26.0–34.0)
MCHC: 33.2 g/dL (ref 30.0–36.0)
MCHC: 32.1 g/dL (ref 30.0–36.0)
MCV: 90.9 fL (ref 80.0–100.0)
MCV: 91.8 fL (ref 80.0–100.0)
Platelets: 160 K/uL (ref 150–400)
Platelets: 158 K/uL (ref 150–400)
RBC: 4.05 MIL/uL — ABNORMAL LOW (ref 4.22–5.81)
RBC: 4.17 MIL/uL — ABNORMAL LOW (ref 4.22–5.81)
RDW: 12.7 % (ref 11.5–15.5)
RDW: 13 % (ref 11.5–15.5)
WBC: 10.9 K/uL — ABNORMAL HIGH (ref 4.0–10.5)
WBC: 12.3 K/uL — ABNORMAL HIGH (ref 4.0–10.5)
nRBC: 0 % (ref 0.0–0.2)
nRBC: 0 % (ref 0.0–0.2)

## 2024-05-27 MED ORDER — INSULIN ASPART 100 UNIT/ML IJ SOLN
0.0000 [IU] | INTRAMUSCULAR | Status: DC
  Administered 2024-05-27: 2 [IU] via SUBCUTANEOUS

## 2024-05-27 MED ORDER — ROSUVASTATIN CALCIUM 5 MG PO TABS
10.0000 mg | ORAL_TABLET | Freq: Every day | ORAL | Status: DC
  Administered 2024-05-28 – 2024-06-02 (×6): 10 mg via ORAL
  Filled 2024-05-27 (×7): qty 2

## 2024-05-27 MED ORDER — TRIAMCINOLONE ACETONIDE 0.1 % EX CREA: 1.0000 | TOPICAL_CREAM | Freq: Two times a day (BID) | CUTANEOUS | Status: DC | PRN

## 2024-05-27 MED ORDER — ALPRAZOLAM 0.25 MG PO TABS
0.2500 mg | ORAL_TABLET | Freq: Two times a day (BID) | ORAL | Status: DC | PRN
  Administered 2024-05-27 – 2024-05-31 (×6): 0.25 mg via ORAL
  Filled 2024-05-27 (×6): qty 1

## 2024-05-27 MED ORDER — EZETIMIBE 10 MG PO TABS
10.0000 mg | ORAL_TABLET | Freq: Every day | ORAL | Status: DC
  Administered 2024-05-28 – 2024-06-02 (×6): 10 mg via ORAL
  Filled 2024-05-27 (×6): qty 1

## 2024-05-27 MED ORDER — FUROSEMIDE 10 MG/ML IJ SOLN
40.0000 mg | Freq: Two times a day (BID) | INTRAMUSCULAR | Status: AC
  Administered 2024-05-27 (×2): 40 mg via INTRAVENOUS
  Filled 2024-05-27 (×2): qty 4

## 2024-05-27 MED ORDER — FENTANYL CITRATE PF 50 MCG/ML IJ SOSY
25.0000 ug | PREFILLED_SYRINGE | Freq: Once | INTRAMUSCULAR | Status: AC
  Administered 2024-05-27: 25 ug via INTRAVENOUS
  Filled 2024-05-27: qty 1

## 2024-05-27 MED ORDER — INSULIN GLARGINE 100 UNIT/ML ~~LOC~~ SOLN
10.0000 [IU] | Freq: Every day | SUBCUTANEOUS | Status: DC
  Administered 2024-05-27: 10 [IU] via SUBCUTANEOUS
  Filled 2024-05-27 (×2): qty 0.1

## 2024-05-27 MED ORDER — POTASSIUM CHLORIDE CRYS ER 20 MEQ PO TBCR
20.0000 meq | EXTENDED_RELEASE_TABLET | Freq: Two times a day (BID) | ORAL | Status: DC
  Administered 2024-05-27 – 2024-05-31 (×10): 20 meq via ORAL
  Filled 2024-05-27 (×10): qty 1

## 2024-05-27 MED ORDER — ENOXAPARIN SODIUM 40 MG/0.4ML IJ SOSY: 40.0000 mg | PREFILLED_SYRINGE | Freq: Every day | INTRAMUSCULAR | Status: DC

## 2024-05-27 MED FILL — Thrombin (Recombinant) For Soln 20000 Unit: CUTANEOUS | Qty: 1 | Status: AC

## 2024-05-27 NOTE — Progress Notes (Addendum)
 [address] [place] [place] [address]             [phone]      1 Day Post-Op Procedure(s) (LRB): CORONARY ARTERY BYPASS GRAFTING (CABG) X 4, USING ENDOSCOPIC HARVESTED RIGHT GREATER SAPHENOUS VEIN (N/A) ECHOCARDIOGRAM, TRANSESOPHAGEAL, INTRAOPERATIVE (N/A) Subjective: Patient reports he is anxious this AM and is wondering if he can have something for his anxiety. Denies pain.   Objective: Vital signs in last 24 hours: Temp:  [97.2 F (36.2 C)-99.3 F (37.4 C)] 98.8 F (37.1 C) (09/24 0700) Pulse Rate:  [62-87] 79 (09/24 0700) Cardiac Rhythm: Atrial paced (09/23 1600) Resp:  [8-28] 11 (09/24 0700) BP: (85-131)/(62-97) 95/76 (09/24 0600) SpO2:  [83 %-99 %] 95 % (09/24 0700) Arterial Line BP: (82-118)/(49-69) 105/53 (09/24 0700) FiO2 (%):  [40 %-50 %] 40 % (09/23 1615) Weight:  [99.3 kg] 99.3 kg (09/24 0500)  Hemodynamic parameters for last 24 hours:    Intake/Output from previous day: 09/23 0701 - 09/24 0700 In: 3944.2 [I.V.:2082.1; Blood:225; IV Piggyback:1637.2] Out: 2640 [Urine:1720; Blood:500; Chest Tube:420] Intake/Output this shift: No intake/output data recorded.  General appearance: alert, cooperative, and no distress Neurologic: intact Heart: regular rate and rhythm, S1, S2 normal, no murmur, click, rub or gallop Lungs: diminished bibasilar breath sounds Abdomen: diminished bowel sounds, nontender, no distension Extremities: edema 1+ BLE Wound: Clean and dry dressing in place, EVH site clean and dry without sign of infection  Lab Results: Recent Labs    05/26/24 1953 05/27/24 0410  WBC 12.4* 10.9*  HGB 12.7* 12.2*  HCT 36.8* 36.8*  PLT 159 160   BMET:  Recent Labs    05/26/24 1953 05/27/24 0410  NA 136 135  K 4.2 4.4  CL 105 105  CO2 23 24  GLUCOSE 140* 109*  BUN 26* 27*  CREATININE 0.90 1.07  CALCIUM  7.5* 7.3*    PT/INR:  Recent Labs    05/26/24 1348  LABPROT 17.5*  INR 1.4*   ABG    Component Value  Date/Time   PHART 7.293 (L) 05/26/2024 1800   HCO3 23.2 05/26/2024 1800   TCO2 25 05/26/2024 1800   ACIDBASEDEF 4.0 (H) 05/26/2024 1800   O2SAT 92 05/26/2024 1800   CBG (last 3)  Recent Labs    05/27/24 0237 05/27/24 0410 05/27/24 0552  GLUCAP 123* 123* 93    Assessment/Plan: S/P Procedure(s) (LRB): CORONARY ARTERY BYPASS GRAFTING (CABG) X 4, USING ENDOSCOPIC HARVESTED RIGHT GREATER SAPHENOUS VEIN (N/A) ECHOCARDIOGRAM, TRANSESOPHAGEAL, INTRAOPERATIVE (N/A)  Neuro: Intact, pain controlled. Anxious this AM, will add Xanax  PRN.   CV: AAI paced at 50 but NSR, HR 70s. Off drips. Soft BP, SBP 89-102. Will hold Lopressor  today. D/C EPW. D/C swan and arterial line.   Pulm: Saturating well on 12L HFNC. CT output 420cc/24hrs. Will d/c CT today 2 hours after EPW removal if patient remains stable and no increase in output. Encourage IS and ambulation  GI: No Nausea/vomiting. Tolerating clears. Advance diet as tolerated. Diminished bowel sounds, continue Reglan .   Endo: Hx T2DM, preop HgbA1C 5.7 not on home meds. CBGs controlled on insulin  drip. Will transition to SSI.   Renal: Cr 1.07. UO 1720cc/24hrs. +11lbs from preop weight. Will give IV Lasix  40mg  BID. K 4.4, at goal.   ID: Reactive leukocytosis, WBC 10.9. Tmax 99.3, monitor.   Expected postop ABLA: H/H 12.2/36.8, not clinically significant at this time.   DVT Prophylaxis: Start Lovenox   Dispo: Continue ICU  care today   LOS: 1 day    Con GORMAN Bend, PA-C 05/27/2024   Chart reviewed, patient examined, agree with above.  He is doing well POD 1 DC PW, chest tubes, swan and arterial line. Diuresis and begin mobilization, IS.

## 2024-05-27 NOTE — Discharge Summary (Signed)
 [address] [place] [address]             [phone]        Physician Discharge Summary  Patient ID: Marc Myers MRN: [id number] DOB/AGE: 1956-07-30 68 y.o.  Admit date: 05/26/2024 Discharge date: 06/02/2024  Admission Diagnoses:  Patient Active Problem List   Diagnosis Date Noted   Coronary artery disease of native artery of native heart with stable angina pectoris 05/19/2024   Abnormal cardiac CT angiography 05/19/2024   Microalbuminuria due to type 2 diabetes mellitus (HCC) 04/15/2024   Prostate cancer screening 04/15/2024   Papular rash, localized 04/15/2024   Dyspnea on exertion 04/15/2024   Ectatic thoracic aorta 01/10/2024   Dizziness 03/13/2023   Encounter for preventative adult health care exam with abnormal findings 03/13/2023   Aortic atherosclerosis 09/13/2022   Myalgia due to statin 09/12/2022   Internal hemorrhoids 03/08/2021   Diverticulosis 03/08/2021   Hypertension associated with diabetes (HCC) 11/23/2018   Elevated blood pressure reading in office with white coat syndrome, with diagnosis of hypertension 08/22/2018   Insomnia 08/22/2018   Umbilical hernia without obstruction and without gangrene 07/17/2018   HLD (hyperlipidemia) 07/10/2018   Fatty liver 07/10/2018   Discharge Diagnoses:  Patient Active Problem List   Diagnosis Date Noted   S/P CABG x 4 05/26/2024   Coronary artery disease of native artery of native heart with stable angina pectoris 05/19/2024   Abnormal cardiac CT angiography 05/19/2024   Microalbuminuria due to type 2 diabetes mellitus (HCC) 04/15/2024   Prostate cancer screening 04/15/2024   Papular rash, localized 04/15/2024   Dyspnea on exertion 04/15/2024   Ectatic thoracic aorta 01/10/2024   Dizziness 03/13/2023   Encounter for preventative adult health care exam with abnormal findings 03/13/2023   Aortic atherosclerosis 09/13/2022   Myalgia due to statin 09/12/2022   Internal  hemorrhoids 03/08/2021   Diverticulosis 03/08/2021   Hypertension associated with diabetes (HCC) 11/23/2018   Elevated blood pressure reading in office with white coat syndrome, with diagnosis of hypertension 08/22/2018   Insomnia 08/22/2018   Umbilical hernia without obstruction and without gangrene 07/17/2018   HLD (hyperlipidemia) 07/10/2018   Fatty liver 07/10/2018   Discharged Condition: stable  HPI:   The patient is a 68 year old gentleman with a history of hypertension and hyperlipidemia who had been very active exercising frequently and was evaluated by cardiology in [place] on 01/23/2023 for dizziness that developed while he was working out walking on a treadmill.  His troponins were negative and ECG was normal.  He had no history of chest discomfort or shortness of breath or fatigue and had been going to the gym 5 to 6 days/week and walking several miles on an incline treadmill.  It was felt that he was likely dehydrated at the time that he developed this dizziness.  He then established care with Dr. Gollan that was seen on 07/01/2023 for evaluation of dizziness.  He had a CTA of the chest on 01/23/2024 which was unremarkable.  A CT of the head showed no acute intracranial findings.  His PCP, Dr. Marylynn, got an echocardiogram on 05/06/2024 which showed a left ventricular ejection fraction of 55 to 60% with grade 1 diastolic dysfunction.  There is mild MR.  There is mild AI with a normal trileaflet aortic valve.  He subsequently had a CT coronary morphology study on 05/14/2024 which showed a coronary calcium  score 433 which was 79th percentile.  There was severe mixed plaque in the proximal and mid LAD.  There is moderate soft plaque stenosis in the mid first marginal branch.  The mid RCA was occluded.  CT FFR evaluation showed evidence of significant functional stenosis in the LAD and RCA with an FFR of 0.51 in the LAD.  He underwent cardiac catheterization on 05/19/2024 which showed severe  three-vessel coronary disease with 80 to 90% long segment stenosis in the mid LAD and moderate diffuse disease in the left main.  There is chronic total occlusion of the OM1 and distal RCA filling by collaterals.   He lives with his wife.  He has not been going back to the gym and has not been exerting himself at all since his diagnosis.  He does report some sensation in his chest that he says is greater awareness associated with some shortness of breath usually with exertion.  He feels like the anxiety of his diagnosis is contributing.  Dr. Lucas reviewed the patient's diagnostic studies and determined he would benefit form surgical intervention. He reviewed the patient's treatment options as well as the risks and benefits of surgery with the patient. Marc Myers was agreeable to proceed with surgery.  Hospital Course:  Marc Myers presented to [facility] and was brought to the operating room on 05/26/24. He underwent CABG x 4 utilizing SVG to LAD, SVG to PDA, SVG to OM and SVG to Diagonal as well as endoscopic harvest of the right greater saphenous vein. He tolerated the procedure well and was transferred to the SICU in stable condition.  The patient was extubated the evening of surgery.  The patient complained of anxiety and requested medication to assist with management.  He was in NSR, however his blood pressure was soft running around 100.  He was kept on AAI pacing to assist and Beta Blocker therapy was not initiated.  His pacing wires, chest tubes, and arterial lines were removed without difficulty.  He was started on Lasix  to help facilitate diuresis. He developed a small left apical pneumothorax after chest tube removal, this was monitored closely. He required 15L HFNC, this was weaned as tolerated. CXR 09/26 showed complete collapse of the left lung, left sided chest tube was placed and set to suction. Follow up CXR showed complete reexpansion of the left lung.  His oxygen requirement  decreased and he was weaned to room air as tolerated.  He developed Atrial Fibrillation with RVR and chemically converted to NSR with IV Amiodarone.  His chest tube was transitioned to water  seal.  Chest xray showed no recurrence of pneumothorax and his chest tube was removed without difficulty on 05/31/2024. Follow up chest xray showed a small left pleural effusion that was stable, stable bibasilar atelectasis and a tiny left apical pneumothorax. He was experiencing some mild constipation and his bowel regimen was adjusted.  He remained in NSR and was transitioned to oral amiodarone on 05/31/2024.  He was also felt to be stable for transfer to the progressive care unit on post-op day 4.  He remained in stable sinus rhythm on oral amiodarone, Lopressor  was titrated for further blood pressure and heart rate control. He was hypertensive and started on low dose Cozaar . He progressed to independent mobility and had return of appropriate bowel and bladder function. His incisions were healing with no sign of complication. He was ambulating well on room air. He was felt stable for discharge.   Consults: None  Significant Diagnostic Studies: angiography:   Conclusions: Severe  three-vessel coronary artery disease, including long segment of moderate to severe mid LAD disease of up to 80-90%, moderate diffuse disease of the LMCA, and chronic total occlusions of OM1 and distal RCA. Normal left ventricular filling pressure (LVEDP 10 mmHg).   Recommendations: Outpatient cardiac surgery consultation for CABG. Continue antianginal therapy with amlodipine  and isosorbide  mononitrate. Continue rosuvastatin  and ezetimibe  for secondary prevention of coronary artery disease; consider escalation of rosuvastatin  to 20-40 mg daily if LDL remains above 70 on follow-up labs.   Lonni Hanson, MD [facility]   Treatments: surgery:   05/26/2024   Surgeon:  Dorise LOIS Fellers, MD   First Assistant: Con Bend,  PA-C:    An experienced assistant was required given the complexity of this surgery and the standard of surgical care. The assistant was needed for endoscopic vein harvest, exposure, dissection, suctioning, retraction of delicate tissues and sutures, instrument exchange and for overall help during this procedure.     Preoperative Diagnosis:  Severe multi-vessel coronary artery disease     Postoperative Diagnosis:  Same     Procedure:   Median Sternotomy Extracorporeal circulation 3.   Coronary artery bypass grafting x 4   Saphenous vein graft to the LAD Saphenous vein graft to the diagonal 1 Saphenous vein graft to the OM 1 Saphenous vein graft to the PDA  4.   Endoscopic vein harvest from the right leg   Discharge Exam: Blood pressure (!) 144/93, pulse 67, temperature 98 F (36.7 C), temperature source Oral, resp. rate 17, height 5' 9 (1.753 m), weight 95.4 kg, SpO2 94%. General appearance: alert, cooperative, and no distress Neurologic: intact Heart: regular rate and rhythm, systolic murmur LLSB Lungs: clear to auscultation bilaterally Abdomen: soft, non-tender; bowel sounds normal; no masses,  no organomegaly Extremities: extremities normal, atraumatic, no cyanosis or edema Wound: Clean and dry without sign of infection  Discharge Medications:  The patient has been discharged on:   1.Beta Blocker:  Yes [ X  ]                              No   [   ]                              If No, reason:  2.Ace Inhibitor/ARB: Yes [  X ]                                     No  [    ]                                     If No, reason:  3.Statin:   Yes [ X  ]                  No  [   ]                  If No, reason:  4.Ecasa:  Yes  [  X ]                  No   [   ]  If No, reason:  Patient had ACS upon admission: No  Plavix /P2Y12 inhibitor: Yes [  X ] diffuse CAD                                      No  [   ]     Discharge Instructions     Amb Referral to  Cardiac Rehabilitation   Complete by: As directed    Diagnosis: CABG   CABG X ___: 4   After initial evaluation and assessments completed: Virtual Based Care may be provided alone or in conjunction with Phase 2 Cardiac Rehab based on patient barriers.: Yes   Intensive Cardiac Rehabilitation (ICR) [facility] location only OR Traditional Cardiac Rehabilitation (TCR) *If criteria for ICR are not met will enroll in TCR ([facility] only): Yes      Allergies as of 06/02/2024   No Known Allergies      Medication List     STOP taking these medications    amLODipine  2.5 MG tablet Commonly known as: NORVASC    isosorbide  mononitrate 30 MG 24 hr tablet Commonly known as: IMDUR        TAKE these medications    amiodarone 200 MG tablet Commonly known as: PACERONE Take 2 tablets (400 mg total) by mouth 2 (two) times daily for 7 days, THEN 1 tablet (200 mg total) 2 (two) times daily for 7 days, THEN 1 tablet (200 mg total) daily. Start taking on: June 02, 2024   aspirin  EC 81 MG tablet Take 1 tablet (81 mg total) by mouth daily. Swallow whole.   clopidogrel  75 MG tablet Commonly known as: PLAVIX  Take 1 tablet (75 mg total) by mouth daily.   ezetimibe  10 MG tablet Commonly known as: ZETIA  Take 1 tablet (10 mg total) by mouth daily.   losartan  25 MG tablet Commonly known as: COZAAR  Take 1 tablet (25 mg total) by mouth at bedtime. What changed:  medication strength how much to take   metoprolol  tartrate 25 MG tablet Commonly known as: LOPRESSOR  Take 1 tablet (25 mg total) by mouth 2 (two) times daily. What changed:  medication strength how much to take how to take this when to take this additional instructions   oxyCODONE  5 MG immediate release tablet Commonly known as: Oxy IR/ROXICODONE  Take 1 tablet (5 mg total) by mouth every 6 (six) hours as needed for severe pain (pain score 7-10).   rosuvastatin  10 MG tablet Commonly known as: CRESTOR  Take 1 tablet (10 mg total) by mouth  daily.   triamcinolone  cream 0.1 % Commonly known as: KENALOG  Apply 1 Application topically 2 (two) times daily. To rash What changed:  when to take this reasons to take this additional instructions               Durable Medical Equipment  (From admission, onward)           Start     Ordered   06/01/24 1555  For home use only DME Walker rolling  Once       Question Answer Comment  Walker: With 5 Inch Wheels   Patient needs a walker to treat with the following condition S/P CABG x 4      06/01/24 1555            Follow-up Information     Rutha Manuelita HERO, PA-C Follow up on 06/10/2024.   Specialties: Physician Assistant, Thoracic Surgery  Why: Follow up appointment is at 1:00PM, please get chest xray at 12:00PM prior to your appointment on the 2nd floor of our building Contact information: [address], Zone [place] [address] [phone]         Loistine Sober, NP Follow up on 06/18/2024.   Specialty: Cardiology Why: Cardiology appointment is at 8:25AM Contact information: [address] [place] [address] [phone]                 Signed:  Con GORMAN Bend, PA-C  06/02/2024, 9:10 AM

## 2024-05-27 NOTE — TOC Initial Note (Signed)
 Transition of Care [facility]) - Initial/Assessment Note    Patient Details  Name: Marc Myers MRN: [id number] Date of Birth: 09-16-1955  Transition of Care [facility]) CM/SW Contact:    Sudie Erminio Deems, RN Phone Number: 05/27/2024, 2:24 PM  Clinical Narrative: Patient POD-1 CABG. PTA patient was independent from home with spouse. Patient states he has insurance and PCP. Patient has no issues with transportation. Patient does not have a history of HH Services. Inpatient Case Manager will continue to follow for additional disposition needs.               Expected Discharge Plan: Home w Home Health Services Barriers to Discharge: Continued Medical Work up   Patient Goals and CMS Choice Patient states their goals for this hospitalization and ongoing recovery are:: Plan to return home once stable.   Expected Discharge Plan and Services In-house Referral: NA Discharge Planning Services: CM Consult Post Acute Care Choice: Home Health Living arrangements for the past 2 months: Single Family Home   Prior Living Arrangements/Services Living arrangements for the past 2 months: Single Family Home Lives with:: Spouse Patient language and need for interpreter reviewed:: Yes Do you feel safe going back to the place where you live?: Yes      Need for Family Participation in Patient Care: Yes (Comment) Care giver support system in place?: Yes (comment)   Criminal Activity/Legal Involvement Pertinent to Current Situation/Hospitalization: No - Comment as needed  Activities of Daily Living      Permission Sought/Granted Permission sought to share information with : Family Supports, Case Manager    Emotional Assessment Appearance:: Appears stated age Attitude/Demeanor/Rapport: Engaged Affect (typically observed): Appropriate Orientation: : Oriented to Self, Oriented to Place, Oriented to  Time, Oriented to Situation Alcohol / Substance Use: Not Applicable Psych Involvement: No  (comment)  Admission diagnosis:  Coronary artery disease involving native coronary artery of native heart without angina pectoris [I25.10] S/P CABG x 4 [Z95.1] Patient Active Problem List   Diagnosis Date Noted   S/P CABG x 4 05/26/2024   Coronary artery disease of native artery of native heart with stable angina pectoris 05/19/2024   Abnormal cardiac CT angiography 05/19/2024   Microalbuminuria due to type 2 diabetes mellitus (HCC) 04/15/2024   Prostate cancer screening 04/15/2024   Papular rash, localized 04/15/2024   Dyspnea on exertion 04/15/2024   Ectatic thoracic aorta 01/10/2024   Dizziness 03/13/2023   Encounter for preventative adult health care exam with abnormal findings 03/13/2023   Aortic atherosclerosis 09/13/2022   Myalgia due to statin 09/12/2022   Internal hemorrhoids 03/08/2021   Diverticulosis 03/08/2021   Hypertension associated with diabetes (HCC) 11/23/2018   Elevated blood pressure reading in office with white coat syndrome, with diagnosis of hypertension 08/22/2018   Insomnia 08/22/2018   Umbilical hernia without obstruction and without gangrene 07/17/2018   HLD (hyperlipidemia) 07/10/2018   Fatty liver 07/10/2018   PCP:  Marylynn Verneita CROME, MD Pharmacy:   [facility] [id number] [place] [place], [place] - [address] [place] [address] Phone: [phone] Fax: [phone]  Social Drivers of Health (SDOH) Social History: SDOH Screenings   Transportation Needs: No Transportation Needs (01/23/2023)  Depression (PHQ2-9): Low Risk  (04/15/2024)  Tobacco Use: Medium Risk (05/26/2024)   SDOH Interventions:     Readmission Risk Interventions     No data to display

## 2024-05-27 NOTE — Plan of Care (Signed)

## 2024-05-27 NOTE — Progress Notes (Signed)
 Patient ID: Marc Myers, male   DOB: [birth year], 68 y.o.   MRN: [id number]  TCTS Evening Rounds:  Hemodynamically stable in sinus rhythm.  Diuresing some today. Creat up slightly this pm to 1.47 so Toradol  stopped.  Ambulated some today.  He has had increased oxygen requirement today and on 15 L NRB since he is a mouth breather. Needs to work on IS.

## 2024-05-28 ENCOUNTER — Inpatient Hospital Stay (HOSPITAL_COMMUNITY)

## 2024-05-28 ENCOUNTER — Encounter

## 2024-05-28 LAB — BASIC METABOLIC PANEL WITH GFR
Anion gap: 5 (ref 5–15)
BUN: 34 mg/dL — ABNORMAL HIGH (ref 8–23)
CO2: 26 mmol/L (ref 22–32)
Calcium: 7.8 mg/dL — ABNORMAL LOW (ref 8.9–10.3)
Chloride: 104 mmol/L (ref 98–111)
Creatinine, Ser: 1.38 mg/dL — ABNORMAL HIGH (ref 0.61–1.24)
GFR, Estimated: 56 mL/min — ABNORMAL LOW (ref >60)
Glucose, Bld: 144 mg/dL — ABNORMAL HIGH (ref 70–99)
Potassium: 4.4 mmol/L (ref 3.5–5.1)
Sodium: 135 mmol/L (ref 135–145)

## 2024-05-28 LAB — GLUCOSE, CAPILLARY
Glucose-Capillary: 112 mg/dL — ABNORMAL HIGH (ref 70–99)
Glucose-Capillary: 133 mg/dL — ABNORMAL HIGH (ref 70–99)
Glucose-Capillary: 124 mg/dL — ABNORMAL HIGH (ref 70–99)
Glucose-Capillary: 134 mg/dL — ABNORMAL HIGH (ref 70–99)
Glucose-Capillary: 103 mg/dL — ABNORMAL HIGH (ref 70–99)
Glucose-Capillary: 184 mg/dL — ABNORMAL HIGH (ref 70–99)

## 2024-05-28 LAB — ECHO INTRAOPERATIVE TEE
AR max vel: 4.28 cm2
AV Area VTI: 4.12 cm2
AV Area mean vel: 4.83 cm2
AV Mean grad: 4 mmHg
AV Peak grad: 11.8 mmHg
Ao pk vel: 1.72 m/s
Height: 69 in
MV VTI: 4.62 cm2
Weight: 3312 [oz_av]

## 2024-05-28 LAB — CBC
HCT: 35.7 % — ABNORMAL LOW (ref 39.0–52.0)
Hemoglobin: 11.5 g/dL — ABNORMAL LOW (ref 13.0–17.0)
MCH: 30.1 pg (ref 26.0–34.0)
MCHC: 32.2 g/dL (ref 30.0–36.0)
MCV: 93.5 fL (ref 80.0–100.0)
Platelets: 138 K/uL — ABNORMAL LOW (ref 150–400)
RBC: 3.82 MIL/uL — ABNORMAL LOW (ref 4.22–5.81)
RDW: 13 % (ref 11.5–15.5)
WBC: 12.5 K/uL — ABNORMAL HIGH (ref 4.0–10.5)
nRBC: 0 % (ref 0.0–0.2)

## 2024-05-28 MED ORDER — ASPIRIN 81 MG PO TBEC
81.0000 mg | DELAYED_RELEASE_TABLET | Freq: Every day | ORAL | Status: DC
  Administered 2024-05-28 – 2024-06-02 (×6): 81 mg via ORAL
  Filled 2024-05-28 (×6): qty 1

## 2024-05-28 MED ORDER — TORSEMIDE 20 MG PO TABS
40.0000 mg | ORAL_TABLET | Freq: Every day | ORAL | Status: AC
  Administered 2024-05-28 – 2024-05-29 (×2): 40 mg via ORAL
  Filled 2024-05-28 (×2): qty 2

## 2024-05-28 MED ORDER — INSULIN ASPART 100 UNIT/ML IJ SOLN
0.0000 [IU] | Freq: Three times a day (TID) | INTRAMUSCULAR | Status: DC
  Administered 2024-05-28 – 2024-05-30 (×7): 2 [IU] via SUBCUTANEOUS

## 2024-05-28 MED ORDER — METOPROLOL TARTRATE 12.5 MG HALF TABLET
12.5000 mg | ORAL_TABLET | Freq: Two times a day (BID) | ORAL | Status: DC
  Administered 2024-05-28 – 2024-05-31 (×8): 12.5 mg via ORAL
  Filled 2024-05-28 (×8): qty 1

## 2024-05-28 MED ORDER — CLOPIDOGREL BISULFATE 75 MG PO TABS
75.0000 mg | ORAL_TABLET | Freq: Every day | ORAL | Status: DC
  Administered 2024-05-28 – 2024-06-02 (×6): 75 mg via ORAL
  Filled 2024-05-28 (×6): qty 1

## 2024-05-28 MED FILL — Electrolyte-R (PH 7.4) Solution: INTRAVENOUS | Qty: 5000 | Status: AC

## 2024-05-28 MED FILL — Sodium Chloride IV Soln 0.9%: INTRAVENOUS | Qty: 2000 | Status: AC

## 2024-05-28 MED FILL — Potassium Chloride Inj 2 mEq/ML: INTRAVENOUS | Qty: 40 | Status: AC

## 2024-05-28 MED FILL — Heparin Sodium (Porcine) Inj 1000 Unit/ML: Qty: 1000 | Status: AC

## 2024-05-28 MED FILL — Heparin Sodium (Porcine) Inj 1000 Unit/ML: INTRAMUSCULAR | Qty: 30 | Status: AC

## 2024-05-28 MED FILL — Heparin Sodium (Porcine) Inj 1000 Unit/ML: INTRAMUSCULAR | Qty: 10 | Status: AC

## 2024-05-28 MED FILL — Sodium Bicarbonate IV Soln 8.4%: INTRAVENOUS | Qty: 50 | Status: AC

## 2024-05-28 MED FILL — Magnesium Sulfate Inj 50%: INTRAMUSCULAR | Qty: 10 | Status: AC

## 2024-05-28 MED FILL — Lidocaine HCl Local Soln Prefilled Syringe 100 MG/5ML (2%): INTRAMUSCULAR | Qty: 5 | Status: AC

## 2024-05-28 NOTE — Progress Notes (Signed)
 2 Days Post-Op Procedure(s) (LRB): CORONARY ARTERY BYPASS GRAFTING (CABG) X 4, USING ENDOSCOPIC HARVESTED RIGHT GREATER SAPHENOUS VEIN (N/A) ECHOCARDIOGRAM, TRANSESOPHAGEAL, INTRAOPERATIVE (N/A) Subjective: No specific complaints. Up in chair. Still on facemask oxygen 15 L due to mouth breathing.   Ambulated a short lap.  -1100 cc yesterday. Wt down 2 lbs. Still 10 lbs over preop.  Objective: Vital signs in last 24 hours: Temp:  [98.2 F (36.8 C)-99.1 F (37.3 C)] 98.4 F (36.9 C) (09/25 0305) Pulse Rate:  [73-94] 87 (09/25 0650) Cardiac Rhythm: Normal sinus rhythm (09/24 2000) Resp:  [8-22] 12 (09/25 0650) BP: (88-140)/(53-92) 110/75 (09/25 0650) SpO2:  [87 %-99 %] 92 % (09/25 0650) Arterial Line BP: (104)/(78) 104/78 (09/24 0800) Weight:  [98.6 kg] 98.6 kg (09/25 0500)  Hemodynamic parameters for last 24 hours:    Intake/Output from previous day: 09/24 0701 - 09/25 0700 In: 287 [I.V.:24.1; IV Piggyback:262.8] Out: 1395 [Urine:1375; Chest Tube:20] Intake/Output this shift: Total I/O In: 113 [I.V.:13; IV Piggyback:100] Out: 700 [Urine:700]  General appearance: alert and cooperative Neurologic: intact Heart: regular rate and rhythm Lungs: clear to auscultation bilaterally Extremities: edema mild Wound: incision healing well.  Lab Results: Recent Labs    05/27/24 0410 05/27/24 1434  WBC 10.9* 12.3*  HGB 12.2* 12.3*  HCT 36.8* 38.3*  PLT 160 158   BMET:  Recent Labs    05/27/24 0410 05/27/24 1434  NA 135 135  K 4.4 4.7  CL 105 102  CO2 24 25  GLUCOSE 109* 128*  BUN 27* 30*  CREATININE 1.07 1.47*  CALCIUM  7.3* 7.7*    PT/INR:  Recent Labs    05/26/24 1348  LABPROT 17.5*  INR 1.4*   ABG    Component Value Date/Time   PHART 7.293 (L) 05/26/2024 1800   HCO3 23.2 05/26/2024 1800   TCO2 25 05/26/2024 1800   ACIDBASEDEF 4.0 (H) 05/26/2024 1800   O2SAT 92 05/26/2024 1800   CBG (last 3)  Recent Labs    05/27/24 1936 05/27/24 2308  05/28/24 0308  GLUCAP 119* 134* 112*   CXR: small left apical ptx, probably 10-20 % by my estimation, patching atelectasis bilat.  Assessment/Plan: S/P Procedure(s) (LRB): CORONARY ARTERY BYPASS GRAFTING (CABG) X 4, USING ENDOSCOPIC HARVESTED RIGHT GREATER SAPHENOUS VEIN (N/A) ECHOCARDIOGRAM, TRANSESOPHAGEAL, INTRAOPERATIVE (N/A)  POD 2  Hemodynamically stable in sinus rhythm. Start low dose Lopressor .  Continue diuresis with Demedex for a couple day. BMET pending this am.  Repeat CXR in am for left ptx.  IS, mobilize. Wean oxygen as tolerated.  Glucose under good control with preop Hgb A1c of 5.7. Will DC Lantus  and continue SSI today but probably can stop tomorrow.  Will use ASA 81 and Plavix  75 in this pt with diffuse CAD.   LOS: 2 days    Dorise MARLA Fellers 05/28/2024

## 2024-05-28 NOTE — TOC Progression Note (Signed)
 Transition of Care [facility]) - Progression Note    Patient Details  Name: Marc Myers MRN: [id number] Date of Birth: 08/20/1956  Transition of Care [facility]) CM/SW Contact  Justina Delcia Czar, RN Phone Number: [phone] 05/28/2024, 4:10 PM  Clinical Narrative:    Spoke to pt and gave permission to speak to dtr, [name]. Offered choice for [facility], agreeable to [facility] Medicare.gov list with rating placed on chart.   Will continue to follow for dc needs.    Expected Discharge Plan: Home w Home Health Services Barriers to Discharge: Continued Medical Work up               Expected Discharge Plan and Services In-house Referral: NA Discharge Planning Services: CM Consult Post Acute Care Choice: Home Health Living arrangements for the past 2 months: Single Family Home                           HH Arranged: RN [facility] Agency: [facility] Date HH Agency Contacted: 05/28/24 Time HH Agency Contacted: 1608 Representative spoke with at [facility] Agency: Zebedee Braver   Social Drivers of Health (SDOH) Interventions SDOH Screenings   Transportation Needs: No Transportation Needs (01/23/2023)  Depression (PHQ2-9): Low Risk  (04/15/2024)  Tobacco Use: Medium Risk (05/26/2024)    Readmission Risk Interventions     No data to display

## 2024-05-28 NOTE — Anesthesia Postprocedure Evaluation (Signed)
 Anesthesia Post Note  Patient: Marc Myers  Procedure(s) Performed: CORONARY ARTERY BYPASS GRAFTING (CABG) X 4, USING ENDOSCOPIC HARVESTED RIGHT GREATER SAPHENOUS VEIN (Chest) ECHOCARDIOGRAM, TRANSESOPHAGEAL, INTRAOPERATIVE     Patient location during evaluation: ICU Anesthesia Type: General Level of consciousness: sedated Pain management: pain level controlled Vital Signs Assessment: post-procedure vital signs reviewed and stable Respiratory status: patient remains intubated per anesthesia plan Cardiovascular status: stable Postop Assessment: no apparent nausea or vomiting Anesthetic complications: no   There were no known notable events for this encounter.  Last Vitals:  Vitals:   05/28/24 1630 05/28/24 1635  BP: (!) 139/98   Pulse: 96 91  Resp: 18 16  Temp:    SpO2: 90% (!) 88%    Last Pain:  Vitals:   05/28/24 1635  TempSrc:   PainSc: 5                  Tonesha Tsou

## 2024-05-28 NOTE — Progress Notes (Signed)
 EVENING ROUNDS NOTE :     [address]       [place] [address]             [phone]                 2 Days Post-Op Procedure(s) (LRB): CORONARY ARTERY BYPASS GRAFTING (CABG) X 4, USING ENDOSCOPIC HARVESTED RIGHT GREATER SAPHENOUS VEIN (N/A) ECHOCARDIOGRAM, TRANSESOPHAGEAL, INTRAOPERATIVE (N/A)   Total Length of Stay:  LOS: 2 days  Events:   No events Resting comfortably    BP 119/70   Pulse 90   Temp 98.2 F (36.8 C) (Oral)   Resp 17   Ht 5' 9 (1.753 m)   Wt 98.6 kg   SpO2 97%   BMI 32.10 kg/m           I/O last 3 completed shifts: In: 748.5 [I.V.:41.7; IV Piggyback:706.8] Out: 2370 [Urine:2030; Chest Tube:340]      Latest Ref Rng & Units 05/28/2024    4:18 AM 05/27/2024    2:34 PM 05/27/2024    4:10 AM  CBC  WBC 4.0 - 10.5 K/uL 12.5  12.3  10.9   Hemoglobin 13.0 - 17.0 g/dL 88.4  87.6  87.7   Hematocrit 39.0 - 52.0 % 35.7  38.3  36.8   Platelets 150 - 400 K/uL 138  158  160        Latest Ref Rng & Units 05/28/2024    4:18 AM 05/27/2024    2:34 PM 05/27/2024    4:10 AM  BMP  Glucose 70 - 99 mg/dL 855  871  890   BUN 8 - 23 mg/dL 34  30  27   Creatinine 0.61 - 1.24 mg/dL 8.61  8.52  8.92   Sodium 135 - 145 mmol/L 135  135  135   Potassium 3.5 - 5.1 mmol/L 4.4  4.7  4.4   Chloride 98 - 111 mmol/L 104  102  105   CO2 22 - 32 mmol/L 26  25  24    Calcium  8.9 - 10.3 mg/dL 7.8  7.7  7.3     ABG    Component Value Date/Time   PHART 7.293 (L) 05/26/2024 1800   PCO2ART 47.9 05/26/2024 1800   PO2ART 72 (L) 05/26/2024 1800   HCO3 23.2 05/26/2024 1800   TCO2 25 05/26/2024 1800   ACIDBASEDEF 4.0 (H) 05/26/2024 1800   O2SAT 92 05/26/2024 1800       Linnie Rayas, MD 05/28/2024 6:42 PM

## 2024-05-29 ENCOUNTER — Inpatient Hospital Stay (HOSPITAL_COMMUNITY)

## 2024-05-29 DIAGNOSIS — J939 Pneumothorax, unspecified: Secondary | ICD-10-CM | POA: Diagnosis not present

## 2024-05-29 LAB — BASIC METABOLIC PANEL WITH GFR
Anion gap: 9 (ref 5–15)
BUN: 36 mg/dL — ABNORMAL HIGH (ref 8–23)
CO2: 27 mmol/L (ref 22–32)
Calcium: 8.1 mg/dL — ABNORMAL LOW (ref 8.9–10.3)
Chloride: 98 mmol/L (ref 98–111)
Creatinine, Ser: 1.17 mg/dL (ref 0.61–1.24)
GFR, Estimated: >60 mL/min (ref >60)
Glucose, Bld: 114 mg/dL — ABNORMAL HIGH (ref 70–99)
Potassium: 4.5 mmol/L (ref 3.5–5.1)
Sodium: 134 mmol/L — ABNORMAL LOW (ref 135–145)

## 2024-05-29 LAB — GLUCOSE, CAPILLARY
Glucose-Capillary: 150 mg/dL — ABNORMAL HIGH (ref 70–99)
Glucose-Capillary: 115 mg/dL — ABNORMAL HIGH (ref 70–99)
Glucose-Capillary: 158 mg/dL — ABNORMAL HIGH (ref 70–99)
Glucose-Capillary: 104 mg/dL — ABNORMAL HIGH (ref 70–99)

## 2024-05-29 LAB — CBC
HCT: 35.5 % — ABNORMAL LOW (ref 39.0–52.0)
Hemoglobin: 11.5 g/dL — ABNORMAL LOW (ref 13.0–17.0)
MCH: 30 pg (ref 26.0–34.0)
MCHC: 32.4 g/dL (ref 30.0–36.0)
MCV: 92.7 fL (ref 80.0–100.0)
Platelets: 152 K/uL (ref 150–400)
RBC: 3.83 MIL/uL — ABNORMAL LOW (ref 4.22–5.81)
RDW: 12.9 % (ref 11.5–15.5)
WBC: 10.7 K/uL — ABNORMAL HIGH (ref 4.0–10.5)
nRBC: 0 % (ref 0.0–0.2)

## 2024-05-29 MED ORDER — ENSURE PLUS HIGH PROTEIN PO LIQD: 237.0000 mL | Freq: Two times a day (BID) | ORAL | Status: DC

## 2024-05-29 MED ORDER — LIDOCAINE HCL (PF) 1 % IJ SOLN
INTRAMUSCULAR | Status: AC
  Filled 2024-05-29: qty 30

## 2024-05-29 MED ORDER — GLUCERNA SHAKE PO LIQD
237.0000 mL | Freq: Three times a day (TID) | ORAL | Status: DC
Start: 2024-05-29 — End: 2024-06-02
  Administered 2024-05-29 – 2024-06-01 (×4): 237 mL via ORAL

## 2024-05-29 NOTE — Plan of Care (Signed)
  Problem: Clinical Measurements: Goal: Respiratory complications will improve Outcome: Progressing   Problem: Clinical Measurements: Goal: Respiratory complications will improve Outcome: Progressing   Problem: Clinical Measurements: Goal: Respiratory complications will improve Outcome: Progressing

## 2024-05-29 NOTE — Progress Notes (Signed)
 TCTS PM Rounding Progress Note 05/29/24   Had a good day today - walking and passing gas but no BM Minimal appetite S/p chest tube this AM for left pneumothorax - no air leak, lung up on post-tube CXR  Vitals:   05/29/24 1600 05/29/24 1700  BP: 106/78 107/88  Pulse: 85 86  Resp: 14   Temp:    SpO2: 97% 95%    Plan: - Continue chest tube to suction - Encourage glucerna shakes - Awaiting BM  Con Clunes, MD Cardiothoracic Surgery Pager: [phone]    [address] [place] [address]             [phone]

## 2024-05-29 NOTE — CV Procedure (Signed)
 Chest Tube Insertion Procedure Note  Indications:  Clinically significant Pneumothorax  Pre-operative Diagnosis: Pneumothorax  Post-operative Diagnosis: Pneumothorax  Procedure Details  Informed consent was obtained for the procedure, including sedation.  Risks of lung perforation, hemorrhage, arrhythmia, and adverse drug reaction were discussed.   After sterile skin prep, using standard technique, a 20 French tube was placed in the left anterolateral 8th rib space.  Findings: Large rush of air.  Estimated Blood Loss:  none         Specimens:  None              Complications:  None; patient tolerated the procedure well.         Disposition: He is in ICU         Condition: stable  Attending Attestation: I performed the procedure.   CXR afterwards shows good tube position with complete reexpansion of left lung.

## 2024-05-29 NOTE — Progress Notes (Signed)
 3 Days Post-Op Procedure(s) (LRB): CORONARY ARTERY BYPASS GRAFTING (CABG) X 4, USING ENDOSCOPIC HARVESTED RIGHT GREATER SAPHENOUS VEIN (N/A) ECHOCARDIOGRAM, TRANSESOPHAGEAL, INTRAOPERATIVE (N/A) Subjective:  Feels ok. CXR this am showed complete collapse of left lung. On 15L FM.  Objective: Vital signs in last 24 hours: Temp:  [97.8 F (36.6 C)-98.3 F (36.8 C)] 97.8 F (36.6 C) (09/26 0313) Pulse Rate:  [75-103] 103 (09/26 0700) Cardiac Rhythm: Normal sinus rhythm (09/25 2000) Resp:  [7-26] 22 (09/26 0700) BP: (95-152)/(54-100) 116/92 (09/26 0700) SpO2:  [82 %-98 %] 87 % (09/26 0700) Weight:  [97.6 kg] 97.6 kg (09/26 0500)  Hemodynamic parameters for last 24 hours:    Intake/Output from previous day: 09/25 0701 - 09/26 0700 In: 961 [P.O.:960; I.V.:1] Out: 1440 [Urine:1440] Intake/Output this shift: No intake/output data recorded.  General appearance: alert and cooperative Neurologic: intact Heart: regular rate and rhythm Lungs: clear on right, no BS on left Extremities: edema minimal Wound: incisions healing well.  Lab Results: Recent Labs    05/28/24 0418 05/29/24 0454  WBC 12.5* 10.7*  HGB 11.5* 11.5*  HCT 35.7* 35.5*  PLT 138* 152   BMET:  Recent Labs    05/28/24 0418 05/29/24 0454  NA 135 134*  K 4.4 4.5  CL 104 98  CO2 26 27  GLUCOSE 144* 114*  BUN 34* 36*  CREATININE 1.38* 1.17  CALCIUM  7.8* 8.1*    PT/INR:  Recent Labs    05/26/24 1348  LABPROT 17.5*  INR 1.4*   ABG    Component Value Date/Time   PHART 7.293 (L) 05/26/2024 1800   HCO3 23.2 05/26/2024 1800   TCO2 25 05/26/2024 1800   ACIDBASEDEF 4.0 (H) 05/26/2024 1800   O2SAT 92 05/26/2024 1800   CBG (last 3)  Recent Labs    05/28/24 1925 05/28/24 2056 05/29/24 0632  GLUCAP 103* 184* 150*   CXR this am: complete collapse of left lung.   Assessment/Plan: S/P Procedure(s) (LRB): CORONARY ARTERY BYPASS GRAFTING (CABG) X 4, USING ENDOSCOPIC HARVESTED RIGHT GREATER SAPHENOUS  VEIN (N/A) ECHOCARDIOGRAM, TRANSESOPHAGEAL, INTRAOPERATIVE (N/A)  POD 3  Hemodynamically stable in sinus rhythm. Continue Lopressor .  -500 cc yesterday. Wt down 2 lbs. Still 8 lbs over preop so will continue diuretic and KCL.  Glucose under adequate control. DC CBG's  Collapse of left lung with large left ptx. Will insert chest tube.  DC sleeve.  Continue IS, ambulation. Wean oxygen.   LOS: 3 days    Marc Myers 05/29/2024

## 2024-05-30 ENCOUNTER — Inpatient Hospital Stay (HOSPITAL_COMMUNITY)

## 2024-05-30 LAB — CBC
HCT: 32.7 % — ABNORMAL LOW (ref 39.0–52.0)
Hemoglobin: 10.8 g/dL — ABNORMAL LOW (ref 13.0–17.0)
MCH: 30.2 pg (ref 26.0–34.0)
MCHC: 33 g/dL (ref 30.0–36.0)
MCV: 91.3 fL (ref 80.0–100.0)
Platelets: 197 K/uL (ref 150–400)
RBC: 3.58 MIL/uL — ABNORMAL LOW (ref 4.22–5.81)
RDW: 12.9 % (ref 11.5–15.5)
WBC: 9.1 K/uL (ref 4.0–10.5)
nRBC: 0 % (ref 0.0–0.2)

## 2024-05-30 LAB — BASIC METABOLIC PANEL WITH GFR
Anion gap: 10 (ref 5–15)
BUN: 37 mg/dL — ABNORMAL HIGH (ref 8–23)
CO2: 31 mmol/L (ref 22–32)
Calcium: 8.6 mg/dL — ABNORMAL LOW (ref 8.9–10.3)
Chloride: 96 mmol/L — ABNORMAL LOW (ref 98–111)
Creatinine, Ser: 1.15 mg/dL (ref 0.61–1.24)
GFR, Estimated: >60 mL/min (ref >60)
Glucose, Bld: 117 mg/dL — ABNORMAL HIGH (ref 70–99)
Potassium: 4.1 mmol/L (ref 3.5–5.1)
Sodium: 137 mmol/L (ref 135–145)

## 2024-05-30 LAB — GLUCOSE, CAPILLARY
Glucose-Capillary: 120 mg/dL — ABNORMAL HIGH (ref 70–99)
Glucose-Capillary: 118 mg/dL — ABNORMAL HIGH (ref 70–99)
Glucose-Capillary: 133 mg/dL — ABNORMAL HIGH (ref 70–99)
Glucose-Capillary: 113 mg/dL — ABNORMAL HIGH (ref 70–99)

## 2024-05-30 LAB — MAGNESIUM: Magnesium: 2.2 mg/dL (ref 1.7–2.4)

## 2024-05-30 MED ORDER — LACTULOSE 10 GM/15ML PO SOLN
10.0000 g | Freq: Once | ORAL | Status: AC
  Administered 2024-05-30: 10 g via ORAL
  Filled 2024-05-30: qty 15

## 2024-05-30 MED ORDER — TORSEMIDE 20 MG PO TABS
40.0000 mg | ORAL_TABLET | Freq: Every day | ORAL | Status: DC
Start: 2024-05-30 — End: 2024-06-01
  Administered 2024-05-30: 40 mg via ORAL
  Filled 2024-05-30: qty 2

## 2024-05-30 MED ORDER — SODIUM CHLORIDE 0.9 % IV SOLN: 250.0000 mL | INTRAVENOUS | Status: DC | PRN

## 2024-05-30 MED ORDER — BISACODYL 10 MG RE SUPP
10.0000 mg | Freq: Once | RECTAL | Status: AC
  Administered 2024-05-30: 10 mg via RECTAL
  Filled 2024-05-30: qty 1

## 2024-05-30 MED ORDER — SODIUM CHLORIDE 0.9% FLUSH: 3.0000 mL | INTRAVENOUS | Status: DC | PRN

## 2024-05-30 MED ORDER — ~~LOC~~ CARDIAC SURGERY, PATIENT & FAMILY EDUCATION
Freq: Once | Status: AC
  Administered 2024-05-30: 1

## 2024-05-30 MED ORDER — AMIODARONE LOAD VIA INFUSION
150.0000 mg | Freq: Once | INTRAVENOUS | Status: AC
  Administered 2024-05-30: 150 mg via INTRAVENOUS
  Filled 2024-05-30: qty 83.34

## 2024-05-30 MED ORDER — AMIODARONE HCL IN DEXTROSE 360-4.14 MG/200ML-% IV SOLN
60.0000 mg/h | INTRAVENOUS | Status: AC
  Administered 2024-05-30 (×2): 60 mg/h via INTRAVENOUS
  Filled 2024-05-30: qty 400

## 2024-05-30 MED ORDER — AMIODARONE HCL IN DEXTROSE 360-4.14 MG/200ML-% IV SOLN
30.0000 mg/h | INTRAVENOUS | Status: DC
  Administered 2024-05-30 – 2024-05-31 (×3): 30 mg/h via INTRAVENOUS
  Filled 2024-05-30 (×2): qty 200

## 2024-05-30 MED ORDER — SODIUM CHLORIDE 0.9% FLUSH: 3.0000 mL | Freq: Two times a day (BID) | INTRAVENOUS | Status: DC

## 2024-05-30 NOTE — Progress Notes (Addendum)
 4 Days Post-Op Procedure(s) (LRB): CORONARY ARTERY BYPASS GRAFTING (CABG) X 4, USING ENDOSCOPIC HARVESTED RIGHT GREATER SAPHENOUS VEIN (N/A) ECHOCARDIOGRAM, TRANSESOPHAGEAL, INTRAOPERATIVE (N/A) Subjective: No acute events overnight, up walking this morning Down to 2L [place] Still no bowel movement but passing gas, no nausea Lung fully expanded on CXR this morning  Objective: Vital signs in last 24 hours: Temp:  [97.7 F (36.5 C)-98.2 F (36.8 C)] 98.2 F (36.8 C) (09/27 0331) Pulse Rate:  [77-89] 88 (09/27 0700) Cardiac Rhythm: Normal sinus rhythm (09/26 2006) Resp:  [11-24] 16 (09/27 0700) BP: (97-131)/(67-93) 124/84 (09/27 0700) SpO2:  [88 %-100 %] 93 % (09/27 0600) Weight:  [92.4 kg] 92.4 kg (09/27 0600)  Hemodynamic parameters for last 24 hours:    Intake/Output from previous day: 09/26 0701 - 09/27 0700 In: 860 [P.O.:860] Out: 1785 [Urine:1650; Chest Tube:135] Intake/Output this shift: No intake/output data recorded.  Physical Exam: General - Resting comfortably in bed CV - RRR Resp - Unlabored on 2L [place]; tube placed to waterseal - not tidaling, no air leak Abd - Soft, ND/NT Ext - Mild lower extremity edema  Lab Results: Recent Labs    05/29/24 0454 05/30/24 0304  WBC 10.7* 9.1  HGB 11.5* 10.8*  HCT 35.5* 32.7*  PLT 152 197   BMET:  Recent Labs    05/29/24 0454 05/30/24 0304  NA 134* 137  K 4.5 4.1  CL 98 96*  CO2 27 31  GLUCOSE 114* 117*  BUN 36* 37*  CREATININE 1.17 1.15  CALCIUM  8.1* 8.6*    PT/INR: No results for input(s): LABPROT, INR in the last 72 hours. ABG    Component Value Date/Time   PHART 7.293 (L) 05/26/2024 1800   HCO3 23.2 05/26/2024 1800   TCO2 25 05/26/2024 1800   ACIDBASEDEF 4.0 (H) 05/26/2024 1800   O2SAT 92 05/26/2024 1800   CBG (last 3)  Recent Labs    05/29/24 1604 05/29/24 2135 05/30/24 0646  GLUCAP 158* 104* 120*    Assessment/Plan: S/P Procedure(s) (LRB): CORONARY ARTERY BYPASS GRAFTING (CABG) X 4,  USING ENDOSCOPIC HARVESTED RIGHT GREATER SAPHENOUS VEIN (N/A) ECHOCARDIOGRAM, TRANSESOPHAGEAL, INTRAOPERATIVE (N/A) POD4 s/p CABG, POD1 s/p chest tube for pneumothorax NEURO- intact  Pain control PRN CV- in SR around 80 bpm RESP- s/p chest tube on 9/26 for pneumothorax  Lung is up on suction this morning, will waterseal tube and repeat CXR at noon             Continue IS, pulm hygiene, ambulation RENAL- creatinine and lytes Ok  Weight 203 from 215 today, unsure of accuracy             Continue diuresis with torsemide  and KCl GI- tolerating regular diet   BM: None since surgery, recommended suppository and will increase bowel regimen Endo- BG well controlled Continue ISS DVT ppx - SCD + lovenox   Dispo: Transfer to floor today    LOS: 4 days    Con RAMAN Chase Arnall 05/30/2024

## 2024-05-30 NOTE — Progress Notes (Signed)
 TCTS PM Rounding Note:  Resting Comfortably  Atrial Fibrillation with RVR earlier today, chemically converted on IV Amiodarone.. currently NSR in the 70s  Vitals:   05/30/24 1700 05/30/24 1800  BP: 108/66 115/73  Pulse: 78 76  Resp: 11 12  Temp:    SpO2: 94% 95%   Can likely transfer to 4E in AM if remains in NSR  Careli Luzader, PA-C 6:11 PM 05/30/24

## 2024-05-30 NOTE — Progress Notes (Addendum)
 Brief Progress Note  Patient went into Afib while on the bedside commode.  HR initially 160-170s, now 140s.  BP stable (115/98, patient asymptomatic  Plan: - Amio bolus and infusion - K 4.1, plans for repletion - Will add on Mg level from this AM - Transfer to floor canceled, will stay in ICU for now  Con Clunes, MD Cardiothoracic Surgery Pager: [phone]

## 2024-05-31 ENCOUNTER — Inpatient Hospital Stay (HOSPITAL_COMMUNITY)

## 2024-05-31 LAB — CBC
HCT: 34.4 % — ABNORMAL LOW (ref 39.0–52.0)
Hemoglobin: 11.4 g/dL — ABNORMAL LOW (ref 13.0–17.0)
MCH: 29.6 pg (ref 26.0–34.0)
MCHC: 33.1 g/dL (ref 30.0–36.0)
MCV: 89.4 fL (ref 80.0–100.0)
Platelets: 239 K/uL (ref 150–400)
RBC: 3.85 MIL/uL — ABNORMAL LOW (ref 4.22–5.81)
RDW: 13.1 % (ref 11.5–15.5)
WBC: 7.7 K/uL (ref 4.0–10.5)
nRBC: 0 % (ref 0.0–0.2)

## 2024-05-31 LAB — BASIC METABOLIC PANEL WITH GFR
Anion gap: 9 (ref 5–15)
BUN: 31 mg/dL — ABNORMAL HIGH (ref 8–23)
CO2: 30 mmol/L (ref 22–32)
Calcium: 8.6 mg/dL — ABNORMAL LOW (ref 8.9–10.3)
Chloride: 97 mmol/L — ABNORMAL LOW (ref 98–111)
Creatinine, Ser: 1.1 mg/dL (ref 0.61–1.24)
GFR, Estimated: >60 mL/min (ref >60)
Glucose, Bld: 124 mg/dL — ABNORMAL HIGH (ref 70–99)
Potassium: 3.6 mmol/L (ref 3.5–5.1)
Sodium: 136 mmol/L (ref 135–145)

## 2024-05-31 LAB — GLUCOSE, CAPILLARY
Glucose-Capillary: 106 mg/dL — ABNORMAL HIGH (ref 70–99)
Glucose-Capillary: 105 mg/dL — ABNORMAL HIGH (ref 70–99)
Glucose-Capillary: 100 mg/dL — ABNORMAL HIGH (ref 70–99)
Glucose-Capillary: 108 mg/dL — ABNORMAL HIGH (ref 70–99)

## 2024-05-31 MED ORDER — AMIODARONE HCL 200 MG PO TABS
400.0000 mg | ORAL_TABLET | Freq: Two times a day (BID) | ORAL | Status: DC
  Administered 2024-05-31 – 2024-06-02 (×5): 400 mg via ORAL
  Filled 2024-05-31 (×5): qty 2

## 2024-05-31 MED ORDER — FUROSEMIDE 40 MG PO TABS
40.0000 mg | ORAL_TABLET | Freq: Every day | ORAL | Status: DC
  Administered 2024-05-31: 40 mg via ORAL
  Filled 2024-05-31: qty 1

## 2024-05-31 NOTE — Progress Notes (Signed)
 Patient arrived in the unit, he is alert and oriented, denies any pain/discomfort, CCMD notified, V/S obtained, all needs met, call bell in reach, family at bedside.     05/31/24 1528  Vitals  Temp 97.8 F (36.6 C)  Temp Source Oral  BP (!) 139/92  MAP (mmHg) 102  BP Location Left Arm  BP Method Automatic  Patient Position (if appropriate) Sitting  Pulse Rate 72  Pulse Rate Source Monitor  ECG Heart Rate 70  Resp 16  Level of Consciousness  Level of Consciousness Alert  MEWS COLOR  MEWS Score Color Green  Oxygen Therapy  SpO2 97 %  O2 Device Room Air  Pain Assessment  Pain Scale 0-10  Pain Score 0  MEWS Score  MEWS Temp 0  MEWS Systolic 0  MEWS Pulse 0  MEWS RR 0  MEWS LOC 0  MEWS Score 0

## 2024-05-31 NOTE — Progress Notes (Signed)
 5 Days Post-Op Procedure(s) (LRB): CORONARY ARTERY BYPASS GRAFTING (CABG) X 4, USING ENDOSCOPIC HARVESTED RIGHT GREATER SAPHENOUS VEIN (N/A) ECHOCARDIOGRAM, TRANSESOPHAGEAL, INTRAOPERATIVE (N/A) Subjective: No issues overnight Back in sinus rhythm after amio bolus - IV load ongoing Didn't sleep well Diuresing well with torsemide , below pre-op weight CXR stable this morning with lung fully expanded with tube on waterseal  Objective: Vital signs in last 24 hours: BP 123/85   Pulse 70   Temp 98.4 F (36.9 C) (Oral)   Resp 12   Ht 5' 9 (1.753 m)   Wt 93.3 kg   SpO2 97%   BMI 30.38 kg/m  Filed Weights   05/29/24 0500 05/30/24 0600 05/31/24 0500  Weight: 97.6 kg 92.4 kg 93.3 kg    Intake/Output from previous day: 09/27 0701 - 09/28 0700 In: 821 [P.O.:360; I.V.:461] Out: 2345 [Urine:2325; Chest Tube:20] Intake/Output this shift: Total I/O In: 39.6 [I.V.:39.6] Out: -   Physical Exam: General - Resting comfortably in bed CV - RRR Resp - Unlabored on 2L [place]; chest tube tidaling, no air leak Abd - Soft, ND/NT Ext - Mild edema  Lab Results:    Latest Ref Rng & Units 05/31/2024    3:18 AM 05/30/2024    3:04 AM 05/29/2024    4:54 AM  CBC  WBC 4.0 - 10.5 K/uL 7.7  9.1  10.7   Hemoglobin 13.0 - 17.0 g/dL 88.5  89.1  88.4   Hematocrit 39.0 - 52.0 % 34.4  32.7  35.5   Platelets 150 - 400 K/uL 239  197  152       Latest Ref Rng & Units 05/31/2024    3:18 AM 05/30/2024    3:04 AM 05/29/2024    4:54 AM  CMP  Glucose 70 - 99 mg/dL 875  882  885   BUN 8 - 23 mg/dL 31  37  36   Creatinine 0.61 - 1.24 mg/dL 8.89  8.84  8.82   Sodium 135 - 145 mmol/L 136  137  134   Potassium 3.5 - 5.1 mmol/L 3.6  4.1  4.5   Chloride 98 - 111 mmol/L 97  96  98   CO2 22 - 32 mmol/L 30  31  27    Calcium  8.9 - 10.3 mg/dL 8.6  8.6  8.1     CXR: Lung fully expanded with no residual pneumothorax  Assessment/Plan: S/P Procedure(s) (LRB): CORONARY ARTERY BYPASS GRAFTING (CABG) X 4, USING  ENDOSCOPIC HARVESTED RIGHT GREATER SAPHENOUS VEIN (N/A) ECHOCARDIOGRAM, TRANSESOPHAGEAL, INTRAOPERATIVE (N/A) POD 5 s/p CABG with post-op pneumothorax and Afib NEURO- intact  Pain control PRN CV- in SR around 70 bpm             Transition to PO amio RESP- Lung fully expanded on waterseal without air leak -> d/c pleural tube today             Continue IS, pulm hygiene, ambulation RENAL- creatinine and lytes Ok  Weight below pre-op weight             Start 40 PO Lasix , stop torsemide  GI- tolerating diet, having BM Endo- BG well controlled Continue ISS DVT ppx - SCD  Dispo: Transfer to 4E today    LOS: 5 days    Con RAMAN Blakeley Margraf 05/31/2024

## 2024-06-01 ENCOUNTER — Other Ambulatory Visit: Payer: Self-pay

## 2024-06-01 ENCOUNTER — Inpatient Hospital Stay (HOSPITAL_COMMUNITY)

## 2024-06-01 LAB — GLUCOSE, CAPILLARY
Glucose-Capillary: 93 mg/dL (ref 70–99)
Glucose-Capillary: 95 mg/dL (ref 70–99)
Glucose-Capillary: 110 mg/dL — ABNORMAL HIGH (ref 70–99)
Glucose-Capillary: 167 mg/dL — ABNORMAL HIGH (ref 70–99)

## 2024-06-01 MED ORDER — METOPROLOL TARTRATE 25 MG PO TABS
25.0000 mg | ORAL_TABLET | Freq: Two times a day (BID) | ORAL | Status: DC
  Administered 2024-06-01 – 2024-06-02 (×3): 25 mg via ORAL
  Filled 2024-06-01 (×3): qty 1

## 2024-06-01 NOTE — Progress Notes (Signed)
 CARDIAC REHAB PHASE I   PRE:  Rate/Rhythm: 66 SR    BP: sitting 156/106    SpO2: 97 RA  MODE:  Ambulation: 450 ft   POST:  Rate/Rhythm: 87 SR    BP: sitting 160/87     SpO2: 97 RA   Pt's back pain is less now. Able to get out of bed with min assist and verbal cues. Walked with RW, no major c/o. To recliner. BP elevated. No afib.  Discussed with pt and wife IS, sternal precautions, diet, exercise, and CRPII. Pt receptive. Will refer to [facility] CRPII.  [id number]  Aliene Aris BS, ACSM-CEP 06/01/2024 3:31 PM

## 2024-06-01 NOTE — TOC Progression Note (Signed)
 Transition of Care (TOC) - Progression Note  Rayfield Gobble RN, BSN Inpatient Care Management Unit 4E- RN Case Manager See Treatment Team for direct phone #   Patient Details  Name: Marc Myers MRN: [id number] Date of Birth: [birth year]  [facility] CM/SW Contact  Gobble Rayfield Hurst, RN Phone Number: 06/01/2024, 4:28 PM  Clinical Narrative:    CM notified by cardiac rehab that pt needs RW for home. Verbal order placed and pending co-sign.   CM spoke with pt at bedside- discussed DME- pt voiced he does not have a preference in provider. Wants delivered prior to discharge.   Also discussed TCTS office referral for [facility] needs w/ [facility]. Pt voiced he does not feel he will need HH follow up. Declines having [facility] follow up with him post discharge.   CM will follow for DME referral once order has been signed.    Expected Discharge Plan: Home w Home Health Services Barriers to Discharge: Continued Medical Work up               Expected Discharge Plan and Services In-house Referral: NA Discharge Planning Services: CM Consult Post Acute Care Choice: Home Health Living arrangements for the past 2 months: Single Family Home                 DME Arranged: Walker rolling         HH Arranged: Patient Refused [facility] HH Agency: [facility] ([facility] Date HH Agency Contacted: 05/28/24 Time HH Agency Contacted: 1608 Representative spoke with at [facility] Agency: Zebedee Braver   Social Drivers of Health (SDOH) Interventions SDOH Screenings   Food Insecurity: No Food Insecurity (05/30/2024)  Housing: Low Risk  (05/30/2024)  Transportation Needs: No Transportation Needs (05/30/2024)  Utilities: Not At Risk (05/30/2024)  Depression (PHQ2-9): Low Risk  (04/15/2024)  Social Connections: Socially Integrated (06/01/2024)  Tobacco Use: Medium Risk (05/26/2024)    Readmission Risk Interventions     No data to display

## 2024-06-01 NOTE — Progress Notes (Addendum)
   [address], [place] [address]             [phone]  6 Days Post-Op Procedure(s) (LRB): CORONARY ARTERY BYPASS GRAFTING (CABG) X 4, USING ENDOSCOPIC HARVESTED RIGHT GREATER SAPHENOUS VEIN (N/A) ECHOCARDIOGRAM, TRANSESOPHAGEAL, INTRAOPERATIVE (N/A) Subjective: Transferred from ICU yesterday.  Primary complaint is back discomfort which is chronic.   On RA. Has had a BM since surgery.  No further atrial fibrillation.  Objective: Vital signs in last 24 hours: Temp:  [96.4 F (35.8 C)-98.3 F (36.8 C)] 98 F (36.7 C) (09/29 0005) Pulse Rate:  [67-76] 75 (09/28 2014) Cardiac Rhythm: Normal sinus rhythm (09/28 1900) Resp:  [10-18] 18 (09/29 0005) BP: (116-155)/(79-97) 126/97 (09/29 0005) SpO2:  [93 %-97 %] 93 % (09/29 0005) Weight:  [92.1 kg] 92.1 kg (09/29 0500)  Hemodynamic parameters for last 24 hours:    Intake/Output from previous day: 09/28 0701 - 09/29 0700 In: 296 [P.O.:240; I.V.:56] Out: 1060 [Urine:1060] Intake/Output this shift: No intake/output data recorded.  General appearance: alert, cooperative, and no distress Neurologic: intact Heart: RRR, no further atrial fibrillation since transfer to 4E.  Lungs: breath sounds full and clear.  Abdomen: benign Extremities: no peripheral edema Wound: the sternotomy incision and RLE EVH incision are well approximated and dry.   Lab Results: Recent Labs    05/30/24 0304 05/31/24 0318  WBC 9.1 7.7  HGB 10.8* 11.4*  HCT 32.7* 34.4*  PLT 197 239   BMET:  Recent Labs    05/30/24 0304 05/31/24 0318  NA 137 136  K 4.1 3.6  CL 96* 97*  CO2 31 30  GLUCOSE 117* 124*  BUN 37* 31*  CREATININE 1.15 1.10  CALCIUM  8.6* 8.6*    PT/INR: No results for input(s): LABPROT, INR in the last 72 hours. ABG    Component Value Date/Time   PHART 7.293 (L) 05/26/2024 1800   HCO3 23.2 05/26/2024 1800   TCO2 25 05/26/2024 1800   ACIDBASEDEF 4.0 (H) 05/26/2024 1800   O2SAT 92 05/26/2024 1800    CBG (last 3)  Recent Labs    05/31/24 1609 05/31/24 2108 06/01/24 0613  GLUCAP 100* 108* 93    Assessment/Plan: S/P Procedure(s) (LRB): CORONARY ARTERY BYPASS GRAFTING (CABG) X 4, USING ENDOSCOPIC HARVESTED RIGHT GREATER SAPHENOUS VEIN (N/A) ECHOCARDIOGRAM, TRANSESOPHAGEAL, INTRAOPERATIVE (N/A)  -POD5 CABG x 4. Progressing well. MAP 100's on low-dose metoprolol . On Crestor , Zetia , Plavix  (diffuse CAD), and ASA. Will increase the metoprolol  to 25mg  BID.   -Post-op atrial fibrillation- maintaining SR on oral amiodarone. Recheck lag tomorrow morning.   -PULM- on RA. CXR showing small left effusion, ATX. Continue working on [employer].   -RENAL- normal function. Wt below pre-op and he appeare euvolemic on exam. Not on a diuretic.   -GI- tolerating diet, having normal bowel function.   -Disposition- anticipate discharge to home tomorrow if rhythm stable.    LOS: 6 days    Marc Trebilcock G. Zion Ta, PA-C'  06/01/2024

## 2024-06-01 NOTE — Plan of Care (Signed)

## 2024-06-02 ENCOUNTER — Other Ambulatory Visit (HOSPITAL_COMMUNITY): Payer: Self-pay

## 2024-06-02 LAB — BASIC METABOLIC PANEL WITH GFR
Anion gap: 12 (ref 5–15)
BUN: 23 mg/dL (ref 8–23)
CO2: 27 mmol/L (ref 22–32)
Calcium: 8.9 mg/dL (ref 8.9–10.3)
Chloride: 99 mmol/L (ref 98–111)
Creatinine, Ser: 0.94 mg/dL (ref 0.61–1.24)
GFR, Estimated: >60 mL/min (ref >60)
Glucose, Bld: 120 mg/dL — ABNORMAL HIGH (ref 70–99)
Potassium: 3.7 mmol/L (ref 3.5–5.1)
Sodium: 138 mmol/L (ref 135–145)

## 2024-06-02 LAB — GLUCOSE, CAPILLARY: Glucose-Capillary: 106 mg/dL — ABNORMAL HIGH (ref 70–99)

## 2024-06-02 LAB — MAGNESIUM: Magnesium: 1.9 mg/dL (ref 1.7–2.4)

## 2024-06-02 MED ORDER — LOSARTAN POTASSIUM 25 MG PO TABS
25.0000 mg | ORAL_TABLET | Freq: Every day | ORAL | 1 refills | Status: DC
  Filled 2024-06-02: qty 30, 30d supply, fill #0

## 2024-06-02 MED ORDER — AMIODARONE HCL 200 MG PO TABS
ORAL_TABLET | ORAL | 0 refills | Status: DC
  Filled 2024-06-02: qty 60, 32d supply, fill #0

## 2024-06-02 MED ORDER — LOSARTAN POTASSIUM 25 MG PO TABS: 25.0000 mg | ORAL_TABLET | Freq: Every day | ORAL | Status: DC

## 2024-06-02 MED ORDER — METOPROLOL TARTRATE 25 MG PO TABS
25.0000 mg | ORAL_TABLET | Freq: Two times a day (BID) | ORAL | 1 refills | Status: DC
  Filled 2024-06-02: qty 60, 30d supply, fill #0

## 2024-06-02 MED ORDER — OXYCODONE HCL 5 MG PO TABS
5.0000 mg | ORAL_TABLET | Freq: Four times a day (QID) | ORAL | 0 refills | Status: DC | PRN
  Filled 2024-06-02: qty 28, 7d supply, fill #0

## 2024-06-02 MED ORDER — CLOPIDOGREL BISULFATE 75 MG PO TABS
75.0000 mg | ORAL_TABLET | Freq: Every day | ORAL | 1 refills | Status: DC
  Filled 2024-06-02: qty 30, 30d supply, fill #0

## 2024-06-02 NOTE — Progress Notes (Signed)
      [address] [place] [address]             [phone]      7 Days Post-Op Procedure(s) (LRB): CORONARY ARTERY BYPASS GRAFTING (CABG) X 4, USING ENDOSCOPIC HARVESTED RIGHT GREATER SAPHENOUS VEIN (N/A) ECHOCARDIOGRAM, TRANSESOPHAGEAL, INTRAOPERATIVE (N/A) Subjective: Patient with no new complaints, eager to go home  Objective: Vital signs in last 24 hours: Temp:  [98 F (36.7 C)-98.7 F (37.1 C)] 98.3 F (36.8 C) (09/30 0334) Pulse Rate:  [65-81] 81 (09/30 0334) Cardiac Rhythm: Normal sinus rhythm (09/29 2014) Resp:  [14-19] 18 (09/30 0334) BP: (129-139)/(73-88) 131/84 (09/30 0334) SpO2:  [92 %-100 %] 92 % (09/30 0334) Weight:  [95.4 kg] 95.4 kg (09/30 0334)  Hemodynamic parameters for last 24 hours:    Intake/Output from previous day: No intake/output data recorded. Intake/Output this shift: No intake/output data recorded.  General appearance: alert, cooperative, and no distress Neurologic: intact Heart: regular rate and rhythm, systolic murmur LLSB Lungs: clear to auscultation bilaterally Abdomen: soft, non-tender; bowel sounds normal; no masses,  no organomegaly Extremities: extremities normal, atraumatic, no cyanosis or edema Wound: Clean and dry without sign of infection  Lab Results: Recent Labs    05/31/24 0318  WBC 7.7  HGB 11.4*  HCT 34.4*  PLT 239   BMET:  Recent Labs    05/31/24 0318 06/02/24 0344  NA 136 138  K 3.6 3.7  CL 97* 99  CO2 30 27  GLUCOSE 124* 120*  BUN 31* 23  CREATININE 1.10 0.94  CALCIUM  8.6* 8.9    PT/INR: No results for input(s): LABPROT, INR in the last 72 hours. ABG    Component Value Date/Time   PHART 7.293 (L) 05/26/2024 1800   HCO3 23.2 05/26/2024 1800   TCO2 25 05/26/2024 1800   ACIDBASEDEF 4.0 (H) 05/26/2024 1800   O2SAT 92 05/26/2024 1800   CBG (last 3)  Recent Labs    06/01/24 1626 06/01/24 2055 06/02/24 0607  GLUCAP 110* 167* 106*    Assessment/Plan: S/P  Procedure(s) (LRB): CORONARY ARTERY BYPASS GRAFTING (CABG) X 4, USING ENDOSCOPIC HARVESTED RIGHT GREATER SAPHENOUS VEIN (N/A) ECHOCARDIOGRAM, TRANSESOPHAGEAL, INTRAOPERATIVE (N/A)  CV: Hx of postop afib, NSR HR 70s this AM. On PO Amiodarone 400mg  BID and Lopressor  25mg  BID. On Plavix . SBP 130s-140s, he was on Cozaar  100mg  at bedtime prior to admission. Will restart low dose Cozaar , can titrate as an outpatient.   Pulm: Saturating well on RA. Postop pneumothorax, left chest tube has been removed. Last CXR with tiny apical pneumothorax and bibasilar atelectasis. Encourage IS and ambulation  GI: +BM, tolerating a diet  Endo: Preop A1C 5.7, CBGs controlled. Not on home meds. Will need close follow up with PCP for likely prediabetes.   Renal: Cr 0.94, stable. Weight +7lbs from yesterday, likely not correct. Has been under his preop weight for the last few days. Looks euvolemic.   Expected postop ABLA: Last H/H 11.4/34.4, not clinically significant at this time.   DVT Prophylaxis: On Plavix  and ASA  Dispo: Plan to d/c home with wife today   LOS: 7 days    Con GORMAN Bend, PA-C 06/02/2024

## 2024-06-02 NOTE — TOC Transition Note (Signed)
 Transition of Care (TOC) - Discharge Note Rayfield Gobble RN, BSN Inpatient Care Management Unit 4E- RN Case Manager See Treatment Team for direct phone #   Patient Details  Name: Marc Myers MRN: [id number] Date of Birth: 03/19/1956  Transition [facility]) CM/SW Contact:  Gobble Rayfield Hurst, RN Phone Number: 06/02/2024, 4:03 PM   Clinical Narrative:    Pt discharged prior to CM getting to unit. DME order signed by M. Roddenberry PA-  However pt left prior to f/u on DME referral for delivery.   CM reached out to pt and VM left for pt to call CM regarding RW. Will await return call to see if pt would like RW delivered to home.   [facility] liaison updated that pt had no HH needs.    Final next level of care: Home/Self Care Barriers to Discharge: Continued Medical Work up   Patient Goals and CMS Choice Patient states their goals for this hospitalization and ongoing recovery are:: Plan to return home once stable. CMS Medicare.gov Compare Post Acute Care list provided to:: Patient Represenative (must comment) Choice offered to / list presented to : Adult Children      Discharge Placement               Home        Discharge Plan and Services Additional resources added to the After Visit Summary for   In-house Referral: NA Discharge Planning Services: CM Consult Post Acute Care Choice: Home Health          DME Arranged: Walker rolling         HH Arranged: [facility] Refused [facility] ([facility] Date HH Agency Contacted: 05/28/24 Time HH Agency Contacted: 1608 Representative spoke with at [facility] Agency: Zebedee Braver  Social Drivers of Health (SDOH) Interventions SDOH Screenings   Food Insecurity: No Food Insecurity (05/30/2024)  Housing: Low Risk  (05/30/2024)  Transportation Needs: No Transportation Needs (05/30/2024)  Utilities: Not At Risk (05/30/2024)  Depression (PHQ2-9): Low Risk  (04/15/2024)  Social Connections: Socially  Integrated (06/01/2024)  Tobacco Use: Medium Risk (05/26/2024)     Readmission Risk Interventions     No data to display

## 2024-06-03 ENCOUNTER — Telehealth: Payer: Self-pay | Admitting: *Deleted

## 2024-06-03 NOTE — Transitions of Care (Post Inpatient/ED Visit) (Signed)
 06/03/2024  Name: Marc Myers MRN: [id number] DOB: 06/21/1956  Today's TOC FU Call Status: Today's TOC FU Call Status:: Successful TOC FU Call Completed TOC FU Call Complete Date: 06/03/24 Patient's Name and Date of Birth confirmed.  Transition Care Management Follow-up Telephone Call Date of Discharge: 06/02/24 Discharge Facility: [name]) Type of Discharge: Inpatient Admission Primary Inpatient Discharge Diagnosis:: S/P Cabg x 4 How have you been since you were released from the hospital?: Better Any questions or concerns?: No  Items Reviewed: Did you receive and understand the discharge instructions provided?: Yes Medications obtained,verified, and reconciled?: Yes (Medications Reviewed) Any new allergies since your discharge?: No Dietary orders reviewed?: No Do you have support at home?: Yes People in Home [RPT]: spouse Name of Support/Comfort Primary Source: Marc Myers  Medications Reviewed Today: Medications Reviewed Today     Reviewed by Marc Myers DEL, RN (Case Manager) on 06/03/24 at 1139  Med List Status: <None>   Medication Order Taking? Sig Documenting Provider Last Dose Status Informant  amiodarone (PACERONE) 200 MG tablet [id number] Yes Take 2 tablets (400 mg total) by mouth 2 (two) times daily for 7 days, THEN 1 tablet (200 mg total) 2 (two) times daily for 7 days, THEN 1 tablet (200 mg total) daily. Marc Myers Marc Myers  Active   aspirin  EC 81 MG tablet [id number] Yes Take 1 tablet (81 mg total) by mouth daily. Swallow whole. Marc Sober, NP  Active Spouse/Significant Other  clopidogrel  (PLAVIX ) 75 MG tablet [id number] Yes Take 1 tablet (75 mg total) by mouth daily. Marc Myers GORMAN, PA-C  Active   ezetimibe  (ZETIA ) 10 MG tablet [id number] Yes Take 1 tablet (10 mg total) by mouth daily. Marc Verneita CROME, MD  Active Spouse/Significant Other  losartan  (COZAAR ) 25 MG tablet [id number] Yes Take 1 tablet (25 mg total) by mouth at bedtime. Marc Myers GORMAN, PA-C  Active   metoprolol  tartrate (LOPRESSOR ) 25 MG tablet [id number] Yes Take 1 tablet (25 mg total) by mouth 2 (two) times daily. Marc Myers GORMAN, PA-C  Active   oxyCODONE  (OXY IR/ROXICODONE ) 5 MG immediate release tablet [id number] Yes Take 1 tablet (5 mg total) by mouth every 6 (six) hours as needed for severe pain (pain score 7-10). Marc Myers GORMAN, PA-C  Active   rosuvastatin  (CRESTOR ) 10 MG tablet [id number] Yes Take 1 tablet (10 mg total) by mouth daily. Marc Sober, NP  Active Spouse/Significant Other  triamcinolone  cream (KENALOG ) 0.1 % [id number] Yes Apply 1 Application topically 2 (two) times daily. To rash  Patient taking differently: Apply 1 Application topically 2 (two) times daily as needed (rash/itching).   Marc Verneita CROME, MD  Active Spouse/Significant Other            Home Care and Equipment/Supplies: Were Home Health Services Ordered?: NA Any new equipment or medical supplies ordered?: NA  Functional Questionnaire: Do you need assistance with bathing/showering or dressing?: No Do you need assistance with meal preparation?: No Do you need assistance with eating?: No Do you have difficulty maintaining continence: No Do you need assistance with getting out of bed/getting out of a chair/moving?: No Do you have difficulty managing or taking your medications?: No  Follow up appointments reviewed: PCP Follow-up appointment confirmed?: No MD Provider Line [phone] Given: Yes (Discussed with wife and she will call to make follow up appointment) Specialist Hospital Follow-up appointment confirmed?: Yes Date of Specialist follow-up appointment?: 06/10/24 Follow-Up Specialty Provider:: Marc Myers [phone] Myers Loistine Do you need transportation to your follow-up  appointment?: No Do you understand care options if your condition(s) worsen?: No-reviewed care options from [facility]  SDOH Interventions Today    Flowsheet Row Most Recent  Value  SDOH Interventions   Food Insecurity Interventions Intervention Not Indicated  Housing Interventions Intervention Not Indicated  Transportation Interventions Intervention Not Indicated, Patient Resources (Friends/Family)  Utilities Interventions Intervention Not Indicated    Goals Addressed   None   RN sent message to [facility] inpatient nurse that walker is not needed Discussed and offered 30 day TOC program.  Patient  declined.  The patient has been provided with contact information for the care management team and has been advised to call with any health -related questions or concerns.  The patient verbalized understanding with current plan of care.  The patient is directed to their insurance card regarding availability of benefits coverage.   Marc Myers BSN RN [place] [facility] Health Care Management Coordinator [email or web address] Direct Dial: [phone]  Fax: [phone] Website: [email or web address] .

## 2024-06-04 ENCOUNTER — Other Ambulatory Visit

## 2024-06-08 ENCOUNTER — Encounter: Payer: Self-pay | Admitting: Internal Medicine

## 2024-06-08 ENCOUNTER — Ambulatory Visit (INDEPENDENT_AMBULATORY_CARE_PROVIDER_SITE_OTHER): Admitting: Internal Medicine

## 2024-06-08 VITALS — BP 122/72 | HR 52 | Temp 98.5°F | Ht 69.0 in | Wt 201.0 lb

## 2024-06-08 DIAGNOSIS — E1159 Type 2 diabetes mellitus with other circulatory complications: Secondary | ICD-10-CM | POA: Diagnosis not present

## 2024-06-08 DIAGNOSIS — I4891 Unspecified atrial fibrillation: Secondary | ICD-10-CM | POA: Diagnosis not present

## 2024-06-08 DIAGNOSIS — Z951 Presence of aortocoronary bypass graft: Secondary | ICD-10-CM

## 2024-06-08 DIAGNOSIS — I25118 Atherosclerotic heart disease of native coronary artery with other forms of angina pectoris: Secondary | ICD-10-CM

## 2024-06-08 DIAGNOSIS — E782 Mixed hyperlipidemia: Secondary | ICD-10-CM

## 2024-06-08 DIAGNOSIS — I152 Hypertension secondary to endocrine disorders: Secondary | ICD-10-CM

## 2024-06-08 NOTE — Assessment & Plan Note (Signed)
-   Patient was noted to have A-fib postop after his four-vessel CABG -He reverted to normal sinus rhythm in the hospital on amiodarone -Cardiology to follow-up with him next week -Continue with amiodarone as well as Lopressor  25 mg twice daily -Patient is also on aspirin  and Plavix  post bypass will continue these as well -No further workup at this time

## 2024-06-08 NOTE — Addendum Note (Signed)
 Addendum  created 06/08/24 9347 by Leopoldo Bruckner, MD   Child order released for a procedure order, Clinical Note Signed, Intraprocedure Blocks edited, LDA created via procedure documentation, SmartForm saved

## 2024-06-08 NOTE — Assessment & Plan Note (Signed)
-   Patient with significant coronary artery disease requiring four-vessel bypass -He was discharged on Zetia  as well as rosuvastatin  10 mg daily and is tolerating this medication well -No further workup at this time

## 2024-06-08 NOTE — Assessment & Plan Note (Signed)
-   Patient was admitted to the hospital on September 23 for a CABG after cardiac catheterization on September 16 showed significant three-vessel disease -During his hospitalization he underwent a four-vessel CABG including SVG to LAD, SVG to PDA, SVG to OM and SVG to Diagonal  - Postop course was complicated by A-fib requiring amiodarone and Lopressor .  He did transition to normal sinus rhythm while in the hospital -Postop course was also complicated by left pneumothorax requiring chest tube placement.  This was removed and repeat chest x-ray showed residual tiny left apical pneumothorax. -Patient was discharged home on September 30 -Patient states that since discharge he has been improving his physical activity slowly but still feels winded with minimal activity but this is gradually improving -Will continue with Zetia , rosuvastatin , losartan  25 mg daily, metoprolol  25 milligrams twice daily as well as amiodarone -Patient has appointment to see Cardiologic surgery on Wednesday as well as cardiology next week -Patient states he has access to all his medications and is compliant with them

## 2024-06-08 NOTE — Progress Notes (Signed)
 Marc Myers                                          MRN: [id number]   06/08/2024   The [facility]BCI Quality Team Specialist reviewed this patient medical record for the purposes of chart review for care gap closure. The following were reviewed: abstraction for care gap closure-glycemic status assessment.    VBCI Quality Team

## 2024-06-08 NOTE — Anesthesia Procedure Notes (Addendum)
 Central Venous Catheter Insertion Performed by: Leopoldo Bruckner, MD, anesthesiologist Start/End9/23/2025 7:39 AM, 05/26/2024 7:49 AM Patient location: OR. Preanesthetic checklist: patient identified, IV checked, site marked, risks and benefits discussed, surgical consent, monitors and equipment checked, pre-op evaluation, timeout performed and anesthesia consent Hand hygiene performed  and maximum sterile barriers used  PA cath was placed.Swan type:thermodilution Attempts: 1 Patient tolerated the procedure well with no immediate complications.

## 2024-06-08 NOTE — Patient Instructions (Addendum)
  VISIT SUMMARY: Today, you had a follow-up appointment after your coronary artery bypass grafting (CABG) surgery. We discussed your recovery progress, current medications, and addressed your concerns about anxiety and physical activity. You are accompanied by your wife.  YOUR PLAN: -CORONARY ARTERY DISEASE STATUS POST CORONARY ARTERY BYPASS GRAFTING: You had significant blockages in your coronary arteries, which were treated with a four-vessel coronary artery bypass grafting. You are currently recovering well with no chest pain or significant shortness of breath. Continue taking Zetia , Crestor , aspirin , Plavix , losartan , and lopressor . Avoid NSAIDs like ibuprofen  due to the risk of bleeding after surgery. Use Tylenol  or oxycodone  for pain relief if needed. Follow up with your cardiothoracic surgeon and cardiologist. A chest x-ray will be done on Wednesday to check your postoperative status.  -POSTOPERATIVE ATRIAL FIBRILLATION (RESOLVED): You experienced an irregular heartbeat called atrial fibrillation after your surgery, which has now resolved. Continue taking lopressor  to help control your heart rate.  -POSTOPERATIVE PNEUMOTHORAX (RESOLVED): You had a collapsed lung (pneumothorax) after surgery, which was treated with a chest tube and has now resolved. Your lungs are clear on examination. A chest x-ray will be done on Wednesday to ensure everything is still okay.  -ANXIETY RELATED TO RECENT CARDIAC SURGERY: You are feeling anxious about your recovery and the sensations you are experiencing. It is normal to feel this way after such a major surgery. Gradually increase your physical activity as you feel able, and monitor for any concerning symptoms like chest pain or dizziness. Reassure yourself that these sensations are part of the normal recovery process.  -HYPERLIPIDEMIA: You have high cholesterol, which is being managed with Crestor  and Zetia . It is important to continue these medications to prevent  further blockages in your arteries.  -HYPERTENSION: You have high blood pressure, which is being managed with losartan . Your blood pressure is currently well-controlled, so continue taking losartan  as prescribed.  INSTRUCTIONS: Please follow up with your cardiothoracic surgeon and cardiologist as scheduled. A chest x-ray will be done on Wednesday to check your postoperative status. Continue taking your medications as prescribed and avoid NSAIDs like ibuprofen . Gradually increase your physical activity as tolerated and monitor for any concerning symptoms.  Of note, please continue with your amiodarone for your postoperative A-fib.  You are currently in a normal rhythm.  Please follow-up with us  for any questions or concerns                    Contains text generated by [employer]bridge.                                 Contains text generated by [employer]bridge.

## 2024-06-08 NOTE — Progress Notes (Signed)
 Acute Office Visit  Subjective:     Patient ID: Marc Myers, male    DOB: [birth year], 68 y.o.   MRN: [id number]  Chief Complaint  Patient presents with   Acute Visit    Hospital follow up     Discussed the use of AI scribe software for clinical note transcription with the patient, who gave verbal consent to proceed.  History of Present Illness Jefrey Raburn is a 68 year old male with coronary artery disease who presents for follow-up after coronary artery bypass grafting (CABG). He is accompanied by his wife.  Coronary artery disease and postoperative recovery - History of four-vessel coronary artery bypass grafting (CABG) performed approximately one week after cardiac catheterization for significant coronary artery blockages - Initial symptoms began a year and a half ago with dizziness during treadmill exercise, persisting and worsening over time despite normal evaluations - Coronary CT revealed plaque; subsequent catheterization identified significant blockages - Postoperative complications included atrial fibrillation and pneumothorax requiring chest tube placement - Currently recovering with improved breathing - No current chest pain or shortness of breath - Not yet returned to previous level of fitness; walking short distances and gradually increasing activity level - Patient had a transition of care call completed on October 1 - Medications were reviewed in detail with the patient and his wife today  Psychological distress and anxiety - Experiences anxiety and feelings of being overwhelmed related to recent cardiac surgery and health status - Concerned about chest sensations like indigestion, describing them as 'scary' - Expresses caution about resuming physical activity  Pain management - Prefers ibuprofen  for pain control - Avoids oxycodone  for pain    Review of Systems  Constitutional: Negative.   HENT: Negative.    Respiratory: Negative.     Cardiovascular: Negative.   Gastrointestinal: Negative.   Musculoskeletal:  Positive for back pain.  Neurological: Negative.   Psychiatric/Behavioral: Negative.          Objective:    BP 122/72   Pulse (!) 52   Temp 98.5 F (36.9 C)   Ht 5' 9 (1.753 m)   Wt 201 lb (91.2 kg)   SpO2 99%   BMI 29.68 kg/m    Physical Exam Constitutional:      Appearance: Normal appearance.  HENT:     Head: Normocephalic and atraumatic.  Cardiovascular:     Rate and Rhythm: Normal rate and regular rhythm.     Heart sounds: Normal heart sounds.     Comments: Stitches are intact and incision site appears to be healing well Pulmonary:     Effort: Pulmonary effort is normal.     Breath sounds: Normal breath sounds. No wheezing, rhonchi or rales.  Abdominal:     General: Bowel sounds are normal. There is no distension.     Palpations: Abdomen is soft.     Tenderness: There is no abdominal tenderness. There is no guarding or rebound.  Musculoskeletal:        General: No swelling or tenderness.     Right lower leg: No edema.     Left lower leg: No edema.  Neurological:     Mental Status: He is alert.  Psychiatric:        Mood and Affect: Mood normal.        Behavior: Behavior normal.     No results found for any visits on 06/08/24.      Assessment & Plan:   Problem List Items Addressed This Visit  Cardiovascular and Mediastinum   A-fib (HCC)   - Patient was noted to have A-fib postop after his four-vessel CABG -He reverted to normal sinus rhythm in the hospital on amiodarone -Cardiology to follow-up with him next week -Continue with amiodarone as well as Lopressor  25 mg twice daily -Patient is also on aspirin  and Plavix  post bypass will continue these as well -No further workup at this time      Coronary artery disease of native artery of native heart with stable angina pectoris - Primary   - Patient was admitted to the hospital on September 23 for a CABG after  cardiac catheterization on September 16 showed significant three-vessel disease -During his hospitalization he underwent a four-vessel CABG including SVG to LAD, SVG to PDA, SVG to OM and SVG to Diagonal  - Postop course was complicated by A-fib requiring amiodarone and Lopressor .  He did transition to normal sinus rhythm while in the hospital -Postop course was also complicated by left pneumothorax requiring chest tube placement.  This was removed and repeat chest x-ray showed residual tiny left apical pneumothorax. -Patient was discharged home on September 30 -Patient states that since discharge he has been improving his physical activity slowly but still feels winded with minimal activity but this is gradually improving -Will continue with Zetia , rosuvastatin , losartan  25 mg daily, metoprolol  25 milligrams twice daily as well as amiodarone -Patient has appointment to see Cardiologic surgery on Wednesday as well as cardiology next week -Patient states he has access to all his medications and is compliant with them      Hypertension associated with diabetes (HCC)   - This problem is chronic and stable -Patient blood pressure is well-controlled at 122/72 (at goal) -We will continue with losartan  25 mg daily as well as metoprolol  25 mg twice daily        Other   HLD (hyperlipidemia)   - Patient with significant coronary artery disease requiring four-vessel bypass -He was discharged on Zetia  as well as rosuvastatin  10 mg daily and is tolerating this medication well -No further workup at this time      S/P CABG x 4   - Patient's status post four-vessel CABG with SVG to LAD, SVG to PDA, SVG to OM and SVG to Diagonal  - He has follow-up with cardiothoracic surgery on Wednesday -He is compliant with all his medications currently including Zetia , rosuvastatin , losartan  as well as metoprolol  -No further workup at this time       No orders of the defined types were placed in this  encounter.   No follow-ups on file.  Latrecia Capito, MD

## 2024-06-08 NOTE — Assessment & Plan Note (Signed)
-   Patient's status post four-vessel CABG with SVG to LAD, SVG to PDA, SVG to OM and SVG to Diagonal  - He has follow-up with cardiothoracic surgery on Wednesday -He is compliant with all his medications currently including Zetia , rosuvastatin , losartan  as well as metoprolol  -No further workup at this time

## 2024-06-08 NOTE — Anesthesia Procedure Notes (Addendum)
 Central Venous Catheter Insertion Performed by: Leopoldo Bruckner, MD, anesthesiologist Start/End9/23/2025 7:39 AM, 05/26/2024 7:49 AM Patient location: OR. Preanesthetic checklist: patient identified, IV checked, site marked, risks and benefits discussed, surgical consent, monitors and equipment checked, pre-op evaluation, timeout performed and anesthesia consent Patient sedated Hand hygiene performed  and maximum sterile barriers used  Catheter size: 9 Fr MAC introducer Procedure performed using ultrasound to evaluate access site. Ultrasound Notes:relevant anatomy identified, ultrasound used to visualize needle entry, vessel patent under ultrasound and image(s) printed for medical record. Attempts: 1 Following insertion, line sutured, dressing applied and Biopatch. Post procedure assessment: blood return through all ports, free fluid flow and no air  Patient tolerated the procedure well with no immediate complications.

## 2024-06-08 NOTE — Assessment & Plan Note (Signed)
-   This problem is chronic and stable -Patient blood pressure is well-controlled at 122/72 (at goal) -We will continue with losartan  25 mg daily as well as metoprolol  25 mg twice daily

## 2024-06-09 ENCOUNTER — Other Ambulatory Visit: Payer: Self-pay | Admitting: Surgery

## 2024-06-09 DIAGNOSIS — I2510 Atherosclerotic heart disease of native coronary artery without angina pectoris: Secondary | ICD-10-CM

## 2024-06-10 ENCOUNTER — Ambulatory Visit: Payer: Self-pay

## 2024-06-10 ENCOUNTER — Ambulatory Visit (HOSPITAL_COMMUNITY)
Admission: RE | Admit: 2024-06-10 | Discharge: 2024-06-10 | Disposition: A | Discharge status: Home / Self Care | Source: Ambulatory Visit | Attending: Cardiology | Admitting: Cardiology

## 2024-06-10 VITALS — BP 138/78 | HR 55 | Resp 18 | Ht 69.0 in | Wt 199.0 lb

## 2024-06-10 DIAGNOSIS — I2510 Atherosclerotic heart disease of native coronary artery without angina pectoris: Secondary | ICD-10-CM | POA: Insufficient documentation

## 2024-06-10 DIAGNOSIS — Z951 Presence of aortocoronary bypass graft: Secondary | ICD-10-CM

## 2024-06-10 NOTE — Progress Notes (Signed)
 [address] [place] [address]             [phone]       HPI:  Marc Myers is a 68 year old man with medical history of hypertension and hyperlipidemia who returns for routine postoperative follow-up having undergone Coronary artery bypass grafting x 4 (Saphenous vein graft to the LAD, Saphenous vein graft to the diagonal 1, Saphenous vein graft to the OM 1, Saphenous vein graft to the PDA) and Endoscopic vein harvest from the right leg on 05/26/2024 with Dr. Lucas.  The patient's early postoperative recovery while in the hospital was notable for developing a left apical pneumothorax after chest tube removal.  He then had complete collapse of the left lung requiring chest tube placement. He developed atrial fibrillation with RVR and was started on IV amiodarone and was then transitioned to oral.  He remained in NSR.SABRAChest tube was able to be removed and follow up xray showed small left pleural effusion that was stable, stable bibasilar atelectasis and a tiny left apical pneumothorax.   He was stable for discharge on 06/02/2024.   Since hospital discharge the patient reports that he has been doing well.  He has been walking without chest pain or shortness of breath.  He has been having some trouble sleeping.  He wakes up frequently throughout the night due to pain.  The pain resolves with repositioning.  He has not needed to take oxycodone . He has felt anxious at times but this has been improving.   Allergies as of 06/10/2024   No Known Allergies      Medication List        Accurate as of June 10, 2024  2:53 PM. If you have any questions, ask your nurse or doctor.          STOP taking these medications    oxyCODONE  5 MG immediate release tablet Commonly known as: Oxy IR/ROXICODONE        TAKE these medications    amiodarone 200 MG tablet Commonly known as: PACERONE Take 2 tablets (400 mg total) by mouth 2 (two) times daily for 7 days,  THEN 1 tablet (200 mg total) 2 (two) times daily for 7 days, THEN 1 tablet (200 mg total) daily. Start taking on: June 02, 2024   aspirin  EC 81 MG tablet Take 1 tablet (81 mg total) by mouth daily. Swallow whole.   clopidogrel  75 MG tablet Commonly known as: PLAVIX  Take 1 tablet (75 mg total) by mouth daily.   ezetimibe  10 MG tablet Commonly known as: ZETIA  Take 1 tablet (10 mg total) by mouth daily.   losartan  25 MG tablet Commonly known as: COZAAR  Take 1 tablet (25 mg total) by mouth at bedtime.   metoprolol  tartrate 25 MG tablet Commonly known as: LOPRESSOR  Take 1 tablet (25 mg total) by mouth 2 (two) times daily.   rosuvastatin  10 MG tablet Commonly known as: CRESTOR  Take 1 tablet (10 mg total) by mouth daily.   triamcinolone  cream 0.1 % Commonly known as: KENALOG  Apply 1 Application topically 2 (two) times daily. To rash What changed:  when to take this reasons to take this additional instructions         ROS Review of Systems  Constitutional: Negative.  Negative for malaise/fatigue.  Respiratory: Negative.  Negative for cough and shortness of breath.   Cardiovascular:  Negative for chest pain and leg swelling.  Neurological: Negative.  Negative for headaches.      BP 138/78 (BP Location: Right Arm)   Pulse (!) 55   Resp 18   Ht 5' 9 (1.753 m)   Wt 199 lb (90.3 kg)   SpO2 98%   BMI 29.39 kg/m   Physical Exam Constitutional:      Appearance: Normal appearance.  HENT:     Head: Normocephalic and atraumatic.  Cardiovascular:     Rate and Rhythm: Normal rate and regular rhythm.     Heart sounds: Normal heart sounds, S1 normal and S2 normal.  Pulmonary:     Effort: Pulmonary effort is normal.     Breath sounds: Normal breath sounds.  Skin:    General: Skin is warm and dry.      Neurological:     General: No focal deficit present.     Mental Status: He is alert and oriented to person, place, and time.       Imaging: CLINICAL DATA:   Status post CABG 05/26/2024.   EXAM: CHEST - 2 VIEW   COMPARISON:  06/01/2024.   FINDINGS: Borderline cardiomegaly. Mediastinal contours are within normal limits. Status post median sternotomy and CABG. Similar bibasilar scarring/subsegmental atelectasis, more pronounced on the left. Small left pleural effusion can not be excluded. No overt pulmonary edema or focal consolidation. No pneumothorax identified. No acute osseous abnormality.   IMPRESSION: 1. Similar bibasilar scarring/subsegmental atelectasis, more pronounced on the left. Small left pleural effusion can not be excluded. 2. Borderline cardiomegaly.     Electronically Signed   By: Harrietta Sherry M.D.   On: 06/10/2024 12:44     Assessment/Plan:  S/P CABG x 4 - Reviewed today's chest x-ray. Small left pleural effusion.  He does not have symptoms at this time. We discussed that he should continue to use his incentive spirometer.  Continue to increase his activity/exercise as tolerated.  He should continue sternal precautions and refrain from lifting over 10 pounds. He should continue to keep incisions clean and dry. - He has follow-up with cardiology next week - He should follow-up with [facility] in 3 weeks with chest x-ray.  At this appointment we will discuss driving and clearance for cardiac rehab   Manuelita CHRISTELLA Rough, PA-C 2:53 PM 06/10/24

## 2024-06-10 NOTE — Patient Instructions (Signed)
-  Follow up with [facility] in 3 weeks with chest xray  You may continue to gradually increase your physical activity as tolerated.  Refrain from any heavy lifting or strenuous use of your arms and shoulders until at least 6 weeks from the time of your surgery, and avoid activities that cause increased pain in your chest on the side of your surgical incision.  Otherwise you may continue to increase activities without any particular limitations.  Increase the intensity and duration of physical activity gradually.

## 2024-06-15 NOTE — Progress Notes (Unsigned)
 Cardiology Clinic Note   Date: 06/18/2024 ID: Breckan Cafiero, DOB [birth year], MRN [id number]  Primary Cardiologist:  Evalene Lunger, MD  Chief Complaint   Taren Toops is a 68 y.o. male who presents to the clinic today for hospital follow up.   Patient Profile   Stein Windhorst is followed by Dr. Gollan for the history outlined below.      Past medical history significant for: Dyspnea. Echo 05/06/2024: EF 55 to 60%.  No RWMA.  Grade I DD.  Normal global strain.  Normal RV size/function.  Mild MR/AI. Coronary CTA 05/14/2024: Coronary calcium  score 433 (79th percentile).  Severe mixed plaque stenosis proximal LAD.  Severe mixed plaque stenosis mid LAD.  Mild mixed plaque stenosis proximal D3.  Mild mixed plaque mid LCx.  Moderate soft plaque mid OM1.  Moderate mixed plaque proximal RCA just prior to acute marginal takeoff.  Moderate soft plaque ostial marginal.  Complex soft plaque  occlusion of mid RCA cannot exclude CTO.  FFR analysis shows evidence of significant stenosis in LAD and RCA.  Mid RCA is modeled as a CTO vessel. Recommend cardiac cath. LHC 05/19/2024: Severe three-vessel CAD including long segment of moderate to severe mid LAD disease up to 80 to 90%, moderate diffuse disease of LMCA and CTO of OM1 and distal RCA.  Mid LAD #1 90%, #2 50%, #3 80%.  OM1 100% CTO.  OM1 filled by collaterals from D2.  Proximal RCA 30%.  Mid RCA 50%.  Distal RCA 100% CTO with left-to-right and right to right collaterals.  RPDA filled by collaterals from first septal.  RPAV filled by collaterals from RV branch.  Outpatient CT surgery consult recommended. 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 Ectatic thoracic aorta. Postop A-fib. Hypertension.  Hyperlipidemia. Lipid panel 04/15/2024: Direct LDL 136, HDL 50, TG 162, total 201. PSVT. 5-day ZIO 04/19/2023: HR 47 to 184 bpm, average 67 bpm.  6 runs of PSVT longest 19 beats.  Occasional PACs 2.3%.  Rare PVCs.  No  A-fib/sustained arrhythmias. Prediabetes.  In summary, patient was initially evaluated by Dr. Barbaraann on 01/23/2023 for dizziness.  He had been seen in the ED for dizziness and lightheadedness and found to be severely hypertensive.  Troponins negative x 2.  EKG normal.  He reported being very active going to the gym 5 to 6 days a week to exercise walking 3 miles on an inclined treadmill.  He denied chest pain or difficulty breathing with exertion. The morning he presented to the ED he developed lightheadedness while walking on the treadmill.  He sat down and symptoms improved.  His wife requested that he present to the ED for evaluation.  He denied palpitations during episode of dizziness.  He reported not drinking water  or eating breakfast prior to exercise.  Patient establish care with Dr. Gollan on 07/01/2023 for dizziness and PSVT.  He denied further episodes of dizziness.  No medication changes were made.  Patient was seen by PCP for routine visit.  Echo was ordered for dyspnea and demonstrated normal LV function with Grade I DD.   Patient was seen in the office on 05/12/2024 for evaluation of progressive dyspnea. He denied frank chest pain but felt there were times he had a greater awareness across his chest with the shortness of breath.  Patient reported dyspnea had progressed to the point minimal exertion caused symptoms.He was started on isosorbide  and scheduled for coronary CTA.  Coronary CTA with FFR  demonstrated evidence of significant stenosis in LAD and RCA and possible CTO of mid RCA.  He was seen in follow-up on 05/15/2024 and scheduled for LHC.  LHC demonstrated severe three-vessel CAD as detailed above and he was referred to CT surgery for outpatient consult.  He underwent CABG x 4 on 05/26/2024.  Hospital course complicated by left pneumothorax after chest tube removal requiring replacement of chest tube.  He developed postop A-fib with RVR and was started on IV amiodarone and subsequently  transition to oral amiodarone.  Chest tube was removed and follow-up x-ray showed stable small left pleural effusion, stable bibasilar atelectasis and a tiny left apical pneumothorax.  Patient was discharged on 06/02/2024.     History of Present Illness    Today, patient is accompanied by his wife. He is doing well since discharge from hospital. Patient denies shortness of breath, dyspnea on exertion, lower extremity edema, orthopnea or PND. No chest pain, pressure, or tightness. He reports mild tenderness of sternal incision with occasional pulling sensation. No redness or drainage. No palpitations. 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 He is tolerating more walking and was able to walk around his block without stopping. He does report energy is still on the low side. He is interested in attending cardiac rehab.      ROS: All other systems reviewed and are otherwise negative except as noted in History of Present Illness.  EKGs/Labs Reviewed    EKG Interpretation Date/Time:  Thursday June 18 2024 08:31:28 EDT Ventricular Rate:  52 PR Interval:  160 QRS Duration:  74 QT Interval:  460 QTC Calculation: 427 R Axis:   -15  Text Interpretation: Sinus bradycardia When compared with ECG of 30-May-2024 09:38, Sinus rhythm has replaced Atrial fibrillation Vent. rate has decreased BY  76 BPM Confirmed by Loistine Sober [phone]) on 06/18/2024 8:34:36 AM   05/22/2024: ALT 36; AST 29 06/02/2024: BUN 23; Creatinine, Ser 0.94; Potassium 3.7; Sodium 138   05/31/2024: Hemoglobin 11.4; WBC 7.7    Physical Exam    VS:  BP 108/70 (BP Location: Left Arm, Patient Position: Sitting, Cuff Size: Normal)   Pulse (!) 52   Ht 5' 9 (1.753 m)   Wt 201 lb 6 oz (91.3 kg)   SpO2 96%   BMI 29.74 kg/m  , BMI Body mass index is 29.74 kg/m.  GEN: Well nourished, well developed, in no acute distress. Neck: No JVD or carotid bruits. Cardiac:  RRR.   No murmur. No rubs or gallops.   Respiratory:  Respirations regular and unlabored. Clear to auscultation without rales, wheezing or rhonchi. GI: Soft, nontender, nondistended. Extremities: Radials/DP/PT 2+ and equal bilaterally. No clubbing or cyanosis. No edema.  Skin: Warm and dry, no rash. Midline sternal incision well approximated without tenderness, erythema, edema, or drainage.  Neuro: Strength intact.  Assessment & Plan   CAD Echo September 2025 demonstrated normal LV/RV function, Grade I DD, mild MR/AI.  LHC 05/19/2024 demonstrated severe three-vessel CAD.  S/p CABG x 4 05/26/2024.  Patient denies chest pain, Midline sternal incision is well approximated without erythema, edema, drainage or tenderness. Patient is interested in attending cardiac rehab.  - Continue aspirin , Plavix , metoprolol  (see below), rosuvastatin , Zetia .   Palpitations/PSVT/postop A-fib 5-day ZIO August 2024 demonstrated HR 47 to 184 bpm, average 67 bpm, 6 runs of PSVT longest 19 beats, occasional PACs 2.3%, rare PVCs. Patient denies palpitations. RRR on exam today.  EKG shows sinus bradycardia 52 bpm.  - Continue to monitor. - Continue amiodarone. - Decrease metoprolol  to 12.5 mg bid.    Hypertension BP today 108/70.  - Continue losartan , metoprolol  (see above).   Hyperlipidemia LDL 136 August 2025.  - Continue rosuvastatin  and Zetia . - Repeat lipid panel at follow up.  Disposition: Decrease metoprolol  to 12.5 mg bid. Return in 3 months or sooner as needed.     Cardiac Rehabilitation Eligibility Assessment  The patient is ready to start cardiac rehabilitation pending clearance from the cardiac surgeon.        Signed, Barnie HERO. Zayan Delvecchio, DNP, NP-C

## 2024-06-18 ENCOUNTER — Encounter: Payer: Self-pay | Admitting: Student

## 2024-06-18 ENCOUNTER — Ambulatory Visit: Attending: Student | Admitting: Student

## 2024-06-18 VITALS — BP 108/70 | HR 52 | Ht 69.0 in | Wt 201.4 lb

## 2024-06-18 DIAGNOSIS — I10 Essential (primary) hypertension: Secondary | ICD-10-CM

## 2024-06-18 DIAGNOSIS — I9789 Other postprocedural complications and disorders of the circulatory system, not elsewhere classified: Secondary | ICD-10-CM

## 2024-06-18 DIAGNOSIS — R002 Palpitations: Secondary | ICD-10-CM | POA: Diagnosis not present

## 2024-06-18 DIAGNOSIS — I2510 Atherosclerotic heart disease of native coronary artery without angina pectoris: Secondary | ICD-10-CM

## 2024-06-18 DIAGNOSIS — I4891 Unspecified atrial fibrillation: Secondary | ICD-10-CM | POA: Diagnosis not present

## 2024-06-18 DIAGNOSIS — E785 Hyperlipidemia, unspecified: Secondary | ICD-10-CM | POA: Diagnosis not present

## 2024-06-18 MED ORDER — METOPROLOL TARTRATE 25 MG PO TABS: 12.5000 mg | ORAL_TABLET | Freq: Two times a day (BID) | ORAL | 3 refills | Status: AC

## 2024-06-18 NOTE — Addendum Note (Signed)
 Addendum  created 06/18/24 1303 by Leopoldo Bruckner, MD   Clinical Note Signed, Intraprocedure Blocks edited, LDA updated via procedure documentation

## 2024-06-18 NOTE — Patient Instructions (Signed)
 Medication Instructions:   Your physician recommends the following medication changes.  DECREASE: Metoprolol  Tartrate (Lopressor ) 12.5 mg (1/2 of 25 mg tablet) two times daily  *If you need a refill on your cardiac medications before your next appointment, please call your pharmacy*  Lab Work:  None ordered at this time   If you have labs (blood work) drawn today and your tests are completely normal, you will receive your results only by:  MyChart Message (if you have MyChart) OR  A paper copy in the mail If you have any lab test that is abnormal or we need to change your treatment, we will call you to review the results.  Testing/Procedures:  None ordered at this time   Referrals:  None ordered at this time   Follow-Up:  At [facility], you and your health needs are our priority.  As part of our continuing mission to provide you with exceptional heart care, our providers are all part of one team.  This team includes your primary Cardiologist (physician) and Advanced Practice Providers or APPs (Physician Assistants and Nurse Practitioners) who all work together to provide you with the care you need, when you need it.  Your next appointment:   3 month(s)  Provider:    Timothy Gollan, MD or Barnie Hila, NP    We recommend signing up for the patient portal called MyChart.  Sign up information is provided on this After Visit Summary.  MyChart is used to connect with patients for Virtual Visits (Telemedicine).  Patients are able to view lab/test results, encounter notes, upcoming appointments, etc.  Non-urgent messages can be sent to your provider as well.   To learn more about what you can do with MyChart, go to [email or web address].

## 2024-06-24 ENCOUNTER — Ambulatory Visit: Admitting: Student

## 2024-06-26 ENCOUNTER — Other Ambulatory Visit: Payer: Self-pay | Admitting: Surgery

## 2024-06-26 DIAGNOSIS — Z951 Presence of aortocoronary bypass graft: Secondary | ICD-10-CM

## 2024-06-27 ENCOUNTER — Other Ambulatory Visit (HOSPITAL_COMMUNITY): Payer: Self-pay

## 2024-06-30 ENCOUNTER — Ambulatory Visit: Admitting: Cardiovascular Disease

## 2024-07-01 ENCOUNTER — Ambulatory Visit (INDEPENDENT_AMBULATORY_CARE_PROVIDER_SITE_OTHER): Payer: Self-pay

## 2024-07-01 ENCOUNTER — Ambulatory Visit (HOSPITAL_COMMUNITY)
Admission: RE | Admit: 2024-07-01 | Discharge: 2024-07-01 | Disposition: A | Discharge status: Home / Self Care | Source: Ambulatory Visit | Attending: Cardiology | Admitting: Cardiology

## 2024-07-01 VITALS — BP 124/77 | HR 58 | Resp 20 | Ht 69.0 in | Wt 206.0 lb

## 2024-07-01 DIAGNOSIS — Z951 Presence of aortocoronary bypass graft: Secondary | ICD-10-CM

## 2024-07-01 NOTE — Patient Instructions (Signed)

## 2024-07-01 NOTE — Progress Notes (Signed)
 [address] [place] [address]             [phone]       HPI:  Marc Myers is a 68 year old man with medical history of hypertension and hyperlipidemia who returns for routine postoperative follow-up having undergone Coronary artery bypass grafting x 4 (Saphenous vein graft to the LAD, Saphenous vein graft to the diagonal 1, Saphenous vein graft to the OM 1, Saphenous vein graft to the PDA) and Endoscopic vein harvest from the right leg on 05/26/2024 with Dr. Lucas.  The patient's early postoperative recovery while in the hospital was notable for developing a left apical pneumothorax after chest tube removal.  He then had complete collapse of the left lung requiring chest tube placement. He developed atrial fibrillation with RVR and was started on IV amiodarone and was then transitioned to oral.  He remained in NSR.SABRAChest tube was able to be removed and follow up xray showed small left pleural effusion that was stable, stable bibasilar atelectasis and a tiny left apical pneumothorax.   He was stable for discharge on 06/02/2024.   He presents the clinic today with his wife and reports that he has been doing well.  He still has some incisional pain and he uses tylenol  for this as needed. He has been going to work daily and has been very active. He has been walking for exercise.  He denies chest pain, shortness of breath, and lower leg swelling.    Allergies as of 07/01/2024   No Known Allergies      Medication List        Accurate as of July 01, 2024 11:20 AM. If you have any questions, ask your nurse or doctor.          amiodarone 200 MG tablet Commonly known as: PACERONE Take 2 tablets (400 mg total) by mouth 2 (two) times daily for 7 days, THEN 1 tablet (200 mg total) 2 (two) times daily for 7 days, THEN 1 tablet (200 mg total) daily. Start taking on: June 02, 2024 What changed: See the new instructions.   aspirin  EC 81 MG  tablet Take 1 tablet (81 mg total) by mouth daily. Swallow whole.   clopidogrel  75 MG tablet Commonly known as: PLAVIX  Take 1 tablet (75 mg total) by mouth daily.   ezetimibe  10 MG tablet Commonly known as: ZETIA  Take 1 tablet (10 mg total) by mouth daily.   losartan  25 MG tablet Commonly known as: COZAAR  Take 1 tablet (25 mg total) by mouth at bedtime.   metoprolol  tartrate 25 MG tablet Commonly known as: LOPRESSOR  Take 0.5 tablets (12.5 mg total) by mouth 2 (two) times daily.   rosuvastatin  10 MG tablet Commonly known as: CRESTOR  Take 1 tablet (10 mg total) by mouth daily.   triamcinolone  cream 0.1 % Commonly known as: KENALOG  Apply 1 Application topically 2 (two) times daily. To rash What changed:  when to take this reasons to take this additional instructions         ROS Review of Systems  Constitutional: Negative.  Negative for malaise/fatigue.  Respiratory: Negative.  Negative for cough and shortness of breath.   Cardiovascular:  Negative for chest pain and leg swelling.  Neurological: Negative.  Negative for headaches.      BP 124/77   Pulse (!) 58   Resp 20   Ht 5' 9 (1.753 m)   Wt 206 lb (  93.4 kg)   SpO2 95% Comment: RA  BMI 30.42 kg/m   Physical Exam Constitutional:      Appearance: Normal appearance.  HENT:     Head: Normocephalic and atraumatic.  Cardiovascular:     Rate and Rhythm: Normal rate and regular rhythm.     Heart sounds: Normal heart sounds, S1 normal and S2 normal.  Pulmonary:     Effort: Pulmonary effort is normal.     Breath sounds: Normal breath sounds.  Skin:    General: Skin is warm and dry.      Neurological:     General: No focal deficit present.     Mental Status: He is alert and oriented to person, place, and time.       Imaging:  CLINICAL DATA:  CABG.   EXAM: CHEST - 2 VIEW   COMPARISON:  06/10/2024   FINDINGS: Sternotomy wires unchanged. Lungs are hypoinflated without focal lobar consolidation  or effusion. Slightly more prominent linear atelectasis/scarring over the anterior upper lungs on the lateral film. Cardiomediastinal silhouette and remainder of the exam is unchanged.   IMPRESSION: Hypoinflation without acute cardiopulmonary disease.     Electronically Signed   By: Toribio Agreste M.D.   On: 07/01/2024 11:28  Assessment/Plan:  S/P CABG x 4 -We reviewed today's chest x ray.  Continue to use incentive spirometer - We discussed driving and he is able to start since he is no longer requiring narcotics for pain.  First time driving should be a short distance at the daytime and he can slowly increase from there. -He is cleared from a surgical standpoint to participate in cardiac rehab -Discussed continuance of sternal precautions until a full 6 weeks from surgery.  Continue to avoid heavy lifting until a full 3 months from surgery -He should slowly increase his exercise/activity as tolerated -He has been seen by cardiology.  He is to continue with amiodarone at this time.  Metoprolol  was decreased to 12.5 mg twice daily.  He is to continue to use losartan , rosuvastatin , Zetia , Plavix  and aspirin . -Follow up with [facility] as needed  Manuelita CHRISTELLA Rough, PA-C 11:20 AM 07/01/24

## 2024-07-06 ENCOUNTER — Other Ambulatory Visit: Payer: Self-pay | Admitting: Internal Medicine

## 2024-07-21 ENCOUNTER — Other Ambulatory Visit: Payer: Self-pay | Admitting: Internal Medicine

## 2024-07-21 MED ORDER — LOSARTAN POTASSIUM 25 MG PO TABS: 25.0000 mg | ORAL_TABLET | Freq: Every day | ORAL | 0 refills | Status: AC

## 2024-07-21 NOTE — Telephone Encounter (Signed)
 Copied from CRM [id number]. Topic: Clinical - Medication Refill >> Jul 21, 2024  9:08 AM Carlyon D wrote: Medication: losartan  (COZAAR ) 25 MG tablet 100 day supply   Has the patient contacted their pharmacy? Yes (Agent: If no, request that the patient contact the pharmacy for the refill. If patient does not wish to contact the pharmacy document the reason why and proceed with request.) (Agent: If yes, when and what did the pharmacy advise?)  This is the patient's preferred pharmacy:  [facility] [id number] [place], [place] - [address] [place] [address] Phone: [phone] Fax: [phone]  Is this the correct pharmacy for this prescription? Yes If no, delete pharmacy and type the correct one.   Has the prescription been filled recently? Yes  Is the patient out of the medication? No, 5 days left   Has the patient been seen for an appointment in the last year OR does the patient have an upcoming appointment? Yes  Can we respond through MyChart? No prefers phone call   Agent: Please be advised that Rx refills may take up to 3 business days. We ask that you follow-up with your pharmacy.

## 2024-07-24 ENCOUNTER — Other Ambulatory Visit: Payer: Self-pay | Admitting: Physician Assistant

## 2024-07-27 ENCOUNTER — Other Ambulatory Visit: Payer: Self-pay | Admitting: Physician Assistant

## 2024-07-27 ENCOUNTER — Encounter: Attending: Cardiovascular Disease

## 2024-07-27 VITALS — Ht 69.25 in | Wt 213.9 lb

## 2024-07-27 DIAGNOSIS — Z951 Presence of aortocoronary bypass graft: Secondary | ICD-10-CM | POA: Insufficient documentation

## 2024-07-27 DIAGNOSIS — Z48812 Encounter for surgical aftercare following surgery on the circulatory system: Secondary | ICD-10-CM | POA: Diagnosis present

## 2024-07-27 NOTE — Patient Instructions (Signed)
 Patient Instructions  Patient Details  Name: Marc Myers MRN: [id number] Date of Birth: [birth year] Referring Provider:  Perla Evalene PARAS, MD  Below are your personal goals for exercise, nutrition, and risk factors. Our goal is to help you stay on track towards obtaining and maintaining these goals. We will be discussing your progress on these goals with you throughout the program.  Initial Exercise Prescription:  Initial Exercise Prescription - 07/27/24 1100       Date of Initial Exercise RX and Referring Provider   Date 07/27/24    Referring Provider Dr. Timothy Gollan, MD      Oxygen   Maintain Oxygen Saturation 88% or higher      Treadmill   MPH 2.5    Grade 0    Minutes 15    METs 2.91      REL-XR   Level 3    Speed 50    Minutes 15    METs 2.89      T5 Nustep   Level 2    SPM 80    Minutes 15    METs 2.89      Prescription Details   Frequency (times per week) 3    Duration Progress to 30 minutes of continuous aerobic without signs/symptoms of physical distress      Intensity   THRR 40-80% of Max Heartrate 95-134    Ratings of Perceived Exertion 11-13    Perceived Dyspnea 0-4      Progression   Progression Continue to progress workloads to maintain intensity without signs/symptoms of physical distress.      Resistance Training   Training Prescription Yes    Weight 5 lb    Reps 10-15          Exercise Goals: Frequency: Be able to perform aerobic exercise two to three times per week in program working toward 2-5 days per week of home exercise.  Intensity: Work with a perceived exertion of 11 (fairly light) - 15 (hard) while following your exercise prescription.  We will make changes to your prescription with you as you progress through the program.   Duration: Be able to do 30 to 45 minutes of continuous aerobic exercise in addition to a 5 minute warm-up and a 5 minute cool-down routine.   Nutrition Goals: Your personal nutrition goals will be  established when you do your nutrition analysis with the dietician.  The following are general nutrition guidelines to follow: Cholesterol < 200mg /day Sodium < 1500mg /day Fiber: Men over 50 yrs - 30 grams per day  Personal Goals:  Personal Goals and Risk Factors at Admission - 07/27/24 1020       Core Components/Risk Factors/Patient Goals on Admission    Weight Management Yes;Weight Loss    Intervention Weight Management: Develop a combined nutrition and exercise program designed to reach desired caloric intake, while maintaining appropriate intake of nutrient and fiber, sodium and fats, and appropriate energy expenditure required for the weight goal.;Weight Management: Provide education and appropriate resources to help participant work on and attain dietary goals.    Goal Weight: Long Term 190 lb (86.2 kg)    Expected Outcomes Long Term: Adherence to nutrition and physical activity/exercise program aimed toward attainment of established weight goal;Short Term: Continue to assess and modify interventions until short term weight is achieved;Weight Loss: Understanding of general recommendations for a balanced deficit meal plan, which promotes 1-2 lb weight loss per week and includes a negative energy balance of [phone] kcal/d;Understanding recommendations for meals  to include 15-35% energy as protein, 25-35% energy from fat, 35-60% energy from carbohydrates, less than 200mg  of dietary cholesterol, 20-35 gm of total fiber daily;Understanding of distribution of calorie intake throughout the day with the consumption of 4-5 meals/snacks    Diabetes --   pre   Hypertension Yes    Intervention Provide education on lifestyle modifcations including regular physical activity/exercise, weight management, moderate sodium restriction and increased consumption of fresh fruit, vegetables, and low fat dairy, alcohol moderation, and smoking cessation.;Monitor prescription use compliance.    Expected Outcomes  Short Term: Continued assessment and intervention until BP is < 140/42mm HG in hypertensive participants. < 130/48mm HG in hypertensive participants with diabetes, heart failure or chronic kidney disease.;Long Term: Maintenance of blood pressure at goal levels.    Lipids Yes    Intervention Provide education and support for participant on nutrition & aerobic/resistive exercise along with prescribed medications to achieve LDL 70mg , HDL >40mg .    Expected Outcomes Short Term: Participant states understanding of desired cholesterol values and is compliant with medications prescribed. Participant is following exercise prescription and nutrition guidelines.;Long Term: Cholesterol controlled with medications as prescribed, with individualized exercise RX and with personalized nutrition plan. Value goals: LDL < 70mg , HDL > 40 mg.         Exercise Goals and Review:  Exercise Goals     Row Name 07/27/24 1149             Exercise Goals   Increase Physical Activity Yes       Intervention Provide advice, education, support and counseling about physical activity/exercise needs.;Develop an individualized exercise prescription for aerobic and resistive training based on initial evaluation findings, risk stratification, comorbidities and participant's personal goals.       Expected Outcomes Short Term: Attend rehab on a regular basis to increase amount of physical activity.;Long Term: Add in home exercise to make exercise part of routine and to increase amount of physical activity.;Long Term: Exercising regularly at least 3-5 days a week.       Increase Strength and Stamina Yes       Intervention Develop an individualized exercise prescription for aerobic and resistive training based on initial evaluation findings, risk stratification, comorbidities and participant's personal goals.;Provide advice, education, support and counseling about physical activity/exercise needs.       Expected Outcomes Short Term:  Increase workloads from initial exercise prescription for resistance, speed, and METs.;Short Term: Perform resistance training exercises routinely during rehab and add in resistance training at home;Long Term: Improve cardiorespiratory fitness, muscular endurance and strength as measured by increased METs and functional capacity ( )       Able to understand and use rate of perceived exertion (RPE) scale Yes       Intervention Provide education and explanation on how to use RPE scale       Expected Outcomes Short Term: Able to use RPE daily in rehab to express subjective intensity level;Long Term:  Able to use RPE to guide intensity level when exercising independently       Able to understand and use Dyspnea scale Yes       Intervention Provide education and explanation on how to use Dyspnea scale       Expected Outcomes Short Term: Able to use Dyspnea scale daily in rehab to express subjective sense of shortness of breath during exertion;Long Term: Able to use Dyspnea scale to guide intensity level when exercising independently       Knowledge and understanding of  Target Heart Rate Range (THRR) Yes       Intervention Provide education and explanation of THRR including how the numbers were predicted and where they are located for reference       Expected Outcomes Long Term: Able to use THRR to govern intensity when exercising independently;Short Term: Able to state/look up THRR;Short Term: Able to use daily as guideline for intensity in rehab       Able to check pulse independently Yes       Intervention Review the importance of being able to check your own pulse for safety during independent exercise;Provide education and demonstration on how to check pulse in carotid and radial arteries.       Expected Outcomes Long Term: Able to check pulse independently and accurately;Short Term: Able to explain why pulse checking is important during independent exercise       Understanding of Exercise  Prescription Yes       Intervention Provide education, explanation, and written materials on patient's individual exercise prescription       Expected Outcomes Short Term: Able to explain program exercise prescription;Long Term: Able to explain home exercise prescription to exercise independently

## 2024-07-27 NOTE — Progress Notes (Signed)
 Assessment start time: 10:37 AM  Digestive issues/concerns: no known food allergies   24-hours Recall: B: 2 cups of black coffee, atkins shake L: healthy choice frozen meal D: chicken and veggies  Beverages 2% milk (gallon in ~5days),    Education r/t nutrition plan Patient drinking water  and likes milk. Drinks a gallon in ~5 days. Says he thinks its 2%. Spoke with him about fat content in milk and encouraged him try a lower percent milk. He says he has been eating poorly as of late because of all the stress and busy appointments. But he is looking to get back to normal where he was more on top of eating better. Reviewed Mediterranean diet handout. Educated on type of fats, sources and how to read labels. Brainstormed a few meal ideas to help eat more balanced meals with less sodium and saturated fat   Goal 1: Read labels and reduce sodium intake to below 2300mg . Ideally 1500mg  per day.  Goal 2: Reduce saturated fat, less than 12g per day. Replace bad fats for more heart healthy fats.  Goal 3: Eat 15-30gProtein and 30-60gCarbs at each meal.  End time 10:54 AM

## 2024-07-27 NOTE — Progress Notes (Signed)
 Cardiac Individual Treatment Plan  Patient Details  Name: Everet Flagg MRN: [id number] Date of Birth: 1956/02/12 Referring Provider:   Flowsheet Row Cardiac Rehab from 07/27/2024 in [facility] Cardiac and Pulmonary Rehab  Referring Provider Dr. Evalene Lunger, MD    Initial Encounter Date:  Flowsheet Row Cardiac Rehab from 07/27/2024 in [facility] Cardiac and Pulmonary Rehab  Date 07/27/24    Visit Diagnosis: S/P CABG x 4  Patient's Home Medications on Admission:  Current Outpatient Medications:    aspirin  EC 81 MG tablet, Take 1 tablet (81 mg total) by mouth daily. Swallow whole., Disp: , Rfl:    clopidogrel  (PLAVIX ) 75 MG tablet, Take 1 tablet (75 mg total) by mouth daily., Disp: 30 tablet, Rfl: 1   ezetimibe  (ZETIA ) 10 MG tablet, Take 1 tablet (10 mg total) by mouth daily., Disp: 90 tablet, Rfl: 1   losartan  (COZAAR ) 25 MG tablet, Take 1 tablet (25 mg total) by mouth at bedtime., Disp: 100 tablet, Rfl: 0   metoprolol  tartrate (LOPRESSOR ) 25 MG tablet, Take 0.5 tablets (12.5 mg total) by mouth 2 (two) times daily., Disp: 90 tablet, Rfl: 3   amiodarone  (PACERONE ) 200 MG tablet, Take 2 tablets (400 mg total) by mouth 2 (two) times daily for 7 days, THEN 1 tablet (200 mg total) 2 (two) times daily for 7 days, THEN 1 tablet (200 mg total) daily. (Patient taking differently: Taking 1 tablet by mouth once daily.), Disp: 90 tablet, Rfl: 0   rosuvastatin  (CRESTOR ) 10 MG tablet, Take 1 tablet (10 mg total) by mouth daily., Disp: 90 tablet, Rfl: 3   triamcinolone  cream (KENALOG ) 0.1 %, Apply 1 Application topically 2 (two) times daily. To rash (Patient taking differently: Apply 1 Application topically 2 (two) times daily as needed (rash/itching).), Disp: 30 g, Rfl: 0  Past Medical History: Past Medical History:  Diagnosis Date   Anxiety    Blood in stool    on and off since 2014    COVID-19    2021 and 11 or 08/2021   Dyspnea    with activity   Elevated blood pressure reading    Fatty liver     Heart murmur    Hyperlipidemia    Hypertension    Pre-diabetes     Tobacco Use: Social History   Tobacco Use  Smoking Status Former   Types: Cigarettes  Smokeless Tobacco Never  Tobacco Comments   light     Labs: Review Flowsheet  More data exists      Latest Ref Rng & Units 03/13/2023 01/09/2024 04/15/2024 05/22/2024 05/26/2024  Labs for ITP Cardiac and Pulmonary Rehab  Cholestrol 0 - 200 mg/dL 771  799  798  - -  LDL (calc) 0 - 99 mg/dL 843  872  880  - -  Direct LDL mg/dL - 865.9  863.9  - -  HDL-C >39.00 mg/dL 53.89  50.69  50.09  - -  Trlycerides 0.0 - 149.0 mg/dL 869.9  880.9  837.9  - -  Hemoglobin A1c 4.8 - 5.6 % - 6.5  6.4  5.7  -  PH, Arterial 7.35 - 7.45 - - - - 7.293  7.272  7.358  7.421  7.301  7.343   PCO2 arterial 32 - 48 mmHg - - - - 47.9  49.2  41.6  35.1  37.7  43.1   Bicarbonate 20.0 - 28.0 mmol/L - - - - 23.2  22.8  23.6  22.8  18.6  23.4   TCO2 22 - 32  mmol/L - - - - 25  24  25  23  24  25  27  29  20  25  26  25    Acid-base deficit 0.0 - 2.0 mmol/L - - - - 4.0  4.0  2.0  1.0  7.0  2.0   O2 Saturation % - - - - 92  94  96  99  100  100     Details       Multiple values from one day are sorted in reverse-chronological order          Exercise Target Goals: Exercise Program Goal: Individual exercise prescription set using results from initial 6 min walk test and THRR while considering  patient's activity barriers and safety.   Exercise Prescription Goal: Initial exercise prescription builds to 30-45 minutes a day of aerobic activity, 2-3 days per week.  Home exercise guidelines will be given to patient during program as part of exercise prescription that the participant will acknowledge.   Education: Aerobic Exercise: - Group verbal and visual presentation on the components of exercise prescription. Introduces F.I.T.T principle from ACSM for exercise prescriptions.  Reviews F.I.T.T. principles of aerobic exercise including progression. Written  material provided at class time.   Education: Resistance Exercise: - Group verbal and visual presentation on the components of exercise prescription. Introduces F.I.T.T principle from ACSM for exercise prescriptions  Reviews F.I.T.T. principles of resistance exercise including progression. Written material provided at class time.    Education: Exercise & Equipment Safety: - Individual verbal instruction and demonstration of equipment use and safety with use of the equipment. Flowsheet Row Cardiac Rehab from 07/27/2024 in [facility] Cardiac and Pulmonary Rehab  Date 07/27/24  Educator [facility]  Instruction Review Code 1- Verbalizes Understanding    Education: Exercise Physiology & General Exercise Guidelines: - Group verbal and written instruction with models to review the exercise physiology of the cardiovascular system and associated critical values. Provides general exercise guidelines with specific guidelines to those with heart or lung disease. Written material provided at class time.   Education: Flexibility, Balance, Mind/Body Relaxation: - Group verbal and visual presentation with interactive activity on the components of exercise prescription. Introduces F.I.T.T principle from ACSM for exercise prescriptions. Reviews F.I.T.T. principles of flexibility and balance exercise training including progression. Also discusses the mind body connection.  Reviews various relaxation techniques to help reduce and manage stress (i.e. Deep breathing, progressive muscle relaxation, and visualization). Balance handout provided to take home. Written material provided at class time.   Activity Barriers & Risk Stratification:  Activity Barriers & Cardiac Risk Stratification - 07/27/24 1149       Activity Barriers & Cardiac Risk Stratification   Activity Barriers Joint Problems    Cardiac Risk Stratification High          6 Minute Walk:  6 Minute Walk     Row Name 07/27/24 1035         6 Minute Walk    Phase Initial     Distance 1355 feet     Walk Time 6 minutes     # of Rest Breaks 0     MPH 2.57     METS 2.89     RPE 9     Perceived Dyspnea  0     VO2 Peak 10.13     Symptoms No     Resting HR 57 bpm     Resting BP 148/82     Resting Oxygen Saturation  96 %  Exercise Oxygen Saturation  during 6 min walk 95 %     Max Ex. HR 75 bpm     Max Ex. BP 154/84     2 Minute Post BP 152/84        Oxygen Initial Assessment:   Oxygen Re-Evaluation:   Oxygen Discharge (Final Oxygen Re-Evaluation):   Initial Exercise Prescription:  Initial Exercise Prescription - 07/27/24 1100       Date of Initial Exercise RX and Referring Provider   Date 07/27/24    Referring Provider Dr. Timothy Gollan, MD      Oxygen   Maintain Oxygen Saturation 88% or higher      Treadmill   MPH 2.5    Grade 0    Minutes 15    METs 2.91      REL-XR   Level 3    Speed 50    Minutes 15    METs 2.89      T5 Nustep   Level 2    SPM 80    Minutes 15    METs 2.89      Prescription Details   Frequency (times per week) 3    Duration Progress to 30 minutes of continuous aerobic without signs/symptoms of physical distress      Intensity   THRR 40-80% of Max Heartrate 95-134    Ratings of Perceived Exertion 11-13    Perceived Dyspnea 0-4      Progression   Progression Continue to progress workloads to maintain intensity without signs/symptoms of physical distress.      Resistance Training   Training Prescription Yes    Weight 5 lb    Reps 10-15          Perform Capillary Blood Glucose checks as needed.  Exercise Prescription Changes:   Exercise Prescription Changes     Row Name 07/27/24 1100             Response to Exercise   Blood Pressure (Admit) 148/82       Blood Pressure (Exercise) 154/84       Blood Pressure (Exit) 152/84       Heart Rate (Admit) 57 bpm       Heart Rate (Exercise) 75 bpm       Heart Rate (Exit) 60 bpm       Oxygen Saturation (Admit) 96 %        Oxygen Saturation (Exercise) 95 %       Rating of Perceived Exertion (Exercise) 9       Perceived Dyspnea (Exercise) 0       Symptoms none       Comments Results          Exercise Comments:   Exercise Goals and Review:   Exercise Goals     Row Name 07/27/24 1149             Exercise Goals   Increase Physical Activity Yes       Intervention Provide advice, education, support and counseling about physical activity/exercise needs.;Develop an individualized exercise prescription for aerobic and resistive training based on initial evaluation findings, risk stratification, comorbidities and participant's personal goals.       Expected Outcomes Short Term: Attend rehab on a regular basis to increase amount of physical activity.;Long Term: Add in home exercise to make exercise part of routine and to increase amount of physical activity.;Long Term: Exercising regularly at least 3-5 days a week.       Increase  Strength and Stamina Yes       Intervention Develop an individualized exercise prescription for aerobic and resistive training based on initial evaluation findings, risk stratification, comorbidities and participant's personal goals.;Provide advice, education, support and counseling about physical activity/exercise needs.       Expected Outcomes Short Term: Increase workloads from initial exercise prescription for resistance, speed, and METs.;Short Term: Perform resistance training exercises routinely during rehab and add in resistance training at home;Long Term: Improve cardiorespiratory fitness, muscular endurance and strength as measured by increased METs and functional capacity ( )       Able to understand and use rate of perceived exertion (RPE) scale Yes       Intervention Provide education and explanation on how to use RPE scale       Expected Outcomes Short Term: Able to use RPE daily in rehab to express subjective intensity level;Long Term:  Able to use RPE to guide  intensity level when exercising independently       Able to understand and use Dyspnea scale Yes       Intervention Provide education and explanation on how to use Dyspnea scale       Expected Outcomes Short Term: Able to use Dyspnea scale daily in rehab to express subjective sense of shortness of breath during exertion;Long Term: Able to use Dyspnea scale to guide intensity level when exercising independently       Knowledge and understanding of Target Heart Rate Range (THRR) Yes       Intervention Provide education and explanation of THRR including how the numbers were predicted and where they are located for reference       Expected Outcomes Long Term: Able to use THRR to govern intensity when exercising independently;Short Term: Able to state/look up THRR;Short Term: Able to use daily as guideline for intensity in rehab       Able to check pulse independently Yes       Intervention Review the importance of being able to check your own pulse for safety during independent exercise;Provide education and demonstration on how to check pulse in carotid and radial arteries.       Expected Outcomes Long Term: Able to check pulse independently and accurately;Short Term: Able to explain why pulse checking is important during independent exercise       Understanding of Exercise Prescription Yes       Intervention Provide education, explanation, and written materials on patient's individual exercise prescription       Expected Outcomes Short Term: Able to explain program exercise prescription;Long Term: Able to explain home exercise prescription to exercise independently          Exercise Goals Re-Evaluation :   Discharge Exercise Prescription (Final Exercise Prescription Changes):  Exercise Prescription Changes - 07/27/24 1100       Response to Exercise   Blood Pressure (Admit) 148/82    Blood Pressure (Exercise) 154/84    Blood Pressure (Exit) 152/84    Heart Rate (Admit) 57 bpm    Heart Rate  (Exercise) 75 bpm    Heart Rate (Exit) 60 bpm    Oxygen Saturation (Admit) 96 %    Oxygen Saturation (Exercise) 95 %    Rating of Perceived Exertion (Exercise) 9    Perceived Dyspnea (Exercise) 0    Symptoms none    Comments Results          Nutrition:  Target Goals: Understanding of nutrition guidelines, daily intake of sodium 1500mg , cholesterol 200mg , calories  30% from fat and 7% or less from saturated fats, daily to have 5 or more servings of fruits and vegetables.  Education: Nutrition 1 -Group instruction provided by verbal, written material, interactive activities, discussions, models, and posters to present general guidelines for heart healthy nutrition including macronutrients, label reading, and promoting whole foods over processed counterparts. Education serves as pensions consultant of discussion of heart healthy eating for all. Written material provided at class time.    Education: Nutrition 2 -Group instruction provided by verbal, written material, interactive activities, discussions, models, and posters to present general guidelines for heart healthy nutrition including sodium, cholesterol, and saturated fat. Providing guidance of habit forming to improve blood pressure, cholesterol, and body weight. Written material provided at class time.     Biometrics:  Pre Biometrics - 07/27/24 1150       Pre Biometrics   Height 5' 9.25 (1.759 m)    Weight 213 lb 14.4 oz (97 kg)    Waist Circumference 42 inches    Hip Circumference 41 inches    Waist to Hip Ratio 1.02 %    BMI (Calculated) 31.36    Single Leg Stand 30 seconds           Nutrition Therapy Plan and Nutrition Goals:  Nutrition Therapy & Goals - 07/27/24 1108       Nutrition Therapy   Diet Cardiac, Low Na    Protein (specify units) 90    Fiber 30 grams    Whole Grain Foods 3 servings    Saturated Fats 15 max. grams    Fruits and Vegetables 5 servings/day    Sodium 2 grams      Personal Nutrition  Goals   Nutrition Goal Read labels and reduce sodium intake to below 2300mg . Ideally 1500mg  per day.    Personal Goal #2 Reduce saturated fat, less than 12g per day. Replace bad fats for more heart healthy fats.    Personal Goal #3 Eat 15-30gProtein and 30-60gCarbs at each meal.    Comments Patient drinking water  and likes milk. Drinks a gallon in ~5 days. Says he thinks its 2%. Spoke with him about fat content in milk and encouraged him try a lower percent milk. He says he has been eating poorly as of late because of all the stress and busy appointments. But he is looking to get back to normal where he was more on top of eating better. Reviewed Mediterranean diet handout. Educated on type of fats, sources and how to read labels. Brainstormed a few meal ideas to help eat more balanced meals with less sodium and saturated fat      Intervention Plan   Intervention Prescribe, educate and counsel regarding individualized specific dietary modifications aiming towards targeted core components such as weight, hypertension, lipid management, diabetes, heart failure and other comorbidities.;Nutrition handout(s) given to patient.    Expected Outcomes Short Term Goal: Understand basic principles of dietary content, such as calories, fat, sodium, cholesterol and nutrients.;Short Term Goal: A plan has been developed with personal nutrition goals set during dietitian appointment.;Long Term Goal: Adherence to prescribed nutrition plan.          Nutrition Assessments:  MEDIFICTS Score Key: >=70 Need to make dietary changes  40-70 Heart Healthy Diet <= 40 Therapeutic Level Cholesterol Diet   Picture Your Plate Scores: <59 Unhealthy dietary pattern with much room for improvement. 41-50 Dietary pattern unlikely to meet recommendations for good health and room for improvement. 51-60 More healthful dietary pattern, with some room  for improvement.  >60 Healthy dietary pattern, although there may be some specific  behaviors that could be improved.    Nutrition Goals Re-Evaluation:   Nutrition Goals Discharge (Final Nutrition Goals Re-Evaluation):   Psychosocial: Target Goals: Acknowledge presence or absence of significant depression and/or stress, maximize coping skills, provide positive support system. Participant is able to verbalize types and ability to use techniques and skills needed for reducing stress and depression.   Education: Stress, Anxiety, and Depression - Group verbal and visual presentation to define topics covered.  Reviews how body is impacted by stress, anxiety, and depression.  Also discusses healthy ways to reduce stress and to treat/manage anxiety and depression. Written material provided at class time.   Education: Sleep Hygiene -Provides group verbal and written instruction about how sleep can affect your health.  Define sleep hygiene, discuss sleep cycles and impact of sleep habits. Review good sleep hygiene tips.   Initial Review & Psychosocial Screening:  Initial Psych Review & Screening - 07/27/24 1055       Initial Review   Current issues with Current Stress Concerns      Family Dynamics   Good Support System? Yes      Barriers   Psychosocial barriers to participate in program There are no identifiable barriers or psychosocial needs.      Screening Interventions   Interventions Encouraged to exercise;Provide feedback about the scores to participant;To provide support and resources with identified psychosocial needs    Expected Outcomes Short Term goal: Utilizing psychosocial counselor, staff and physician to assist with identification of specific Stressors or current issues interfering with healing process. Setting desired goal for each stressor or current issue identified.;Long Term Goal: Stressors or current issues are controlled or eliminated.;Short Term goal: Identification and review with participant of any Quality of Life or Depression concerns found by  scoring the questionnaire.;Long Term goal: The participant improves quality of Life and PHQ9 Scores as seen by post scores and/or verbalization of changes          Quality of Life Scores:   Scores of 19 and below usually indicate a poorer quality of life in these areas.  A difference of  2-3 points is a clinically meaningful difference.  A difference of 2-3 points in the total score of the Quality of Life Index has been associated with significant improvement in overall quality of life, self-image, physical symptoms, and general health in studies assessing change in quality of life.  PHQ-9: Review Flowsheet  More data exists      07/27/2024 06/08/2024 06/03/2024 04/15/2024 01/09/2024  Depression screen PHQ 2/9  Decreased Interest 0 0 0 0 0  Down, Depressed, Hopeless 0 0 0 0 0  PHQ - 2 Score 0 0 0 0 0  Altered sleeping 2 0 - - -  Tired, decreased energy 0 0 - - -  Change in appetite 0 0 - - -  Feeling bad or failure about yourself  0 0 - - -  Trouble concentrating 0 0 - - -  Moving slowly or fidgety/restless 0 0 - - -  Suicidal thoughts 0 0 - - -  PHQ-9 Score 2 0  - - -  Difficult doing work/chores Not difficult at all Not difficult at all - - -    Details       Data saved with a previous flowsheet row definition        Interpretation of Total Score  Total Score Depression Severity:  1-4 = Minimal  depression, 5-9 = Mild depression, 10-14 = Moderate depression, 15-19 = Moderately severe depression, 20-27 = Severe depression   Psychosocial Evaluation and Intervention:  Psychosocial Evaluation - 07/27/24 1056       Psychosocial Evaluation & Interventions   Interventions Encouraged to exercise with the program and follow exercise prescription    Comments Mr. Kabel is coming to cardiac rehab post CABG. He states this came as a surprise to him since he doesn't have a history of heart issues. He has been working with doctors for a year and half trying to figure out why he was  having some symptoms and it was revealed after the most recent test that he needed surgery and it was his heart. 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 He notes it has been some stress navigating this big life change. He enjoys going on his [place] [employer] and it is his main stress reliever. He states he has never slept consistent hours and mentions it hasn't changed since surgery. He is ready to get back to exercising and increasing his stamina    Expected Outcomes Short: attend cardiac rehab for education and exercise Long: develop and maintain positive self care habits    Continue Psychosocial Services  Follow up required by staff          Psychosocial Re-Evaluation:   Psychosocial Discharge (Final Psychosocial Re-Evaluation):   Vocational Rehabilitation: Provide vocational rehab assistance to qualifying candidates.   Vocational Rehab Evaluation & Intervention:  Vocational Rehab - 07/27/24 1023       Initial Vocational Rehab Evaluation & Intervention   Assessment shows need for Vocational Rehabilitation No          Education: Education Goals: Education classes will be provided on a variety of topics geared toward better understanding of heart health and risk factor modification. Participant will state understanding/return demonstration of topics presented as noted by education test scores.  Learning Barriers/Preferences:  Learning Barriers/Preferences - 07/27/24 1023       Learning Barriers/Preferences   Learning Barriers None    Learning Preferences None          General Cardiac Education Topics:  AED/CPR: - Group verbal and written instruction with the use of models to demonstrate the basic use of the AED with the basic ABC's of resuscitation.   Test and Procedures: - Group verbal and visual presentation and models provide information about basic cardiac anatomy and function. Reviews the testing methods done to diagnose  heart disease and the outcomes of the test results. Describes the treatment choices: Medical Management, Angioplasty, or Coronary Bypass Surgery for treating various heart conditions including Myocardial Infarction, Angina, Valve Disease, and Cardiac Arrhythmias. Written material provided at class time.   Medication Safety: - Group verbal and visual instruction to review commonly prescribed medications for heart and lung disease. Reviews the medication, class of the drug, and side effects. Includes the steps to properly store meds and maintain the prescription regimen. Written material provided at class time.   Intimacy: - Group verbal instruction through game format to discuss how heart and lung disease can affect sexual intimacy. Written material provided at class time.   Know Your Numbers and Heart Failure: - Group verbal and visual instruction to discuss disease risk factors for cardiac and pulmonary disease and treatment options.  Reviews associated critical values for Overweight/Obesity, Hypertension, Cholesterol, and Diabetes.  Discusses basics of heart failure: signs/symptoms and treatments.  Introduces Heart Failure  Zone chart for action plan for heart failure. Written material provided at class time.   Infection Prevention: - Provides verbal and written material to individual with discussion of infection control including proper hand washing and proper equipment cleaning during exercise session. Flowsheet Row Cardiac Rehab from 07/27/2024 in [facility] Cardiac and Pulmonary Rehab  Date 07/27/24  Educator [facility]  Instruction Review Code 1- Verbalizes Understanding    Falls Prevention: - Provides verbal and written material to individual with discussion of falls prevention and safety. Flowsheet Row Cardiac Rehab from 07/27/2024 in [facility] Cardiac and Pulmonary Rehab  Date 07/27/24  Educator [facility]  Instruction Review Code 1- Verbalizes Understanding    Other: -Provides group and verbal  instruction on various topics (see comments)   Knowledge Questionnaire Score:   Core Components/Risk Factors/Patient Goals at Admission:  Personal Goals and Risk Factors at Admission - 07/27/24 1020       Core Components/Risk Factors/Patient Goals on Admission    Weight Management Yes;Weight Loss    Intervention Weight Management: Develop a combined nutrition and exercise program designed to reach desired caloric intake, while maintaining appropriate intake of nutrient and fiber, sodium and fats, and appropriate energy expenditure required for the weight goal.;Weight Management: Provide education and appropriate resources to help participant work on and attain dietary goals.    Goal Weight: Long Term 190 lb (86.2 kg)    Expected Outcomes Long Term: Adherence to nutrition and physical activity/exercise program aimed toward attainment of established weight goal;Short Term: Continue to assess and modify interventions until short term weight is achieved;Weight Loss: Understanding of general recommendations for a balanced deficit meal plan, which promotes 1-2 lb weight loss per week and includes a negative energy balance of [phone] kcal/d;Understanding recommendations for meals to include 15-35% energy as protein, 25-35% energy from fat, 35-60% energy from carbohydrates, less than 200mg  of dietary cholesterol, 20-35 gm of total fiber daily;Understanding of distribution of calorie intake throughout the day with the consumption of 4-5 meals/snacks    Diabetes --   pre   Hypertension Yes    Intervention Provide education on lifestyle modifcations including regular physical activity/exercise, weight management, moderate sodium restriction and increased consumption of fresh fruit, vegetables, and low fat dairy, alcohol moderation, and smoking cessation.;Monitor prescription use compliance.    Expected Outcomes Short Term: Continued assessment and intervention until BP is < 140/53mm HG in hypertensive  participants. < 130/5mm HG in hypertensive participants with diabetes, heart failure or chronic kidney disease.;Long Term: Maintenance of blood pressure at goal levels.    Lipids Yes    Intervention Provide education and support for participant on nutrition & aerobic/resistive exercise along with prescribed medications to achieve LDL 70mg , HDL >40mg .    Expected Outcomes Short Term: Participant states understanding of desired cholesterol values and is compliant with medications prescribed. Participant is following exercise prescription and nutrition guidelines.;Long Term: Cholesterol controlled with medications as prescribed, with individualized exercise RX and with personalized nutrition plan. Value goals: LDL < 70mg , HDL > 40 mg.          Education:Diabetes - Individual verbal and written instruction to review signs/symptoms of diabetes, desired ranges of glucose level fasting, after meals and with exercise. Acknowledge that pre and post exercise glucose checks will be done for 3 sessions at entry of program.   Core Components/Risk Factors/Patient Goals Review:    Core Components/Risk Factors/Patient Goals at Discharge (Final Review):    ITP Comments:  ITP Comments     Row Name 07/27/24 1054  ITP Comments Completed program orientation and . Initial ITP created and sent for review to Medical Director.          Comments: Initial ITP

## 2024-07-28 ENCOUNTER — Other Ambulatory Visit: Payer: Self-pay

## 2024-07-28 ENCOUNTER — Encounter: Payer: Self-pay | Admitting: Cardiovascular Disease

## 2024-07-28 MED ORDER — AMIODARONE HCL 200 MG PO TABS: ORAL_TABLET | ORAL | 0 refills | Status: DC

## 2024-07-28 MED ORDER — CLOPIDOGREL BISULFATE 75 MG PO TABS: 75.0000 mg | ORAL_TABLET | Freq: Every day | ORAL | 1 refills | Status: DC

## 2024-08-05 ENCOUNTER — Encounter

## 2024-08-05 DIAGNOSIS — Z951 Presence of aortocoronary bypass graft: Secondary | ICD-10-CM | POA: Insufficient documentation

## 2024-08-05 NOTE — Progress Notes (Signed)
 Daily Session Note  Patient Details  Name: Marc Myers MRN: [id number] Date of Birth: 11-Sep-1955 Referring Provider:   Flowsheet Row Cardiac Rehab from 07/27/2024 in [facility] Cardiac and Pulmonary Rehab  Referring Provider Dr. Evalene Lunger, MD    Encounter Date: 08/05/2024  Check In:  Session Check In - 08/05/24 0742       Check-In   Supervising physician immediately available to respond to emergencies See telemetry face sheet for immediately available ER MD    Location [facility]-Cardiac & Pulmonary Rehab    Staff Present Burnard Davenport [facility];Karna Serve PhD, RN,CNS,CEN;[facility], Exercise Physiologist;Joseph Hood RCP,RRT,BSRT    Virtual Visit No    Medication changes reported     No    Fall or balance concerns reported    No    Warm-up and Cool-down Performed on first and last piece of equipment    Resistance Training Performed Yes    VAD Patient? No    PAD/SET Patient? No      Pain Assessment   Currently in Pain? No/denies             Social History   Tobacco Use  Smoking Status Former   Types: Cigarettes  Smokeless Tobacco Never  Tobacco Comments   light     Goals Met:  Independence with exercise equipment Exercise tolerated well No report of concerns or symptoms today Strength training completed today  Goals Unmet:  Not Applicable  Comments: First full day of exercise!  Patient was oriented to gym and equipment including functions, settings, policies, and procedures.  Patient's individual exercise prescription and treatment plan were reviewed.  All starting workloads were established based on the results of the 6 minute walk test done at initial orientation visit.  The plan for exercise progression was also introduced and progression will be customized based on patient's performance and goals.    Dr. Oneil Pinal is Medical Director for [facility] Cardiac Rehabilitation.  Dr. Fuad Aleskerov is Medical Director for [facility] Pulmonary  Rehabilitation.

## 2024-08-07 ENCOUNTER — Encounter

## 2024-08-07 DIAGNOSIS — Z951 Presence of aortocoronary bypass graft: Secondary | ICD-10-CM | POA: Diagnosis not present

## 2024-08-07 NOTE — Progress Notes (Signed)
 Daily Session Note  Patient Details  Name: Marc Myers MRN: [id number] Date of Birth: 1956/03/28 Referring Provider:   Flowsheet Row Cardiac Rehab from 07/27/2024 in [facility] Cardiac and Pulmonary Rehab  Referring Provider Dr. Evalene Lunger, MD    Encounter Date: 08/07/2024  Check In:  Session Check In - 08/07/24 0725       Check-In   Supervising physician immediately available to respond to emergencies See telemetry face sheet for immediately available ER MD    Location [facility]-Cardiac & Pulmonary Rehab    Staff Present Burnard Davenport RN,BSN,MPA;Laura Cates RN,BSN;Joseph Rolinda RCP,RRT,BSRT    Virtual Visit No    Medication changes reported     No    Fall or balance concerns reported    No    Warm-up and Cool-down Performed on first and last piece of equipment    Resistance Training Performed Yes    VAD Patient? No    PAD/SET Patient? No      Pain Assessment   Currently in Pain? No/denies             Social History   Tobacco Use  Smoking Status Former   Types: Cigarettes  Smokeless Tobacco Never  Tobacco Comments   light     Goals Met:  Independence with exercise equipment Exercise tolerated well No report of concerns or symptoms today Strength training completed today  Goals Unmet:  Not Applicable  Comments: Pt able to follow exercise prescription today without complaint.  Will continue to monitor for progression.    Dr. Oneil Pinal is Medical Director for [facility] Cardiac Rehabilitation.  Dr. Fuad Aleskerov is Medical Director for [facility] Pulmonary Rehabilitation.

## 2024-08-10 ENCOUNTER — Encounter

## 2024-08-10 DIAGNOSIS — Z951 Presence of aortocoronary bypass graft: Secondary | ICD-10-CM

## 2024-08-10 NOTE — Progress Notes (Signed)
 Daily Session Note  Patient Details  Name: Marc Myers MRN: [id number] Date of Birth: 05-09-1956 Referring Provider:   Flowsheet Row Cardiac Rehab from 07/27/2024 in [facility] Cardiac and Pulmonary Rehab  Referring Provider Dr. Evalene Lunger, MD    Encounter Date: 08/10/2024  Check In:  Session Check In - 08/10/24 0758       Check-In   Supervising physician immediately available to respond to emergencies See telemetry face sheet for immediately available ER MD    Location [facility]-Cardiac & Pulmonary Rehab    Staff Present Burnard Davenport RN,BSN,MPA;Joseph [facility] Dyane BS, ACSM CEP, Exercise Physiologist;Jason Elnor RDN,LDN    Virtual Visit No    Medication changes reported     No    Fall or balance concerns reported    No    Warm-up and Cool-down Performed on first and last piece of equipment    Resistance Training Performed Yes    VAD Patient? No    PAD/SET Patient? No      Pain Assessment   Currently in Pain? No/denies             Social History   Tobacco Use  Smoking Status Former   Types: Cigarettes  Smokeless Tobacco Never  Tobacco Comments   light     Goals Met:  Independence with exercise equipment Exercise tolerated well No report of concerns or symptoms today Strength training completed today  Goals Unmet:  Not Applicable  Comments: Pt able to follow exercise prescription today without complaint.  Will continue to monitor for progression.    Dr. Oneil Pinal is Medical Director for [facility] Cardiac Rehabilitation.  Dr. Fuad Aleskerov is Medical Director for [facility] Pulmonary Rehabilitation.

## 2024-08-12 ENCOUNTER — Encounter

## 2024-08-12 DIAGNOSIS — Z951 Presence of aortocoronary bypass graft: Secondary | ICD-10-CM | POA: Diagnosis not present

## 2024-08-12 NOTE — Progress Notes (Signed)
 Daily Session Note  Patient Details  Name: Marc Myers MRN: [id number] Date of Birth: 10/29/1955 Referring Provider:   Flowsheet Row Cardiac Rehab from 07/27/2024 in [facility] Cardiac and Pulmonary Rehab  Referring Provider Dr. Evalene Lunger, MD    Encounter Date: 08/12/2024  Check In:  Session Check In - 08/12/24 0731       Check-In   Supervising physician immediately available to respond to emergencies See telemetry face sheet for immediately available ER MD    Location [facility]-Cardiac & Pulmonary Rehab    Staff Present Burnard Davenport RN,BSN,MPA;Joseph Rolinda RCP,RRT,BSRT;Margaret Best, MS, Exercise Physiologist;Noah Tickle, BS, Exercise Physiologist    Virtual Visit No    Medication changes reported     No    Fall or balance concerns reported    No    Warm-up and Cool-down Performed on first and last piece of equipment    Resistance Training Performed Yes    VAD Patient? No    PAD/SET Patient? No      Pain Assessment   Currently in Pain? No/denies             Social History   Tobacco Use  Smoking Status Former   Types: Cigarettes  Smokeless Tobacco Never  Tobacco Comments   light     Goals Met:  Independence with exercise equipment Exercise tolerated well No report of concerns or symptoms today Strength training completed today  Goals Unmet:  Not Applicable  Comments: Pt able to follow exercise prescription today without complaint.  Will continue to monitor for progression.    Dr. Oneil Pinal is Medical Director for [facility] Cardiac Rehabilitation.  Dr. Fuad Aleskerov is Medical Director for [facility] Pulmonary Rehabilitation.

## 2024-08-14 ENCOUNTER — Encounter: Admitting: Emergency Medicine

## 2024-08-14 DIAGNOSIS — Z951 Presence of aortocoronary bypass graft: Secondary | ICD-10-CM | POA: Diagnosis not present

## 2024-08-14 NOTE — Progress Notes (Signed)
 Daily Session Note  Patient Details  Name: Marc Myers MRN: [id number] Date of Birth: 09/01/1956 Referring Provider:   Flowsheet Row Cardiac Rehab from 07/27/2024 in [facility] Cardiac and Pulmonary Rehab  Referring Provider Dr. Evalene Lunger, MD    Encounter Date: 08/14/2024  Check In:  Session Check In - 08/14/24 0728       Check-In   Supervising physician immediately available to respond to emergencies See telemetry face sheet for immediately available ER MD    Location [facility]-Cardiac & Pulmonary Rehab    Staff Present Leita Franks RN,BSN;Joseph [facility] [place] [place], Exercise Physiologist    Virtual Visit No    Medication changes reported     No    Fall or balance concerns reported    No    Warm-up and Cool-down Performed on first and last piece of equipment    Resistance Training Performed Yes    VAD Patient? No    PAD/SET Patient? No      Pain Assessment   Currently in Pain? No/denies             Tobacco Use History[1]  Goals Met:  Independence with exercise equipment Exercise tolerated well No report of concerns or symptoms today Strength training completed today  Goals Unmet:  Not Applicable  Comments: Pt able to follow exercise prescription today without complaint.  Will continue to monitor for progression.    Dr. Oneil Pinal is Medical Director for [facility] Cardiac Rehabilitation.  Dr. Fuad Aleskerov is Medical Director for [facility] Pulmonary Rehabilitation.    [1]  Social History Tobacco Use  Smoking Status Former   Types: Cigarettes  Smokeless Tobacco Never  Tobacco Comments   light

## 2024-08-17 ENCOUNTER — Encounter

## 2024-08-17 DIAGNOSIS — Z951 Presence of aortocoronary bypass graft: Secondary | ICD-10-CM

## 2024-08-17 NOTE — Progress Notes (Signed)
 Daily Session Note  Patient Details  Name: Marc Myers MRN: [id number] Date of Birth: 07/02/1956 Referring Provider:   Flowsheet Row Cardiac Rehab from 07/27/2024 in [facility] Cardiac and Pulmonary Rehab  Referring Provider Dr. Evalene Lunger, MD    Encounter Date: 08/17/2024  Check In:  Session Check In - 08/17/24 0748       Check-In   Supervising physician immediately available to respond to emergencies See telemetry face sheet for immediately available ER MD    Location [facility]-Cardiac & Pulmonary Rehab    Staff Present Burnard Davenport RN,BSN,MPA;Joseph [facility] Dyane BS, ACSM CEP, Exercise Physiologist;Jason Elnor RDN,LDN    Virtual Visit No    Medication changes reported     No    Fall or balance concerns reported    No    Warm-up and Cool-down Performed on first and last piece of equipment    Resistance Training Performed Yes    VAD Patient? No    PAD/SET Patient? No      Pain Assessment   Currently in Pain? No/denies             Tobacco Use History[1]  Goals Met:  Independence with exercise equipment Exercise tolerated well No report of concerns or symptoms today Strength training completed today  Goals Unmet:  Not Applicable  Comments: Pt able to follow exercise prescription today without complaint.  Will continue to monitor for progression.    Dr. Oneil Pinal is Medical Director for [facility] Cardiac Rehabilitation.  Dr. Fuad Aleskerov is Medical Director for [facility] Pulmonary Rehabilitation.    [1]  Social History Tobacco Use  Smoking Status Former   Types: Cigarettes  Smokeless Tobacco Never  Tobacco Comments   light

## 2024-08-18 ENCOUNTER — Telehealth: Payer: Self-pay

## 2024-08-18 NOTE — Telephone Encounter (Signed)
 Copied from CRM #[id number]. Topic: Appointments - Scheduling Inquiry for Clinic >> Aug 18, 2024 12:31 PM Charolett L wrote: Reason for CRM: patient wife stated that for the [facility] the phone number that needs to be called is [phone]

## 2024-08-18 NOTE — Telephone Encounter (Signed)
 Noted

## 2024-08-19 ENCOUNTER — Encounter

## 2024-08-19 ENCOUNTER — Encounter: Payer: Self-pay | Admitting: *Deleted

## 2024-08-19 ENCOUNTER — Ambulatory Visit

## 2024-08-19 VITALS — BP 124/77 | Ht 69.0 in | Wt 214.0 lb

## 2024-08-19 DIAGNOSIS — Z951 Presence of aortocoronary bypass graft: Secondary | ICD-10-CM | POA: Diagnosis not present

## 2024-08-19 DIAGNOSIS — Z0000 Encounter for general adult medical examination without abnormal findings: Secondary | ICD-10-CM | POA: Diagnosis not present

## 2024-08-19 LAB — OPHTHALMOLOGY REPORT-SCANNED

## 2024-08-19 NOTE — Progress Notes (Signed)
 Daily Session Note  Patient Details  Name: Marc Myers MRN: [id number] Date of Birth: [birth year] Referring Provider:   Flowsheet Row Cardiac Rehab from 07/27/2024 in [facility] Cardiac and Pulmonary Rehab  Referring Provider Dr. Evalene Lunger, MD    Encounter Date: 08/19/2024  Check In:  Session Check In - 08/19/24 0724       Check-In   Supervising physician immediately available to respond to emergencies See telemetry face sheet for immediately available ER MD    Location [facility]-Cardiac & Pulmonary Rehab    Staff Present Burnard Davenport RN,BSN,MPA;Margaret Best, MS, Exercise Physiologist;Noah Tickle, BS, Exercise Physiologist    Virtual Visit No    Medication changes reported     No    Fall or balance concerns reported    No    Warm-up and Cool-down Performed on first and last piece of equipment    Resistance Training Performed Yes    VAD Patient? No    PAD/SET Patient? No      Pain Assessment   Currently in Pain? No/denies             Tobacco Use History[1]  Goals Met:  Independence with exercise equipment Exercise tolerated well No report of concerns or symptoms today Strength training completed today  Goals Unmet:  Not Applicable  Comments: Pt able to follow exercise prescription today without complaint.  Will continue to monitor for progression.    Dr. Oneil Pinal is Medical Director for [facility] Cardiac Rehabilitation.  Dr. Fuad Aleskerov is Medical Director for [facility] Pulmonary Rehabilitation.    [1]  Social History Tobacco Use  Smoking Status Former   Types: Cigarettes  Smokeless Tobacco Never  Tobacco Comments   light

## 2024-08-19 NOTE — Patient Instructions (Signed)
 Marc Myers,  Thank you for taking the time for your Medicare Wellness Visit. I appreciate your continued commitment to your health goals. Please review the care plan we discussed, and feel free to reach out if I can assist you further.  Please note that Annual Wellness Visits do not include a physical exam. Some assessments may be limited, especially if the visit was conducted virtually. If needed, we may recommend an in-person follow-up with your provider.  Ongoing Care Seeing your primary care provider every 3 to 6 months helps us  monitor your health and provide consistent, personalized care.  Consider updating your vaccines. Remember to see your eye doctor annually.   Referrals If a referral was made during today's visit and you haven't received any updates within two weeks, please contact the referred provider directly to check on the status.  Recommended Screenings:  Health Maintenance  Topic Date Due   Eye exam for diabetics  Never done   Pneumococcal Vaccine for age over 88 (1 of 2 - PCV) Never done   Zoster (Shingles) Vaccine (1 of 2) Never done   COVID-19 Vaccine (2 - Janssen risk series) 06/20/2020   Flu Shot  12/01/2024*   Hemoglobin A1C  11/19/2024   Yearly kidney health urinalysis for diabetes  04/15/2025   Complete foot exam   04/15/2025   Yearly kidney function blood test for diabetes  06/02/2025   Medicare Annual Wellness Visit  08/19/2025   Colon Cancer Screening  02/28/2031   DTaP/Tdap/Td vaccine (2 - Td or Tdap) 03/09/2031   Hepatitis C Screening  Completed   Meningitis B Vaccine  Aged Out  *Topic was postponed. The date shown is not the original due date.       08/19/2024    9:01 AM  Advanced Directives  Does Patient Have a Medical Advance Directive? No  Would patient like information on creating a medical advance directive? No - Patient declined    Vision: Annual vision screenings are recommended for early detection of glaucoma, cataracts, and diabetic  retinopathy. These exams can also reveal signs of chronic conditions such as diabetes and high blood pressure.  Dental: Annual dental screenings help detect early signs of oral cancer, gum disease, and other conditions linked to overall health, including heart disease and diabetes.  Please see the attached documents for additional preventive care recommendations.

## 2024-08-19 NOTE — Progress Notes (Signed)
 Chief Complaint  Patient presents with   Medicare Wellness     Subjective:   Marc Myers is a 68 y.o. male who presents for a Medicare Annual Wellness Visit.  Visit info / Clinical Intake: Medicare Wellness Visit Type:: Initial Annual Wellness Visit Persons participating in visit and providing information:: patient Medicare Wellness Visit Mode:: Telephone If telephone:: video declined Since this visit was completed virtually, some vitals may be partially provided or unavailable. Missing vitals are due to the limitations of the virtual format.: Documented vitals are patient reported If Telephone or Video please confirm:: I connected with patient using audio/video enable telemedicine. I verified patient identity with two identifiers, discussed telehealth limitations, and patient agreed to proceed. Patient Location:: Home Provider Location:: Office/Home Interpreter Needed?: No Pre-visit prep was completed: yes AWV questionnaire completed by patient prior to visit?: no Living arrangements:: lives with spouse/significant other Patient's Overall Health Status Rating: good Typical amount of pain: none Does pain affect daily life?: no Are you currently prescribed opioids?: no  Dietary Habits and Nutritional Risks How many meals a day?: 2 Eats fruit and vegetables daily?: yes Most meals are obtained by: preparing own meals In the last 2 weeks, have you had any of the following?: none Diabetic:: (!) yes Any non-healing wounds?: no How often do you check your BS?: 0 Would you like to be referred to a Nutritionist or for Diabetic Management? : no  Functional Status Activities of Daily Living (to include ambulation/medication): Independent Ambulation: Independent Medication Administration: Independent Home Management (perform basic housework or laundry): Independent Manage your own finances?: yes Primary transportation is: driving Concerns about vision?: no *vision screening is  required for WTM* Concerns about hearing?: no  Fall Screening Falls in the past year?: 0 Number of falls in past year: 0 Was there an injury with Fall?: 0 Fall Risk Category Calculator: 0 Patient Fall Risk Level: Low Fall Risk  Fall Risk Patient at Risk for Falls Due to: No Fall Risks Fall risk Follow up: Falls evaluation completed; Falls prevention discussed  Home and Transportation Safety: All rugs have non-skid backing?: yes All stairs or steps have railings?: yes Grab bars in the bathtub or shower?: (!) no Have non-skid surface in bathtub or shower?: (!) no Good home lighting?: yes Regular seat belt use?: (!) no Hospital stays in the last year:: (!) yes How many hospital stays:: 1 Reason: Heart surgery  Cognitive Assessment Difficulty concentrating, remembering, or making decisions? : no Will 6CIT or Mini Cog be Completed: yes What year is it?: 0 points What month is it?: 0 points Give patient an address phrase to remember (5 components): [address] [facility] [place] About what time is it?: 0 points Count backwards from 20 to 1: 0 points Say the months of the year in reverse: 4 points Repeat the address phrase from earlier: 2 points 6 CIT Score: 6 points  Advance Directives (For Healthcare) Does Patient Have a Medical Advance Directive?: No Type of Advance Directive: Living will Would patient like information on creating a medical advance directive?: No - Patient declined  Reviewed/Updated  Reviewed/Updated: Reviewed All (Medical, Surgical, Family, Medications, Allergies, Care Teams, Patient Goals)    Allergies (verified) Patient has no known allergies.   Current Medications (verified) Outpatient Encounter Medications as of 08/19/2024  Medication Sig   amiodarone  (PACERONE ) 200 MG tablet Taking 1 tablet by mouth once daily.   aspirin  EC 81 MG tablet Take 1 tablet (81 mg total) by mouth daily. Swallow whole.  clopidogrel  (PLAVIX ) 75 MG tablet Take 1 tablet  (75 mg total) by mouth daily.   ezetimibe  (ZETIA ) 10 MG tablet Take 1 tablet (10 mg total) by mouth daily.   losartan  (COZAAR ) 25 MG tablet Take 1 tablet (25 mg total) by mouth at bedtime.   metoprolol  tartrate (LOPRESSOR ) 25 MG tablet Take 0.5 tablets (12.5 mg total) by mouth 2 (two) times daily.   rosuvastatin  (CRESTOR ) 10 MG tablet Take 1 tablet (10 mg total) by mouth daily.   triamcinolone  cream (KENALOG ) 0.1 % Apply 1 Application topically 2 (two) times daily. To rash (Patient taking differently: Apply 1 Application topically 2 (two) times daily as needed (rash/itching).)   No facility-administered encounter medications on file as of 08/19/2024.    History: Past Medical History:  Diagnosis Date   Anxiety    Blood in stool    on and off since 2014    COVID-19    2021 and 11 or 08/2021   Dyspnea    with activity   Elevated blood pressure reading    Fatty liver    Heart murmur    Hyperlipidemia    Hypertension    Pre-diabetes    Past Surgical History:  Procedure Laterality Date   CARDIAC CATHETERIZATION     CORONARY ARTERY BYPASS GRAFT N/A 05/26/2024   Procedure: CORONARY ARTERY BYPASS GRAFTING (CABG) X 4, USING ENDOSCOPIC HARVESTED RIGHT GREATER SAPHENOUS VEIN;  Surgeon: Lucas Dorise POUR, MD;  Location: [facility] OR;  Service: Open Heart Surgery;  Laterality: N/A;   HEMORRHOID SURGERY  2024   INTRAOPERATIVE TRANSESOPHAGEAL ECHOCARDIOGRAM N/A 05/26/2024   Procedure: ECHOCARDIOGRAM, TRANSESOPHAGEAL, INTRAOPERATIVE;  Surgeon: Lucas Dorise POUR, MD;  Location: [facility] OR;  Service: Open Heart Surgery;  Laterality: N/A;   LEFT HEART CATH AND CORONARY ANGIOGRAPHY Left 05/19/2024   Procedure: LEFT HEART CATH AND CORONARY ANGIOGRAPHY;  Surgeon: Mady Bruckner, MD;  Location: [facility] INVASIVE CV LAB;  Service: Cardiovascular;  Laterality: Left;   left knee surgery Left    arthroscopy   UMBILICAL HERNIA REPAIR N/A 07/30/2018   Procedure: HERNIA REPAIR UMBILICAL ADULT;  Surgeon: Marolyn Nest, MD;   Location: [facility] ORS;  Service: General;  Laterality: N/A;   Family History  Problem Relation Age of Onset   Parkinson's disease Mother    Drug abuse Son        died overdose   Social History   Occupational History   Occupation: Artist Public Affairs Consultant  Tobacco Use   Smoking status: Former    Types: Cigarettes   Smokeless tobacco: Never   Tobacco comments:    light   Vaping Use   Vaping status: Never Used  Substance and Sexual Activity   Alcohol use: Yes    Comment: rarely   Drug use: Not Currently   Sexual activity: Yes   Tobacco Counseling Counseling given: Not Answered Tobacco comments: light   SDOH Screenings   Food Insecurity: No Food Insecurity (08/19/2024)  Housing: Low Risk (08/19/2024)  Transportation Needs: No Transportation Needs (08/19/2024)  Utilities: Not At Risk (08/19/2024)  Alcohol Screen: Low Risk (08/19/2024)  Depression (PHQ2-9): Low Risk (08/19/2024)  Financial Resource Strain: Low Risk (08/19/2024)  Physical Activity: Sufficiently Active (08/19/2024)  Social Connections: Moderately Integrated (08/19/2024)  Stress: No Stress Concern Present (08/19/2024)  Tobacco Use: Medium Risk (08/19/2024)  Health Literacy: Adequate Health Literacy (08/19/2024)   See flowsheets for full screening details  Depression Screen PHQ 2 & 9 Depression Scale- Over the past 2 weeks, how often have you been bothered by  any of the following problems? Little interest or pleasure in doing things: 0 Feeling down, depressed, or hopeless (PHQ Adolescent also includes...irritable): 0 PHQ-2 Total Score: 0 Trouble falling or staying asleep, or sleeping too much: 0 Feeling tired or having little energy: 0 Poor appetite or overeating (PHQ Adolescent also includes...weight loss): 0 Feeling bad about yourself - or that you are a failure or have let yourself or your family down: 0 Trouble concentrating on things, such as reading the newspaper or watching television (PHQ Adolescent also  includes...like school work): 0 Moving or speaking so slowly that other people could have noticed. Or the opposite - being so fidgety or restless that you have been moving around a lot more than usual: 0 Thoughts that you would be better off dead, or of hurting yourself in some way: 0 PHQ-9 Total Score: 0 If you checked off any problems, how difficult have these problems made it for you to do your work, take care of things at home, or get along with other people?: Not difficult at all     Goals Addressed             This Visit's Progress    Patient Stated       Wants to lose some weight and work out             Objective:    Today's Vitals   08/19/24 0853  BP: 124/77  Weight: 214 lb (97.1 kg)  Height: 5' 9 (1.753 m)   Body mass index is 31.6 kg/m.  Hearing/Vision screen Hearing Screening - Comments:: No issues Vision Screening - Comments:: Readers, [facility], up to date per patient, will request records Immunizations and Health Maintenance Health Maintenance  Topic Date Due   OPHTHALMOLOGY EXAM  Never done   Pneumococcal Vaccine: 50+ Years (1 of 2 - PCV) Never done   Zoster Vaccines- Shingrix (1 of 2) Never done   COVID-19 Vaccine (2 - Janssen risk series) 06/20/2020   Influenza Vaccine  12/01/2024 (Originally 04/03/2024)   HEMOGLOBIN A1C  11/19/2024   Diabetic kidney evaluation - Urine ACR  04/15/2025   FOOT EXAM  04/15/2025   Diabetic kidney evaluation - eGFR measurement  06/02/2025   Medicare Annual Wellness (AWV)  08/19/2025   Colonoscopy  02/28/2031   DTaP/Tdap/Td (2 - Td or Tdap) 03/09/2031   Hepatitis C Screening  Completed   Meningococcal B Vaccine  Aged Out        Assessment/Plan:  This is a routine wellness examination for [place].  Patient Care Team: Marylynn Verneita CROME, MD as PCP - General (Internal Medicine) Perla Evalene PARAS, MD as PCP - Cardiology (Cardiology) [facility], [facility])  I have personally reviewed and noted the  following in the patients chart:   Medical and social history Use of alcohol, tobacco or illicit drugs  Current medications and supplements including opioid prescriptions. Functional ability and status Nutritional status Physical activity Advanced directives List of other physicians Hospitalizations, surgeries, and ER visits in previous 12 months Vitals Screenings to include cognitive, depression, and falls Referrals and appointments  No orders of the defined types were placed in this encounter.  In addition, I have reviewed and discussed with patient certain preventive protocols, quality metrics, and best practice recommendations. A written personalized care plan for preventive services as well as general preventive health recommendations were provided to patient.   Angeline Fredericks, LPN   [id number]   Return in 1 year (on 08/19/2025).  After Visit  Summary: (MyChart) Due to this being a telephonic visit, the after visit summary with patients personalized plan was offered to patient via MyChart   Nurse Notes: Patient declines vaccines. Request sent for last eye exam office notes.

## 2024-08-19 NOTE — Progress Notes (Signed)
 Cardiac Individual Treatment Plan  Patient Details  Name: Marc Myers MRN: [id number] Date of Birth: August 25, 1956 Referring Provider:   Flowsheet Row Cardiac Rehab from 07/27/2024 in [facility] Cardiac and Pulmonary Rehab  Referring Provider Dr. Evalene Lunger, MD    Initial Encounter Date:  Flowsheet Row Cardiac Rehab from 07/27/2024 in [facility] Cardiac and Pulmonary Rehab  Date 07/27/24    Visit Diagnosis: S/P CABG x 4  Patient's Home Medications on Admission: Current Medications[1]  Past Medical History: Past Medical History:  Diagnosis Date   Anxiety    Blood in stool    on and off since 2014    COVID-19    2021 and 11 or 08/2021   Dyspnea    with activity   Elevated blood pressure reading    Fatty liver    Heart murmur    Hyperlipidemia    Hypertension    Pre-diabetes     Tobacco Use: Tobacco Use History[2]  Labs: Review Flowsheet  More data exists      Latest Ref Rng & Units 03/13/2023 01/09/2024 04/15/2024 05/22/2024 05/26/2024  Labs for ITP Cardiac and Pulmonary Rehab  Cholestrol 0 - 200 mg/dL 771  799  798  - -  LDL (calc) 0 - 99 mg/dL 843  872  880  - -  Direct LDL mg/dL - 865.9  863.9  - -  HDL-C >39.00 mg/dL 53.89  50.69  50.09  - -  Trlycerides 0.0 - 149.0 mg/dL 869.9  880.9  837.9  - -  Hemoglobin A1c 4.8 - 5.6 % - 6.5  6.4  5.7  -  PH, Arterial 7.35 - 7.45 - - - - 7.293  7.272  7.358  7.421  7.301  7.343   PCO2 arterial 32 - 48 mmHg - - - - 47.9  49.2  41.6  35.1  37.7  43.1   Bicarbonate 20.0 - 28.0 mmol/L - - - - 23.2  22.8  23.6  22.8  18.6  23.4   TCO2 22 - 32 mmol/L - - - - 25  24  25  23  24  25  27  29  20  25  26  25    Acid-base deficit 0.0 - 2.0 mmol/L - - - - 4.0  4.0  2.0  1.0  7.0  2.0   O2 Saturation % - - - - 92  94  96  99  100  100     Details       Multiple values from one day are sorted in reverse-chronological order          Exercise Target Goals: Exercise Program Goal: Individual exercise prescription set using results from  initial 6 min walk test and THRR while considering  patients activity barriers and safety.   Exercise Prescription Goal: Initial exercise prescription builds to 30-45 minutes a day of aerobic activity, 2-3 days per week.  Home exercise guidelines will be given to patient during program as part of exercise prescription that the participant will acknowledge.   Education: Aerobic Exercise: - Group verbal and visual presentation on the components of exercise prescription. Introduces F.I.T.T principle from ACSM for exercise prescriptions.  Reviews F.I.T.T. principles of aerobic exercise including progression. Written material provided at class time.   Education: Resistance Exercise: - Group verbal and visual presentation on the components of exercise prescription. Introduces F.I.T.T principle from ACSM for exercise prescriptions  Reviews F.I.T.T. principles of resistance exercise including progression. Written material provided at class time.  Education: Exercise & Equipment Safety: - Individual verbal instruction and demonstration of equipment use and safety with use of the equipment. Flowsheet Row Cardiac Rehab from 07/27/2024 in [facility] Cardiac and Pulmonary Rehab  Date 07/27/24  Educator [facility]  Instruction Review Code 1- Verbalizes Understanding    Education: Exercise Physiology & General Exercise Guidelines: - Group verbal and written instruction with models to review the exercise physiology of the cardiovascular system and associated critical values. Provides general exercise guidelines with specific guidelines to those with heart or lung disease. Written material provided at class time.   Education: Flexibility, Balance, Mind/Body Relaxation: - Group verbal and visual presentation with interactive activity on the components of exercise prescription. Introduces F.I.T.T principle from ACSM for exercise prescriptions. Reviews F.I.T.T. principles of flexibility and balance exercise training  including progression. Also discusses the mind body connection.  Reviews various relaxation techniques to help reduce and manage stress (i.e. Deep breathing, progressive muscle relaxation, and visualization). Balance handout provided to take home. Written material provided at class time.   Activity Barriers & Risk Stratification:  Activity Barriers & Cardiac Risk Stratification - 07/27/24 1149       Activity Barriers & Cardiac Risk Stratification   Activity Barriers Joint Problems    Cardiac Risk Stratification High          6 Minute Walk:  6 Minute Walk     Row Name 07/27/24 1035         6 Minute Walk   Phase Initial     Distance 1355 feet     Walk Time 6 minutes     # of Rest Breaks 0     MPH 2.57     METS 2.89     RPE 9     Perceived Dyspnea  0     VO2 Peak 10.13     Symptoms No     Resting HR 57 bpm     Resting BP 148/82     Resting Oxygen Saturation  96 %     Exercise Oxygen Saturation  during 6 min walk 95 %     Max Ex. HR 75 bpm     Max Ex. BP 154/84     2 Minute Post BP 152/84        Oxygen Initial Assessment:   Oxygen Re-Evaluation:   Oxygen Discharge (Final Oxygen Re-Evaluation):   Initial Exercise Prescription:  Initial Exercise Prescription - 07/27/24 1100       Date of Initial Exercise RX and Referring Provider   Date 07/27/24    Referring Provider Dr. Timothy Gollan, MD      Oxygen   Maintain Oxygen Saturation 88% or higher      Treadmill   MPH 2.5    Grade 0    Minutes 15    METs 2.91      REL-XR   Level 3    Speed 50    Minutes 15    METs 2.89      T5 Nustep   Level 2    SPM 80    Minutes 15    METs 2.89      Prescription Details   Frequency (times per week) 3    Duration Progress to 30 minutes of continuous aerobic without signs/symptoms of physical distress      Intensity   THRR 40-80% of Max Heartrate 95-134    Ratings of Perceived Exertion 11-13    Perceived Dyspnea 0-4      Progression  Progression  Continue to progress workloads to maintain intensity without signs/symptoms of physical distress.      Resistance Training   Training Prescription Yes    Weight 5 lb    Reps 10-15          Perform Capillary Blood Glucose checks as needed.  Exercise Prescription Changes:   Exercise Prescription Changes     Row Name 07/27/24 1100 08/19/24 1100           Response to Exercise   Blood Pressure (Admit) 148/82 112/60      Blood Pressure (Exercise) 154/84 148/70      Blood Pressure (Exit) 152/84 110/70      Heart Rate (Admit) 57 bpm 55 bpm      Heart Rate (Exercise) 75 bpm 148 bpm      Heart Rate (Exit) 60 bpm 65 bpm      Oxygen Saturation (Admit) 96 % --      Oxygen Saturation (Exercise) 95 % --      Rating of Perceived Exertion (Exercise) 9 13      Perceived Dyspnea (Exercise) 0 --      Symptoms none none      Comments Results 1st 2 weeks of exercise sessions      Duration -- Progress to 30 minutes of  aerobic without signs/symptoms of physical distress      Intensity -- THRR unchanged        Progression   Progression -- Continue to progress workloads to maintain intensity without signs/symptoms of physical distress.      Average METs -- 3.77        Resistance Training   Weight -- 5 lb      Reps -- 10-15        Interval Training   Interval Training -- No        Treadmill   MPH -- 3.4      Grade -- 2      Minutes -- 15      METs -- 4.54        REL-XR   Level -- 4      Minutes -- 15        Biostep-RELP   Level -- 4      SPM -- 50      Minutes -- 15      METs -- 4        Oxygen   Maintain Oxygen Saturation -- 88% or higher         Exercise Comments:   Exercise Comments     Row Name 08/05/24 0743           Exercise Comments First full day of exercise!  Patient was oriented to gym and equipment including functions, settings, policies, and procedures.  Patient's individual exercise prescription and treatment plan were reviewed.  All starting  workloads were established based on the results of the 6 minute walk test done at initial orientation visit.  The plan for exercise progression was also introduced and progression will be customized based on patient's performance and goals.          Exercise Goals and Review:   Exercise Goals     Row Name 07/27/24 1149             Exercise Goals   Increase Physical Activity Yes       Intervention Provide advice, education, support and counseling about physical activity/exercise needs.;Develop an individualized exercise prescription for aerobic and resistive  training based on initial evaluation findings, risk stratification, comorbidities and participant's personal goals.       Expected Outcomes Short Term: Attend rehab on a regular basis to increase amount of physical activity.;Long Term: Add in home exercise to make exercise part of routine and to increase amount of physical activity.;Long Term: Exercising regularly at least 3-5 days a week.       Increase Strength and Stamina Yes       Intervention Develop an individualized exercise prescription for aerobic and resistive training based on initial evaluation findings, risk stratification, comorbidities and participant's personal goals.;Provide advice, education, support and counseling about physical activity/exercise needs.       Expected Outcomes Short Term: Increase workloads from initial exercise prescription for resistance, speed, and METs.;Short Term: Perform resistance training exercises routinely during rehab and add in resistance training at home;Long Term: Improve cardiorespiratory fitness, muscular endurance and strength as measured by increased METs and functional capacity ( )       Able to understand and use rate of perceived exertion (RPE) scale Yes       Intervention Provide education and explanation on how to use RPE scale       Expected Outcomes Short Term: Able to use RPE daily in rehab to express subjective intensity  level;Long Term:  Able to use RPE to guide intensity level when exercising independently       Able to understand and use Dyspnea scale Yes       Intervention Provide education and explanation on how to use Dyspnea scale       Expected Outcomes Short Term: Able to use Dyspnea scale daily in rehab to express subjective sense of shortness of breath during exertion;Long Term: Able to use Dyspnea scale to guide intensity level when exercising independently       Knowledge and understanding of Target Heart Rate Range (THRR) Yes       Intervention Provide education and explanation of THRR including how the numbers were predicted and where they are located for reference       Expected Outcomes Long Term: Able to use THRR to govern intensity when exercising independently;Short Term: Able to state/look up THRR;Short Term: Able to use daily as guideline for intensity in rehab       Able to check pulse independently Yes       Intervention Review the importance of being able to check your own pulse for safety during independent exercise;Provide education and demonstration on how to check pulse in carotid and radial arteries.       Expected Outcomes Long Term: Able to check pulse independently and accurately;Short Term: Able to explain why pulse checking is important during independent exercise       Understanding of Exercise Prescription Yes       Intervention Provide education, explanation, and written materials on patient's individual exercise prescription       Expected Outcomes Short Term: Able to explain program exercise prescription;Long Term: Able to explain home exercise prescription to exercise independently          Exercise Goals Re-Evaluation :  Exercise Goals Re-Evaluation     Row Name 08/05/24 0743 08/19/24 1154           Exercise Goal Re-Evaluation   Exercise Goals Review Increase Physical Activity;Able to understand and use rate of perceived exertion (RPE) scale;Knowledge and  understanding of Target Heart Rate Range (THRR);Understanding of Exercise Prescription;Increase Strength and Stamina;Able to understand and use Dyspnea scale;Able to check pulse  independently Increase Physical Activity;Understanding of Exercise Prescription;Increase Strength and Stamina      Comments Reviewed RPE and dyspnea scale, THR and program prescription with pt today.  Pt voiced understanding and was given a copy of goals to take home. Marc Myers is off to a good start in the program and completed his first 2 weeks of exercise sessions in this review. He walked at a speed of 3.4 mph and 2% incline. He worked at level 4 on the XR and biostep. We will continue to monitor his progress in the program.      Expected Outcomes Short: Use RPE daily to regulate intensity. Long: Follow program prescription in THR. Short: Continue to follow current exercise prescription. Long: Continue exercise to improve strength and stamina.         Discharge Exercise Prescription (Final Exercise Prescription Changes):  Exercise Prescription Changes - 08/19/24 1100       Response to Exercise   Blood Pressure (Admit) 112/60    Blood Pressure (Exercise) 148/70    Blood Pressure (Exit) 110/70    Heart Rate (Admit) 55 bpm    Heart Rate (Exercise) 148 bpm    Heart Rate (Exit) 65 bpm    Rating of Perceived Exertion (Exercise) 13    Symptoms none    Comments 1st 2 weeks of exercise sessions    Duration Progress to 30 minutes of  aerobic without signs/symptoms of physical distress    Intensity THRR unchanged      Progression   Progression Continue to progress workloads to maintain intensity without signs/symptoms of physical distress.    Average METs 3.77      Resistance Training   Weight 5 lb    Reps 10-15      Interval Training   Interval Training No      Treadmill   MPH 3.4    Grade 2    Minutes 15    METs 4.54      REL-XR   Level 4    Minutes 15      Biostep-RELP   Level 4    SPM 50    Minutes 15     METs 4      Oxygen   Maintain Oxygen Saturation 88% or higher          Nutrition:  Target Goals: Understanding of nutrition guidelines, daily intake of sodium 1500mg , cholesterol 200mg , calories 30% from fat and 7% or less from saturated fats, daily to have 5 or more servings of fruits and vegetables.  Education: Nutrition 1 -Group instruction provided by verbal, written material, interactive activities, discussions, models, and posters to present general guidelines for heart healthy nutrition including macronutrients, label reading, and promoting whole foods over processed counterparts. Education serves as pensions consultant of discussion of heart healthy eating for all. Written material provided at class time.    Education: Nutrition 2 -Group instruction provided by verbal, written material, interactive activities, discussions, models, and posters to present general guidelines for heart healthy nutrition including sodium, cholesterol, and saturated fat. Providing guidance of habit forming to improve blood pressure, cholesterol, and body weight. Written material provided at class time.     Biometrics:  Pre Biometrics - 07/27/24 1150       Pre Biometrics   Height 5' 9.25 (1.759 m)    Weight 213 lb 14.4 oz (97 kg)    Waist Circumference 42 inches    Hip Circumference 41 inches    Waist to Hip Ratio 1.02 %  BMI (Calculated) 31.36    Single Leg Stand 30 seconds           Nutrition Therapy Plan and Nutrition Goals:  Nutrition Therapy & Goals - 07/27/24 1108       Nutrition Therapy   Diet Cardiac, Low Na    Protein (specify units) 90    Fiber 30 grams    Whole Grain Foods 3 servings    Saturated Fats 15 max. grams    Fruits and Vegetables 5 servings/day    Sodium 2 grams      Personal Nutrition Goals   Nutrition Goal Read labels and reduce sodium intake to below 2300mg . Ideally 1500mg  per day.    Personal Goal #2 Reduce saturated fat, less than 12g per day. Replace  bad fats for more heart healthy fats.    Personal Goal #3 Eat 15-30gProtein and 30-60gCarbs at each meal.    Comments Patient drinking water  and likes milk. Drinks a gallon in ~5 days. Says he thinks its 2%. Spoke with him about fat content in milk and encouraged him try a lower percent milk. He says he has been eating poorly as of late because of all the stress and busy appointments. But he is looking to get back to normal where he was more on top of eating better. Reviewed Mediterranean diet handout. Educated on type of fats, sources and how to read labels. Brainstormed a few meal ideas to help eat more balanced meals with less sodium and saturated fat      Intervention Plan   Intervention Prescribe, educate and counsel regarding individualized specific dietary modifications aiming towards targeted core components such as weight, hypertension, lipid management, diabetes, heart failure and other comorbidities.;Nutrition handout(s) given to patient.    Expected Outcomes Short Term Goal: Understand basic principles of dietary content, such as calories, fat, sodium, cholesterol and nutrients.;Short Term Goal: A plan has been developed with personal nutrition goals set during dietitian appointment.;Long Term Goal: Adherence to prescribed nutrition plan.          Nutrition Assessments:  MEDIFICTS Score Key: >=70 Need to make dietary changes  40-70 Heart Healthy Diet <= 40 Therapeutic Level Cholesterol Diet   Picture Your Plate Scores: <59 Unhealthy dietary pattern with much room for improvement. 41-50 Dietary pattern unlikely to meet recommendations for good health and room for improvement. 51-60 More healthful dietary pattern, with some room for improvement.  >60 Healthy dietary pattern, although there may be some specific behaviors that could be improved.    Nutrition Goals Re-Evaluation:   Nutrition Goals Discharge (Final Nutrition Goals Re-Evaluation):   Psychosocial: Target Goals:  Acknowledge presence or absence of significant depression and/or stress, maximize coping skills, provide positive support system. Participant is able to verbalize types and ability to use techniques and skills needed for reducing stress and depression.   Education: Stress, Anxiety, and Depression - Group verbal and visual presentation to define topics covered.  Reviews how body is impacted by stress, anxiety, and depression.  Also discusses healthy ways to reduce stress and to treat/manage anxiety and depression. Written material provided at class time.   Education: Sleep Hygiene -Provides group verbal and written instruction about how sleep can affect your health.  Define sleep hygiene, discuss sleep cycles and impact of sleep habits. Review good sleep hygiene tips.   Initial Review & Psychosocial Screening:  Initial Psych Review & Screening - 07/27/24 1055       Initial Review   Current issues with Current Stress Concerns  Family Dynamics   Good Support System? Yes      Barriers   Psychosocial barriers to participate in program There are no identifiable barriers or psychosocial needs.      Screening Interventions   Interventions Encouraged to exercise;Provide feedback about the scores to participant;To provide support and resources with identified psychosocial needs    Expected Outcomes Short Term goal: Utilizing psychosocial counselor, staff and physician to assist with identification of specific Stressors or current issues interfering with healing process. Setting desired goal for each stressor or current issue identified.;Long Term Goal: Stressors or current issues are controlled or eliminated.;Short Term goal: Identification and review with participant of any Quality of Life or Depression concerns found by scoring the questionnaire.;Long Term goal: The participant improves quality of Life and PHQ9 Scores as seen by post scores and/or verbalization of changes          Quality  of Life Scores:   Scores of 19 and below usually indicate a poorer quality of life in these areas.  A difference of  2-3 points is a clinically meaningful difference.  A difference of 2-3 points in the total score of the Quality of Life Index has been associated with significant improvement in overall quality of life, self-image, physical symptoms, and general health in studies assessing change in quality of life.  PHQ-9: Review Flowsheet  More data exists      08/19/2024 07/27/2024 06/08/2024 06/03/2024 04/15/2024  Depression screen PHQ 2/9  Decreased Interest 0 0 0 0 0  Down, Depressed, Hopeless 0 0 0 0 0  PHQ - 2 Score 0 0 0 0 0  Altered sleeping 0 2 0 - -  Tired, decreased energy 0 0 0 - -  Change in appetite 0 0 0 - -  Feeling bad or failure about yourself  0 0 0 - -  Trouble concentrating 0 0 0 - -  Moving slowly or fidgety/restless 0 0 0 - -  Suicidal thoughts 0 0 0 - -  PHQ-9 Score 0 2 0  - -  Difficult doing work/chores Not difficult at all Not difficult at all Not difficult at all - -    Details       Data saved with a previous flowsheet row definition        Interpretation of Total Score  Total Score Depression Severity:  1-4 = Minimal depression, 5-9 = Mild depression, 10-14 = Moderate depression, 15-19 = Moderately severe depression, 20-27 = Severe depression   Psychosocial Evaluation and Intervention:  Psychosocial Evaluation - 07/27/24 1056       Psychosocial Evaluation & Interventions   Interventions Encouraged to exercise with the program and follow exercise prescription    Comments Marc Myers is coming to cardiac rehab post CABG. He states this came as a surprise to him since he doesn't have a history of heart issues. He has been working with doctors for a year and half trying to figure out why he was having some symptoms and it was revealed after the most recent test that he needed surgery and it was his heart. 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 He notes it has been some stress navigating this big life change. He enjoys going on his [place] at [employer] and it is his main stress reliever. He states he has never slept consistent hours and mentions it hasn't changed since surgery. He is ready to get back to exercising and  increasing his stamina    Expected Outcomes Short: attend cardiac rehab for education and exercise Long: develop and maintain positive self care habits    Continue Psychosocial Services  Follow up required by staff          Psychosocial Re-Evaluation:   Psychosocial Discharge (Final Psychosocial Re-Evaluation):   Vocational Rehabilitation: Provide vocational rehab assistance to qualifying candidates.   Vocational Rehab Evaluation & Intervention:  Vocational Rehab - 07/27/24 1023       Initial Vocational Rehab Evaluation & Intervention   Assessment shows need for Vocational Rehabilitation No          Education: Education Goals: Education classes will be provided on a variety of topics geared toward better understanding of heart health and risk factor modification. Participant will state understanding/return demonstration of topics presented as noted by education test scores.  Learning Barriers/Preferences:  Learning Barriers/Preferences - 07/27/24 1023       Learning Barriers/Preferences   Learning Barriers None    Learning Preferences None          General Cardiac Education Topics:  AED/CPR: - Group verbal and written instruction with the use of models to demonstrate the basic use of the AED with the basic ABC's of resuscitation.   Test and Procedures: - Group verbal and visual presentation and models provide information about basic cardiac anatomy and function. Reviews the testing methods done to diagnose heart disease and the outcomes of the test results. Describes the treatment choices: Medical Management, Angioplasty, or Coronary Bypass Surgery for treating  various heart conditions including Myocardial Infarction, Angina, Valve Disease, and Cardiac Arrhythmias. Written material provided at class time.   Medication Safety: - Group verbal and visual instruction to review commonly prescribed medications for heart and lung disease. Reviews the medication, class of the drug, and side effects. Includes the steps to properly store meds and maintain the prescription regimen. Written material provided at class time.   Intimacy: - Group verbal instruction through game format to discuss how heart and lung disease can affect sexual intimacy. Written material provided at class time.   Know Your Numbers and Heart Failure: - Group verbal and visual instruction to discuss disease risk factors for cardiac and pulmonary disease and treatment options.  Reviews associated critical values for Overweight/Obesity, Hypertension, Cholesterol, and Diabetes.  Discusses basics of heart failure: signs/symptoms and treatments.  Introduces Heart Failure Zone chart for action plan for heart failure. Written material provided at class time.   Infection Prevention: - Provides verbal and written material to individual with discussion of infection control including proper hand washing and proper equipment cleaning during exercise session. Flowsheet Row Cardiac Rehab from 07/27/2024 in [facility] Cardiac and Pulmonary Rehab  Date 07/27/24  Educator [facility]  Instruction Review Code 1- Verbalizes Understanding    Falls Prevention: - Provides verbal and written material to individual with discussion of falls prevention and safety. Flowsheet Row Cardiac Rehab from 07/27/2024 in [facility] Cardiac and Pulmonary Rehab  Date 07/27/24  Educator [facility]  Instruction Review Code 1- Verbalizes Understanding    Other: -Provides group and verbal instruction on various topics (see comments)   Knowledge Questionnaire Score:   Core Components/Risk Factors/Patient Goals at Admission:  Personal Goals and  Risk Factors at Admission - 07/27/24 1020       Core Components/Risk Factors/Patient Goals on Admission    Weight Management Yes;Weight Loss    Intervention Weight Management: Develop a combined nutrition and exercise program designed to reach desired caloric intake, while maintaining  appropriate intake of nutrient and fiber, sodium and fats, and appropriate energy expenditure required for the weight goal.;Weight Management: Provide education and appropriate resources to help participant work on and attain dietary goals.    Goal Weight: Long Term 190 lb (86.2 kg)    Expected Outcomes Long Term: Adherence to nutrition and physical activity/exercise program aimed toward attainment of established weight goal;Short Term: Continue to assess and modify interventions until short term weight is achieved;Weight Loss: Understanding of general recommendations for a balanced deficit meal plan, which promotes 1-2 lb weight loss per week and includes a negative energy balance of [id number] kcal/d;Understanding recommendations for meals to include 15-35% energy as protein, 25-35% energy from fat, 35-60% energy from carbohydrates, less than 200mg  of dietary cholesterol, 20-35 gm of total fiber daily;Understanding of distribution of calorie intake throughout the day with the consumption of 4-5 meals/snacks    Diabetes --   pre   Hypertension Yes    Intervention Provide education on lifestyle modifcations including regular physical activity/exercise, weight management, moderate sodium restriction and increased consumption of fresh fruit, vegetables, and low fat dairy, alcohol moderation, and smoking cessation.;Monitor prescription use compliance.    Expected Outcomes Short Term: Continued assessment and intervention until BP is < 140/67mm HG in hypertensive participants. < 130/29mm HG in hypertensive participants with diabetes, heart failure or chronic kidney disease.;Long Term: Maintenance of blood pressure at goal levels.     Lipids Yes    Intervention Provide education and support for participant on nutrition & aerobic/resistive exercise along with prescribed medications to achieve LDL 70mg , HDL >40mg .    Expected Outcomes Short Term: Participant states understanding of desired cholesterol values and is compliant with medications prescribed. Participant is following exercise prescription and nutrition guidelines.;Long Term: Cholesterol controlled with medications as prescribed, with individualized exercise RX and with personalized nutrition plan. Value goals: LDL < 70mg , HDL > 40 mg.          Education:Diabetes - Individual verbal and written instruction to review signs/symptoms of diabetes, desired ranges of glucose level fasting, after meals and with exercise. Acknowledge that pre and post exercise glucose checks will be done for 3 sessions at entry of program.   Core Components/Risk Factors/Patient Goals Review:    Core Components/Risk Factors/Patient Goals at Discharge (Final Review):    ITP Comments:  ITP Comments     Row Name 07/27/24 1054 08/05/24 0743 08/19/24 1338       ITP Comments Completed program orientation and . Initial ITP created and sent for review to Medical Director. First full day of exercise!  Patient was oriented to gym and equipment including functions, settings, policies, and procedures.  Patient's individual exercise prescription and treatment plan were reviewed.  All starting workloads were established based on the results of the 6 minute walk test done at initial orientation visit.  The plan for exercise progression was also introduced and progression will be customized based on patient's performance and goals. 30 Day review completed. Medical Director ITP review done, changes made as directed, and signed approval by Medical Director. New to program        Comments: 30 Day Review     [1]  Current Outpatient Medications:    amiodarone  (PACERONE ) 200 MG tablet, Taking 1  tablet by mouth once daily., Disp: 90 tablet, Rfl: 0   aspirin  EC 81 MG tablet, Take 1 tablet (81 mg total) by mouth daily. Swallow whole., Disp: , Rfl:    clopidogrel  (PLAVIX ) 75 MG tablet, Take 1 tablet (  75 mg total) by mouth daily., Disp: 30 tablet, Rfl: 1   ezetimibe  (ZETIA ) 10 MG tablet, Take 1 tablet (10 mg total) by mouth daily., Disp: 90 tablet, Rfl: 1   losartan  (COZAAR ) 25 MG tablet, Take 1 tablet (25 mg total) by mouth at bedtime., Disp: 100 tablet, Rfl: 0   metoprolol  tartrate (LOPRESSOR ) 25 MG tablet, Take 0.5 tablets (12.5 mg total) by mouth 2 (two) times daily., Disp: 90 tablet, Rfl: 3   rosuvastatin  (CRESTOR ) 10 MG tablet, Take 1 tablet (10 mg total) by mouth daily., Disp: 90 tablet, Rfl: 3   triamcinolone  cream (KENALOG ) 0.1 %, Apply 1 Application topically 2 (two) times daily. To rash (Patient taking differently: Apply 1 Application topically 2 (two) times daily as needed (rash/itching).), Disp: 30 g, Rfl: 0 [2]  Social History Tobacco Use  Smoking Status Former   Types: Cigarettes  Smokeless Tobacco Never  Tobacco Comments   light

## 2024-08-21 ENCOUNTER — Encounter

## 2024-08-21 DIAGNOSIS — Z951 Presence of aortocoronary bypass graft: Secondary | ICD-10-CM | POA: Diagnosis not present

## 2024-08-21 NOTE — Progress Notes (Signed)
 Daily Session Note  Patient Details  Name: Akio Hudnall MRN: [id number] Date of Birth: 1956/06/03 Referring Provider:   Flowsheet Row Cardiac Rehab from 07/27/2024 in [facility] Cardiac and Pulmonary Rehab  Referring Provider Dr. Evalene Lunger, MD    Encounter Date: 08/21/2024  Check In:  Session Check In - 08/21/24 0740       Check-In   Supervising physician immediately available to respond to emergencies See telemetry face sheet for immediately available ER MD    Location [facility]-Cardiac & Pulmonary Rehab    Staff Present Maxon Conetta BS, Exercise Physiologist;Joseph Rolinda NORWOOD HARMAN Tsosie Peggi, RN, DNP, NE-BC    Virtual Visit No    Medication changes reported     No    Fall or balance concerns reported    No    Warm-up and Cool-down Performed on first and last piece of equipment    Resistance Training Performed Yes    VAD Patient? No    PAD/SET Patient? No      Pain Assessment   Currently in Pain? No/denies             Tobacco Use History[1]  Goals Met:  Independence with exercise equipment Exercise tolerated well No report of concerns or symptoms today Strength training completed today  Goals Unmet:  Not Applicable  Comments: Pt able to follow exercise prescription today without complaint.  Will continue to monitor for progression.    Dr. Oneil Pinal is Medical Director for [facility] Cardiac Rehabilitation.  Dr. Fuad Aleskerov is Medical Director for [facility] Pulmonary Rehabilitation.    [1]  Social History Tobacco Use  Smoking Status Former   Types: Cigarettes  Smokeless Tobacco Never  Tobacco Comments   light

## 2024-08-24 ENCOUNTER — Encounter

## 2024-08-24 DIAGNOSIS — Z951 Presence of aortocoronary bypass graft: Secondary | ICD-10-CM

## 2024-08-24 NOTE — Progress Notes (Signed)
 Daily Session Note  Patient Details  Name: Marc Myers MRN: [id number] Date of Birth: 1956/05/09 Referring Provider:   Flowsheet Row Cardiac Rehab from 07/27/2024 in [facility] Cardiac and Pulmonary Rehab  Referring Provider Dr. Evalene Lunger, MD    Encounter Date: 08/24/2024  Check In:  Session Check In - 08/24/24 0758       Check-In   Supervising physician immediately available to respond to emergencies See telemetry face sheet for immediately available ER MD    Location [facility]-Cardiac & Pulmonary Rehab    Staff Present Burnard Davenport [facility] Dyane BS, ACSM CEP, Exercise Physiologist;Kristen Coble RN,BC,MSN;Jason Elnor [facility]    Virtual Visit No    Medication changes reported     No    Fall or balance concerns reported    No    Warm-up and Cool-down Performed on first and last piece of equipment    Resistance Training Performed Yes    VAD Patient? No    PAD/SET Patient? No      Pain Assessment   Currently in Pain? No/denies             Tobacco Use History[1]  Goals Met:  Independence with exercise equipment Exercise tolerated well No report of concerns or symptoms today Strength training completed today  Goals Unmet:  Not Applicable  Comments: Pt able to follow exercise prescription today without complaint.  Will continue to monitor for progression.    Dr. Oneil Pinal is Medical Director for [facility] Cardiac Rehabilitation.  Dr. Fuad Aleskerov is Medical Director for [facility] Pulmonary Rehabilitation.    [1]  Social History Tobacco Use  Smoking Status Former   Types: Cigarettes  Smokeless Tobacco Never  Tobacco Comments   light

## 2024-08-26 ENCOUNTER — Encounter

## 2024-08-26 DIAGNOSIS — Z951 Presence of aortocoronary bypass graft: Secondary | ICD-10-CM | POA: Diagnosis not present

## 2024-08-26 NOTE — Progress Notes (Signed)
 Daily Session Note  Patient Details  Name: Marc Myers MRN: [id number] Date of Birth: [birth year] Referring Provider:   Flowsheet Row Cardiac Rehab from 07/27/2024 in [facility] Cardiac and Pulmonary Rehab  Referring Provider Dr. Evalene Lunger, MD    Encounter Date: 08/26/2024  Check In:  Session Check In - 08/26/24 0724       Check-In   Supervising physician immediately available to respond to emergencies See telemetry face sheet for immediately available ER MD    Location [facility]-Cardiac & Pulmonary Rehab    Staff Present Burnard Davenport RN,BSN,MPA;Joseph Rolinda RCP,RRT,BSRT;Noah Tickle, [place], Exercise Physiologist    Virtual Visit No    Medication changes reported     No    Fall or balance concerns reported    No    Warm-up and Cool-down Performed on first and last piece of equipment    Resistance Training Performed Yes    VAD Patient? No    PAD/SET Patient? No      Pain Assessment   Currently in Pain? No/denies             Tobacco Use History[1]  Goals Met:  Independence with exercise equipment Exercise tolerated well No report of concerns or symptoms today Strength training completed today  Goals Unmet:  Not Applicable  Comments: Pt able to follow exercise prescription today without complaint.  Will continue to monitor for progression.    Dr. Oneil Pinal is Medical Director for [facility] Cardiac Rehabilitation.  Dr. Fuad Aleskerov is Medical Director for [facility] Pulmonary Rehabilitation.    [1]  Social History Tobacco Use  Smoking Status Former   Types: Cigarettes  Smokeless Tobacco Never  Tobacco Comments   light

## 2024-08-31 ENCOUNTER — Encounter

## 2024-08-31 DIAGNOSIS — Z951 Presence of aortocoronary bypass graft: Secondary | ICD-10-CM

## 2024-08-31 NOTE — Progress Notes (Signed)
 Daily Session Note  Patient Details  Name: Marc Myers MRN: [id number] Date of Birth: September 19, 1955 Referring Provider:   Flowsheet Row Cardiac Rehab from 07/27/2024 in [facility] Cardiac and Pulmonary Rehab  Referring Provider Dr. Evalene Lunger, MD    Encounter Date: 08/31/2024  Check In:  Session Check In - 08/31/24 0740       Check-In   Supervising physician immediately available to respond to emergencies See telemetry face sheet for immediately available ER MD    Location [facility]-Cardiac & Pulmonary Rehab    Staff Present Burnard Davenport RN,BSN,MPA;Jason Elnor RDN,LDN;Joseph Rolinda NORWOOD HARMAN Cecilie Delores, BS, RRT, CPFT    Virtual Visit No    Medication changes reported     No    Fall or balance concerns reported    No    Warm-up and Cool-down Performed on first and last piece of equipment    Resistance Training Performed Yes    VAD Patient? No    PAD/SET Patient? No      Pain Assessment   Currently in Pain? No/denies             Tobacco Use History[1]  Goals Met:  Independence with exercise equipment Exercise tolerated well No report of concerns or symptoms today Strength training completed today  Goals Unmet:  Not Applicable  Comments: Pt able to follow exercise prescription today without complaint.  Will continue to monitor for progression.    Dr. Oneil Pinal is Medical Director for [facility] Cardiac Rehabilitation.  Dr. Fuad Aleskerov is Medical Director for [facility] Pulmonary Rehabilitation.    [1]  Social History Tobacco Use  Smoking Status Former   Types: Cigarettes  Smokeless Tobacco Never  Tobacco Comments   light

## 2024-09-02 ENCOUNTER — Encounter

## 2024-09-02 DIAGNOSIS — Z951 Presence of aortocoronary bypass graft: Secondary | ICD-10-CM

## 2024-09-02 NOTE — Progress Notes (Signed)
 Daily Session Note  Patient Details  Name: Marc Myers MRN: [id number] Date of Birth: February 26, 1956 Referring Provider:   Flowsheet Row Cardiac Rehab from 07/27/2024 in [facility] Cardiac and Pulmonary Rehab  Referring Provider Dr. Evalene Lunger, MD    Encounter Date: 09/02/2024  Check In:  Session Check In - 09/02/24 0724       Check-In   Supervising physician immediately available to respond to emergencies See telemetry face sheet for immediately available ER MD    Location [facility]-Cardiac & Pulmonary Rehab    Staff Present Burnard Davenport RN,BSN,MPA;Joseph Rolinda RCP,RRT,BSRT;Margaret Best, MS, Exercise Physiologist;Noah Tickle, BS, Exercise Physiologist    Virtual Visit No    Medication changes reported     No    Fall or balance concerns reported    No    Warm-up and Cool-down Performed on first and last piece of equipment    Resistance Training Performed Yes    VAD Patient? No    PAD/SET Patient? No      Pain Assessment   Currently in Pain? No/denies             Tobacco Use History[1]  Goals Met:  Independence with exercise equipment Exercise tolerated well No report of concerns or symptoms today Strength training completed today  Goals Unmet:  Not Applicable  Comments: Pt able to follow exercise prescription today without complaint.  Will continue to monitor for progression.    Dr. Oneil Pinal is Medical Director for [facility] Cardiac Rehabilitation.  Dr. Fuad Aleskerov is Medical Director for [facility] Pulmonary Rehabilitation.    [1]  Social History Tobacco Use  Smoking Status Former   Types: Cigarettes  Smokeless Tobacco Never  Tobacco Comments   light

## 2024-09-07 ENCOUNTER — Encounter: Payer: Self-pay | Attending: Cardiovascular Disease | Admitting: *Deleted

## 2024-09-07 DIAGNOSIS — Z951 Presence of aortocoronary bypass graft: Secondary | ICD-10-CM | POA: Insufficient documentation

## 2024-09-07 DIAGNOSIS — Z48812 Encounter for surgical aftercare following surgery on the circulatory system: Secondary | ICD-10-CM | POA: Insufficient documentation

## 2024-09-07 NOTE — Progress Notes (Signed)
 Daily Session Note  Patient Details  Name: Marc Myers MRN: [id number] Date of Birth: March 24, 1956 Referring Provider:   Flowsheet Row Cardiac Rehab from 07/27/2024 in [facility] Cardiac and Pulmonary Rehab  Referring Provider Dr. Evalene Lunger, MD    Encounter Date: 09/07/2024  Check In:  Session Check In - 09/07/24 0750       Check-In   Supervising physician immediately available to respond to emergencies See telemetry face sheet for immediately available ER MD    Location [facility]-Cardiac & Pulmonary Rehab    Staff Present Othel Durand, RN, BSN, CCRP;Jason Elnor RDN,LDN;Joseph [employer] BS, ACSM CEP, Exercise Physiologist    Virtual Visit No    Medication changes reported     No    Fall or balance concerns reported    No    Warm-up and Cool-down Performed on first and last piece of equipment    Resistance Training Performed Yes    VAD Patient? No    PAD/SET Patient? No      Pain Assessment   Currently in Pain? No/denies             Tobacco Use History[1]  Goals Met:  Independence with exercise equipment Exercise tolerated well No report of concerns or symptoms today  Goals Unmet:  Not Applicable  Comments: Pt able to follow exercise prescription today without complaint.  Will continue to monitor for progression.   Reviewed home exercise with pt today.  Pt plans to use [employer] for exercise.  Reviewed THR, pulse, RPE, sign and symptoms, pulse oximetery and when to call 911 or MD.  Also discussed weather considerations and indoor options.  Pt voiced understanding.    Dr. Oneil Pinal is Medical Director for [facility] Cardiac Rehabilitation.  Dr. Fuad Aleskerov is Medical Director for [facility] Pulmonary Rehabilitation.     [1]  Social History Tobacco Use  Smoking Status Former   Types: Cigarettes  Smokeless Tobacco Never  Tobacco Comments   light

## 2024-09-09 DIAGNOSIS — Z48812 Encounter for surgical aftercare following surgery on the circulatory system: Secondary | ICD-10-CM | POA: Diagnosis not present

## 2024-09-09 DIAGNOSIS — Z951 Presence of aortocoronary bypass graft: Secondary | ICD-10-CM

## 2024-09-09 NOTE — Progress Notes (Signed)
 Daily Session Note  Patient Details  Name: Marc Myers MRN: [id number] Date of Birth: [birth year] Referring Provider:   Flowsheet Row Cardiac Rehab from 07/27/2024 in [facility] Cardiac and Pulmonary Rehab  Referring Provider Dr. Evalene Lunger, MD    Encounter Date: 09/09/2024  Check In:  Session Check In - 09/09/24 0739       Check-In   Supervising physician immediately available to respond to emergencies See telemetry face sheet for immediately available ER MD    Location [facility]-Cardiac & Pulmonary Rehab    Staff Present Burnard Davenport RN,BSN,MPA;Maxon Conetta BS, Exercise Physiologist;Joseph Rolinda NORWOOD HARMAN Hobert Best, MS, Exercise Physiologist    Virtual Visit No    Medication changes reported     No    Fall or balance concerns reported    No    Warm-up and Cool-down Performed on first and last piece of equipment    Resistance Training Performed Yes    VAD Patient? No    PAD/SET Patient? No      Pain Assessment   Currently in Pain? No/denies             Tobacco Use History[1]  Goals Met:  Independence with exercise equipment Exercise tolerated well No report of concerns or symptoms today Strength training completed today  Goals Unmet:  Not Applicable  Comments: Pt able to follow exercise prescription today without complaint.  Will continue to monitor for progression.    Dr. Oneil Pinal is Medical Director for [facility] Cardiac Rehabilitation.  Dr. Fuad Aleskerov is Medical Director for [facility] Pulmonary Rehabilitation.    [1]  Social History Tobacco Use  Smoking Status Former   Types: Cigarettes  Smokeless Tobacco Never  Tobacco Comments   light

## 2024-09-11 ENCOUNTER — Encounter: Payer: Self-pay | Admitting: Emergency Medicine

## 2024-09-11 DIAGNOSIS — Z48812 Encounter for surgical aftercare following surgery on the circulatory system: Secondary | ICD-10-CM | POA: Diagnosis not present

## 2024-09-11 DIAGNOSIS — Z951 Presence of aortocoronary bypass graft: Secondary | ICD-10-CM

## 2024-09-11 NOTE — Progress Notes (Signed)
 Daily Session Note  Patient Details  Name: Marc Myers MRN: [id number] Date of Birth: 25-Nov-1955 Referring Provider:   Flowsheet Row Cardiac Rehab from 07/27/2024 in [facility] Cardiac and Pulmonary Rehab  Referring Provider Dr. Evalene Lunger, MD    Encounter Date: 09/11/2024  Check In:  Session Check In - 09/11/24 0736       Check-In   Supervising physician immediately available to respond to emergencies See telemetry face sheet for immediately available ER MD    Location [facility]-Cardiac & Pulmonary Rehab    Staff Present Leita Franks RN,BSN;Joseph [facility] [place], [place], Exercise Physiologist    Virtual Visit No    Medication changes reported     No    Fall or balance concerns reported    No    Warm-up and Cool-down Performed on first and last piece of equipment    Resistance Training Performed Yes    VAD Patient? No    PAD/SET Patient? No      Pain Assessment   Currently in Pain? No/denies             Tobacco Use History[1]  Goals Met:  Independence with exercise equipment Exercise tolerated well No report of concerns or symptoms today Strength training completed today  Goals Unmet:  Not Applicable  Comments: Pt able to follow exercise prescription today without complaint.  Will continue to monitor for progression.    Dr. Oneil Pinal is Medical Director for [facility] Cardiac Rehabilitation.  Dr. Fuad Aleskerov is Medical Director for [facility] Pulmonary Rehabilitation.    [1]  Social History Tobacco Use  Smoking Status Former   Types: Cigarettes  Smokeless Tobacco Never  Tobacco Comments   light

## 2024-09-14 ENCOUNTER — Encounter: Payer: Self-pay | Admitting: Emergency Medicine

## 2024-09-14 DIAGNOSIS — Z48812 Encounter for surgical aftercare following surgery on the circulatory system: Secondary | ICD-10-CM | POA: Diagnosis not present

## 2024-09-14 DIAGNOSIS — Z951 Presence of aortocoronary bypass graft: Secondary | ICD-10-CM

## 2024-09-14 NOTE — Progress Notes (Signed)
 Daily Session Note  Patient Details  Name: Marc Myers MRN: [id number] Date of Birth: 12-Apr-1956 Referring Provider:   Flowsheet Row Cardiac Rehab from 07/27/2024 in [facility] Cardiac and Pulmonary Rehab  Referring Provider Dr. Evalene Lunger, MD    Encounter Date: 09/14/2024  Check In:  Session Check In - 09/14/24 0749       Check-In   Supervising physician immediately available to respond to emergencies See telemetry face sheet for immediately available ER MD    Location [facility]-Cardiac & Pulmonary Rehab    Staff Present Leita Franks RN,BSN;Joseph [facility] Dyane BS, ACSM CEP, Exercise Physiologist;Jason Elnor RDN,LDN    Virtual Visit No    Medication changes reported     No    Fall or balance concerns reported    No    Warm-up and Cool-down Performed on first and last piece of equipment    Resistance Training Performed Yes    VAD Patient? No    PAD/SET Patient? No      Pain Assessment   Currently in Pain? No/denies             Tobacco Use History[1]  Goals Met:  Independence with exercise equipment Exercise tolerated well No report of concerns or symptoms today Strength training completed today  Goals Unmet:  Not Applicable  Comments: Pt able to follow exercise prescription today without complaint.  Will continue to monitor for progression.    Dr. Oneil Pinal is Medical Director for [facility] Cardiac Rehabilitation.  Dr. Fuad Aleskerov is Medical Director for [facility] Pulmonary Rehabilitation.    [1]  Social History Tobacco Use  Smoking Status Former   Types: Cigarettes  Smokeless Tobacco Never  Tobacco Comments   light

## 2024-09-15 NOTE — Progress Notes (Unsigned)
 "  Cardiology Clinic Note   Date: 09/18/2024 ID: Marc Myers, DOB Jun 21, 1956, MRN [id number]  Primary Cardiologist:  Evalene Lunger, MD  Chief Complaint   Marc Myers is a 69 y.o. male who presents to the clinic today for routine follow up.   Patient Profile   Marc Myers is followed by Dr. Gollan for the history outlined below.      Past medical history significant for: Dyspnea. Echo 05/06/2024: EF 55 to 60%.  No RWMA.  Grade I DD.  Normal global strain.  Normal RV size/function.  Mild MR/AI. Coronary CTA 05/14/2024: Coronary calcium  score 433 (79th percentile).  Severe mixed plaque stenosis proximal LAD.  Severe mixed plaque stenosis mid LAD.  Mild mixed plaque stenosis proximal D3.  Mild mixed plaque mid LCx.  Moderate soft plaque mid OM1.  Moderate mixed plaque proximal RCA just prior to acute marginal takeoff.  Moderate soft plaque ostial marginal.  Complex soft plaque  occlusion of mid RCA cannot exclude CTO.  FFR analysis shows evidence of significant stenosis in LAD and RCA.  Mid RCA is modeled as a CTO vessel. Recommend cardiac cath. LHC 05/19/2024: Severe three-vessel CAD including long segment of moderate to severe mid LAD disease up to 80 to 90%, moderate diffuse disease of LMCA and CTO of OM1 and distal RCA.  Mid LAD #1 90%, #2 50%, #3 80%.  OM1 100% CTO.  OM1 filled by collaterals from D2.  Proximal RCA 30%.  Mid RCA 50%.  Distal RCA 100% CTO with left-to-right and right to right collaterals.  RPDA filled by collaterals from first septal.  RPAV filled by collaterals from RV branch.  Outpatient CT surgery consult recommended. 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 Ectatic thoracic aorta. Postop A-fib. Hypertension.  Hyperlipidemia. Lipid panel 04/15/2024: Direct LDL 136, HDL 50, TG 162, total 201. PSVT. 5-day ZIO 04/19/2023: HR 47 to 184 bpm, average 67 bpm.  6 runs of PSVT longest 19 beats.  Occasional PACs 2.3%.  Rare PVCs.  No  A-fib/sustained arrhythmias. Prediabetes.  In summary, patient was initially evaluated by Dr. Barbaraann on 01/23/2023 for dizziness.  He had been seen in the ED for dizziness and lightheadedness and found to be severely hypertensive.  Troponins negative x 2.  EKG normal.  He reported being very active going to the gym 5 to 6 days a week to exercise walking 3 miles on an inclined treadmill.  He denied chest pain or difficulty breathing with exertion. The morning he presented to the ED he developed lightheadedness while walking on the treadmill.  He sat down and symptoms improved.  His wife requested that he present to the ED for evaluation.  He denied palpitations during episode of dizziness.  He reported not drinking water  or eating breakfast prior to exercise.  Patient establish care with Dr. Gollan on 07/01/2023 for dizziness and PSVT.  He denied further episodes of dizziness.  No medication changes were made.  Patient was seen by PCP for routine visit.  Echo was ordered for dyspnea and demonstrated normal LV function with Grade I DD.   Patient was seen in the office on 05/12/2024 for evaluation of progressive dyspnea. He denied frank chest pain but felt there were times he had a greater awareness across his chest with the shortness of breath.  Patient reported dyspnea had progressed to the point minimal exertion caused symptoms.He was started on isosorbide  and scheduled for coronary CTA.  Coronary CTA with  FFR demonstrated evidence of significant stenosis in LAD and RCA and possible CTO of mid RCA.  He was seen in follow-up on 05/15/2024 and scheduled for LHC.  LHC demonstrated severe three-vessel CAD as detailed above and he was referred to CT surgery for outpatient consult.  He underwent CABG x 4 on 05/26/2024.  Hospital course complicated by left pneumothorax after chest tube removal requiring replacement of chest tube.  He developed postop A-fib with RVR and was started on IV amiodarone  and subsequently  transition to oral amiodarone .  Chest tube was removed and follow-up x-ray showed stable small left pleural effusion, stable bibasilar atelectasis and a tiny left apical pneumothorax.  Patient was discharged on 06/02/2024.   Patient was last seen in the office by me on 06/18/2024 for hospital follow up. He reported mild tenderness of sternal incision with occasional pulling sensation. He had returned to work but was pacing himself and ending his day early as needed. He was increasing his daily walks with good tolerance. He was bradycardiac at the time of his visit and metoprolol  was decreased to 12.5 mg bid.  He was very interested in attending cardiac rehab.      History of Present Illness    Today, patient is accompanied by his wife. He is doing very well. He continues to attend cardiac rehab 3 days a week and is planning to start going back to the gym the other 2 days. Patient denies shortness of breath, dyspnea on exertion, lower extremity edema, orthopnea or PND. He reports over Thanksgiving he had a few day history of left ankle edema. He reports prior to noticing the edema he did a balance test by standing on left leg at cardiac rehab. After that he noted vague ankle soreness. He then traveled  by plane to the [place] and had ankle soreness and edema for a few days. He wore an ankle brace and edema has not returned. He still has mild soreness of ankle. No chest pain, pressure, or tightness. No palpitations.  His BP is elevated today. He does not check it at home but states BP at cardiac rehab is typically normal. He reports mild lightheadedness with quick position changes. No presyncope or syncope.     ROS: All other systems reviewed and are otherwise negative except as noted in History of Present Illness.  EKGs/Labs Reviewed       EKG not ordered today.   05/22/2024: ALT 36; AST 29 06/02/2024: BUN 23; Creatinine, Ser 0.94; Potassium 3.7; Sodium 138   05/31/2024: Hemoglobin 11.4; WBC 7.7     Risk Assessment/Calculations      HYPERTENSION CONTROL Vitals:   09/18/24 0755 09/18/24 1043  BP: (!) 172/90 (!) 150/80    The patient's blood pressure is elevated above target today.  In order to address the patient's elevated BP: Blood pressure will be monitored at home to determine if medication changes need to be made.           Physical Exam    VS:  BP (!) 150/80 (BP Location: Left Arm, Patient Position: Sitting, Cuff Size: Normal)   Pulse 74   Resp 18   Ht 5' 9 (1.753 m)   Wt 217 lb 9.6 oz (98.7 kg)   SpO2 98%   BMI 32.13 kg/m  , BMI Body mass index is 32.13 kg/m.  GEN: Well nourished, well developed, in no acute distress. Neck: No JVD or carotid bruits. Cardiac:  RRR.  No murmur. No rubs or gallops.  Respiratory:  Respirations regular and unlabored. Clear to auscultation without rales, wheezing or rhonchi. GI: Soft, nontender, nondistended. Extremities: Radials/DP/PT 2+ and equal bilaterally. No clubbing or cyanosis. No edema   Skin: Warm and dry, no rash. Neuro: Strength intact.  Assessment & Plan   CAD Echo September 2025 demonstrated normal LV/RV function, Grade I DD, mild MR/AI.  LHC 05/19/2024 demonstrated severe three-vessel CAD.  S/p CABG x 4 05/26/2024.  Patient denies chest pain, pressure or tightness. He is attending cardiac rehab 3 days a week with good tolerance and plans to start going to the gym the other 2 days.  - Continue aspirin , Plavix , metoprolol , rosuvastatin , Zetia .   Palpitations/PSVT/postop A-fib 5-day ZIO August 2024 demonstrated HR 47 to 184 bpm, average 67 bpm, 6 runs of PSVT longest 19 beats, occasional PACs 2.3%, rare PVCs. Patient  denies palpitations.  - Stop amiodarone .  - Continue metoprolol .    Hypertension BP today 172/90 on intake and 150/80 on my recheck. He does not check BP at home but states it is usually normal at cardiac rehab. He reports occasional lightheadedness with quick position changes. No headaches  reported. Discussed checking BP at home. If elevated >130/80 consistently contact the office.  - Goal BP <130/80. Contact office through MyChart for BP readings above goal.   - Continue losartan , metoprolol .   Hyperlipidemia LDL 136 August 2025.  - Continue rosuvastatin  and Zetia . - Repeat lipid panel, CMP today.   Disposition: Lipid panel, CMP, CBC today. Stop amiodarone . Return in 6 months or sooner as needed.          Signed, Barnie HERO. Leif Loflin, DNP, NP-C  "

## 2024-09-16 DIAGNOSIS — Z951 Presence of aortocoronary bypass graft: Secondary | ICD-10-CM

## 2024-09-16 DIAGNOSIS — Z48812 Encounter for surgical aftercare following surgery on the circulatory system: Secondary | ICD-10-CM | POA: Diagnosis not present

## 2024-09-16 NOTE — Progress Notes (Signed)
 Cardiac Individual Treatment Plan  Patient Details  Name: Marc Myers MRN: [id number] Date of Birth: 11-Sep-1955 Referring Provider:   Flowsheet Row Cardiac Rehab from 07/27/2024 in [facility] Cardiac and Pulmonary Rehab  Referring Provider Dr. Evalene Lunger, MD    Initial Encounter Date:  Flowsheet Row Cardiac Rehab from 07/27/2024 in [facility] Cardiac and Pulmonary Rehab  Date 07/27/24    Visit Diagnosis: S/P CABG x 4  Patient's Home Medications on Admission: Current Medications[1]  Past Medical History: Past Medical History:  Diagnosis Date   Anxiety    Blood in stool    on and off since 2014    COVID-19    2021 and 11 or 08/2021   Dyspnea    with activity   Elevated blood pressure reading    Fatty liver    Heart murmur    Hyperlipidemia    Hypertension    Pre-diabetes     Tobacco Use: Tobacco Use History[2]  Labs: Review Flowsheet  More data exists      Latest Ref Rng & Units 03/13/2023 01/09/2024 04/15/2024 05/22/2024 05/26/2024  Labs for ITP Cardiac and Pulmonary Rehab  Cholestrol 0 - 200 mg/dL 771  799  798  - -  LDL (calc) 0 - 99 mg/dL 843  872  880  - -  Direct LDL mg/dL - 865.9  863.9  - -  HDL-C >39.00 mg/dL 53.89  50.69  50.09  - -  Trlycerides 0.0 - 149.0 mg/dL 869.9  880.9  837.9  - -  Hemoglobin A1c 4.8 - 5.6 % - 6.5  6.4  5.7  -  PH, Arterial 7.35 - 7.45 - - - - 7.293  7.272  7.358  7.421  7.301  7.343   PCO2 arterial 32 - 48 mmHg - - - - 47.9  49.2  41.6  35.1  37.7  43.1   Bicarbonate 20.0 - 28.0 mmol/L - - - - 23.2  22.8  23.6  22.8  18.6  23.4   TCO2 22 - 32 mmol/L - - - - 25  24  25  23  24  25  27  29  20  25  26  25    Acid-base deficit 0.0 - 2.0 mmol/L - - - - 4.0  4.0  2.0  1.0  7.0  2.0   O2 Saturation % - - - - 92  94  96  99  100  100     Details       Multiple values from one day are sorted in reverse-chronological order          Exercise Target Goals: Exercise Program Goal: Individual exercise prescription set using results from  initial 6 min walk test and THRR while considering  patients activity barriers and safety.   Exercise Prescription Goal: Initial exercise prescription builds to 30-45 minutes a day of aerobic activity, 2-3 days per week.  Home exercise guidelines will be given to patient during program as part of exercise prescription that the participant will acknowledge.   Education: Aerobic Exercise: - Group verbal and visual presentation on the components of exercise prescription. Introduces F.I.T.T principle from ACSM for exercise prescriptions.  Reviews F.I.T.T. principles of aerobic exercise including progression. Written material provided at class time.   Education: Resistance Exercise: - Group verbal and visual presentation on the components of exercise prescription. Introduces F.I.T.T principle from ACSM for exercise prescriptions  Reviews F.I.T.T. principles of resistance exercise including progression. Written material provided at class time.  Education: Exercise & Equipment Safety: - Individual verbal instruction and demonstration of equipment use and safety with use of the equipment. Flowsheet Row Cardiac Rehab from 07/27/2024 in [facility] Cardiac and Pulmonary Rehab  Date 07/27/24  Educator [facility]  Instruction Review Code 1- Verbalizes Understanding    Education: Exercise Physiology & General Exercise Guidelines: - Group verbal and written instruction with models to review the exercise physiology of the cardiovascular system and associated critical values. Provides general exercise guidelines with specific guidelines to those with heart or lung disease. Written material provided at class time.   Education: Flexibility, Balance, Mind/Body Relaxation: - Group verbal and visual presentation with interactive activity on the components of exercise prescription. Introduces F.I.T.T principle from ACSM for exercise prescriptions. Reviews F.I.T.T. principles of flexibility and balance exercise training  including progression. Also discusses the mind body connection.  Reviews various relaxation techniques to help reduce and manage stress (i.e. Deep breathing, progressive muscle relaxation, and visualization). Balance handout provided to take home. Written material provided at class time.   Activity Barriers & Risk Stratification:  Activity Barriers & Cardiac Risk Stratification - 07/27/24 1149       Activity Barriers & Cardiac Risk Stratification   Activity Barriers Joint Problems    Cardiac Risk Stratification High          6 Minute Walk:  6 Minute Walk     Row Name 07/27/24 1035         6 Minute Walk   Phase Initial     Distance 1355 feet     Walk Time 6 minutes     # of Rest Breaks 0     MPH 2.57     METS 2.89     RPE 9     Perceived Dyspnea  0     VO2 Peak 10.13     Symptoms No     Resting HR 57 bpm     Resting BP 148/82     Resting Oxygen Saturation  96 %     Exercise Oxygen Saturation  during 6 min walk 95 %     Max Ex. HR 75 bpm     Max Ex. BP 154/84     2 Minute Post BP 152/84        Oxygen Initial Assessment:   Oxygen Re-Evaluation:   Oxygen Discharge (Final Oxygen Re-Evaluation):   Initial Exercise Prescription:  Initial Exercise Prescription - 07/27/24 1100       Date of Initial Exercise RX and Referring Provider   Date 07/27/24    Referring Provider Dr. Timothy Gollan, MD      Oxygen   Maintain Oxygen Saturation 88% or higher      Treadmill   MPH 2.5    Grade 0    Minutes 15    METs 2.91      REL-XR   Level 3    Speed 50    Minutes 15    METs 2.89      T5 Nustep   Level 2    SPM 80    Minutes 15    METs 2.89      Prescription Details   Frequency (times per week) 3    Duration Progress to 30 minutes of continuous aerobic without signs/symptoms of physical distress      Intensity   THRR 40-80% of Max Heartrate 95-134    Ratings of Perceived Exertion 11-13    Perceived Dyspnea 0-4      Progression  Progression  Continue to progress workloads to maintain intensity without signs/symptoms of physical distress.      Resistance Training   Training Prescription Yes    Weight 5 lb    Reps 10-15          Perform Capillary Blood Glucose checks as needed.  Exercise Prescription Changes:   Exercise Prescription Changes     Row Name 07/27/24 1100 08/19/24 1100 09/07/24 0800 09/07/24 0900 09/15/24 1500     Response to Exercise   Blood Pressure (Admit) 148/82 112/60 -- 128/70 124/78   Blood Pressure (Exercise) 154/84 148/70 -- 164/74 --   Blood Pressure (Exit) 152/84 110/70 -- 118/60 112/70   Heart Rate (Admit) 57 bpm 55 bpm -- 52 bpm 60 bpm   Heart Rate (Exercise) 75 bpm 148 bpm -- 102 bpm 127 bpm   Heart Rate (Exit) 60 bpm 65 bpm -- 73 bpm 73 bpm   Oxygen Saturation (Admit) 96 % -- -- -- --   Oxygen Saturation (Exercise) 95 % -- -- -- --   Rating of Perceived Exertion (Exercise) 9 13 -- 14 14   Perceived Dyspnea (Exercise) 0 -- -- -- --   Symptoms none none none none none   Comments Results 1st 2 weeks of exercise sessions -- -- --   Duration -- Progress to 30 minutes of  aerobic without signs/symptoms of physical distress Progress to 30 minutes of  aerobic without signs/symptoms of physical distress Progress to 30 minutes of  aerobic without signs/symptoms of physical distress Progress to 30 minutes of  aerobic without signs/symptoms of physical distress   Intensity -- THRR unchanged THRR unchanged THRR unchanged THRR unchanged     Progression   Progression -- Continue to progress workloads to maintain intensity without signs/symptoms of physical distress. Continue to progress workloads to maintain intensity without signs/symptoms of physical distress. Continue to progress workloads to maintain intensity without signs/symptoms of physical distress. Continue to progress workloads to maintain intensity without signs/symptoms of physical distress.   Average METs -- 3.77 3.77 5.01 6.06      Resistance Training   Training Prescription -- -- -- Yes --   Weight -- 5 lb 5 lb 5 lb 5 lb   Reps -- 10-15 10-15 10-15 10-15     Interval Training   Interval Training -- No No No No     Treadmill   MPH -- 3.4 3.4 3.5 3.8   Grade -- 2 2 3.5 5.5   Minutes -- 15 15 15 15    METs -- 4.54 4.54 5.37 6.79     REL-XR   Level -- 4 4 9 10    Minutes -- 15 15 15 15      Biostep-RELP   Level -- 4 4 5 14    SPM -- 50 50 -- --   Minutes -- 15 15 15 15    METs -- 4 4 5 6      Home Exercise Plan   Plans to continue exercise at -- -- [employer] (comment)  [facility]  [facility] [employer] (comment)  [employer]   Frequency -- -- Add 2 additional days to program exercise sessions. Add 2 additional days to program exercise sessions. Add 2 additional days to program exercise sessions.   Initial Home Exercises Provided -- -- 09/07/24 09/07/24 09/07/24     Oxygen   Maintain Oxygen Saturation -- 88% or higher -- 88% or higher 88% or higher      Exercise  Comments:   Exercise Comments     Row Name 08/05/24 [phone]           Exercise Comments First full day of exercise!  Patient was oriented to gym and equipment including functions, settings, policies, and procedures.  Patient's individual exercise prescription and treatment plan were reviewed.  All starting workloads were established based on the results of the 6 minute walk test done at initial orientation visit.  The plan for exercise progression was also introduced and progression will be customized based on patient's performance and goals.          Exercise Goals and Review:   Exercise Goals     Row Name 07/27/24 1149             Exercise Goals   Increase Physical Activity Yes       Intervention Provide advice, education, support and counseling about physical activity/exercise needs.;Develop an individualized exercise prescription for aerobic and resistive training based on initial evaluation  findings, risk stratification, comorbidities and participant's personal goals.       Expected Outcomes Short Term: Attend rehab on a regular basis to increase amount of physical activity.;Long Term: Add in home exercise to make exercise part of routine and to increase amount of physical activity.;Long Term: Exercising regularly at least 3-5 days a week.       Increase Strength and Stamina Yes       Intervention Develop an individualized exercise prescription for aerobic and resistive training based on initial evaluation findings, risk stratification, comorbidities and participant's personal goals.;Provide advice, education, support and counseling about physical activity/exercise needs.       Expected Outcomes Short Term: Increase workloads from initial exercise prescription for resistance, speed, and METs.;Short Term: Perform resistance training exercises routinely during rehab and add in resistance training at home;Long Term: Improve cardiorespiratory fitness, muscular endurance and strength as measured by increased METs and functional capacity ( )       Able to understand and use rate of perceived exertion (RPE) scale Yes       Intervention Provide education and explanation on how to use RPE scale       Expected Outcomes Short Term: Able to use RPE daily in rehab to express subjective intensity level;Long Term:  Able to use RPE to guide intensity level when exercising independently       Able to understand and use Dyspnea scale Yes       Intervention Provide education and explanation on how to use Dyspnea scale       Expected Outcomes Short Term: Able to use Dyspnea scale daily in rehab to express subjective sense of shortness of breath during exertion;Long Term: Able to use Dyspnea scale to guide intensity level when exercising independently       Knowledge and understanding of Target Heart Rate Range (THRR) Yes       Intervention Provide education and explanation of THRR including how the numbers  were predicted and where they are located for reference       Expected Outcomes Long Term: Able to use THRR to govern intensity when exercising independently;Short Term: Able to state/look up THRR;Short Term: Able to use daily as guideline for intensity in rehab       Able to check pulse independently Yes       Intervention Review the importance of being able to check your own pulse for safety during independent exercise;Provide education and demonstration on how to check pulse in carotid and radial arteries.  Expected Outcomes Long Term: Able to check pulse independently and accurately;Short Term: Able to explain why pulse checking is important during independent exercise       Understanding of Exercise Prescription Yes       Intervention Provide education, explanation, and written materials on patient's individual exercise prescription       Expected Outcomes Short Term: Able to explain program exercise prescription;Long Term: Able to explain home exercise prescription to exercise independently          Exercise Goals Re-Evaluation :  Exercise Goals Re-Evaluation     Row Name 08/05/24 0743 08/19/24 1154 09/07/24 0809 09/07/24 0938 09/15/24 1507     Exercise Goal Re-Evaluation   Exercise Goals Review Increase Physical Activity;Able to understand and use rate of perceived exertion (RPE) scale;Knowledge and understanding of Target Heart Rate Range (THRR);Understanding of Exercise Prescription;Increase Strength and Stamina;Able to understand and use Dyspnea scale;Able to check pulse independently Increase Physical Activity;Understanding of Exercise Prescription;Increase Strength and Stamina Increase Physical Activity;Increase Strength and Stamina;Able to understand and use rate of perceived exertion (RPE) scale;Knowledge and understanding of Target Heart Rate Range (THRR);Able to understand and use Dyspnea scale;Able to check pulse independently;Understanding of Exercise Prescription Increase  Physical Activity;Understanding of Exercise Prescription;Increase Strength and Stamina Increase Physical Activity;Understanding of Exercise Prescription;Increase Strength and Stamina   Comments Reviewed RPE and dyspnea scale, THR and program prescription with pt today.  Pt voiced understanding and was given a copy of goals to take home. Marc is off to a good start in the program and completed his first 2 weeks of exercise sessions in this review. He walked at a speed of 3.4 mph and 2% incline. He worked at level 4 on the XR and biostep. We will continue to monitor his progress in the program. Reviewed home exercise with pt today.  Pt plans to use [employer] for exercise.  Reviewed THR, pulse, RPE, sign and symptoms, pulse oximetery and when to call 911 or MD.  Also discussed weather considerations and indoor options.  Pt voiced understanding. Marc is doing well in rehab. He increased is workload on the treadmill to a speed of 3.5 mph and incline of 3.5%. He also increased to level 9 on the XR and leve 5 on the biostep. We will continue to monitor his progress in the program. Marc is doing well in rehab. He has been able to increase his treadmill workload to a speed of 3. and 5.5% incline. He was also able to increase from level 5 on the biostep to level 14. We will continue to monitor his progress in the program.   Expected Outcomes Short: Use RPE daily to regulate intensity. Long: Follow program prescription in THR. Short: Continue to follow current exercise prescription. Long: Continue exercise to improve strength and stamina. Short: add 1-2 days a week of exercise on off days of rehab. Long: maintain independent exercise routine upon graduation from cardaic rehab. Short: Continue to progressively increase workloads. Long: Continue exercise to improve strength and stamina. Short: Continue to progressively increase workloads. Long: Continue exercise to improve strength and stamina.      Discharge  Exercise Prescription (Final Exercise Prescription Changes):  Exercise Prescription Changes - 09/15/24 1500       Response to Exercise   Blood Pressure (Admit) 124/78    Blood Pressure (Exit) 112/70    Heart Rate (Admit) 60 bpm    Heart Rate (Exercise) 127 bpm    Heart Rate (Exit) 73 bpm    Rating  of Perceived Exertion (Exercise) 14    Symptoms none    Duration Progress to 30 minutes of  aerobic without signs/symptoms of physical distress    Intensity THRR unchanged      Progression   Progression Continue to progress workloads to maintain intensity without signs/symptoms of physical distress.    Average METs 6.06      Resistance Training   Weight 5 lb    Reps 10-15      Interval Training   Interval Training No      Treadmill   MPH 3.8    Grade 5.5    Minutes 15    METs 6.79      REL-XR   Level 10    Minutes 15      Biostep-RELP   Level 14    Minutes 15    METs 6      Home Exercise Plan   Plans to continue exercise at [employer] (comment)   [employer]   Frequency Add 2 additional days to program exercise sessions.    Initial Home Exercises Provided 09/07/24      Oxygen   Maintain Oxygen Saturation 88% or higher          Nutrition:  Target Goals: Understanding of nutrition guidelines, daily intake of sodium 1500mg , cholesterol 200mg , calories 30% from fat and 7% or less from saturated fats, daily to have 5 or more servings of fruits and vegetables.  Education: Nutrition 1 -Group instruction provided by verbal, written material, interactive activities, discussions, models, and posters to present general guidelines for heart healthy nutrition including macronutrients, label reading, and promoting whole foods over processed counterparts. Education serves as pensions consultant of discussion of heart healthy eating for all. Written material provided at class time.    Education: Nutrition 2 -Group instruction provided by verbal, written material, interactive  activities, discussions, models, and posters to present general guidelines for heart healthy nutrition including sodium, cholesterol, and saturated fat. Providing guidance of habit forming to improve blood pressure, cholesterol, and body weight. Written material provided at class time.     Biometrics:  Pre Biometrics - 07/27/24 1150       Pre Biometrics   Height 5' 9.25 (1.759 m)    Weight 213 lb 14.4 oz (97 kg)    Waist Circumference 42 inches    Hip Circumference 41 inches    Waist to Hip Ratio 1.02 %    BMI (Calculated) 31.36    Single Leg Stand 30 seconds           Nutrition Therapy Plan and Nutrition Goals:  Nutrition Therapy & Goals - 07/27/24 1108       Nutrition Therapy   Diet Cardiac, Low Na    Protein (specify units) 90    Fiber 30 grams    Whole Grain Foods 3 servings    Saturated Fats 15 max. grams    Fruits and Vegetables 5 servings/day    Sodium 2 grams      Personal Nutrition Goals   Nutrition Goal Read labels and reduce sodium intake to below 2300mg . Ideally 1500mg  per day.    Personal Goal #2 Reduce saturated fat, less than 12g per day. Replace bad fats for more heart healthy fats.    Personal Goal #3 Eat 15-30gProtein and 30-60gCarbs at each meal.    Comments Patient drinking water  and likes milk. Drinks a gallon in ~5 days. Says he thinks its 2%. Spoke with him about fat content in milk and  encouraged him try a lower percent milk. He says he has been eating poorly as of late because of all the stress and busy appointments. But he is looking to get back to normal where he was more on top of eating better. Reviewed Mediterranean diet handout. Educated on type of fats, sources and how to read labels. Brainstormed a few meal ideas to help eat more balanced meals with less sodium and saturated fat      Intervention Plan   Intervention Prescribe, educate and counsel regarding individualized specific dietary modifications aiming towards targeted core components  such as weight, hypertension, lipid management, diabetes, heart failure and other comorbidities.;Nutrition handout(s) given to patient.    Expected Outcomes Short Term Goal: Understand basic principles of dietary content, such as calories, fat, sodium, cholesterol and nutrients.;Short Term Goal: A plan has been developed with personal nutrition goals set during dietitian appointment.;Long Term Goal: Adherence to prescribed nutrition plan.          Nutrition Assessments:  MEDIFICTS Score Key: >=70 Need to make dietary changes  40-70 Heart Healthy Diet <= 40 Therapeutic Level Cholesterol Diet   Picture Your Plate Scores: <59 Unhealthy dietary pattern with much room for improvement. 41-50 Dietary pattern unlikely to meet recommendations for good health and room for improvement. 51-60 More healthful dietary pattern, with some room for improvement.  >60 Healthy dietary pattern, although there may be some specific behaviors that could be improved.    Nutrition Goals Re-Evaluation:  Nutrition Goals Re-Evaluation     Row Name 09/07/24 0749             Goals   Comment Marc reports that he eats balanced meals and does not eat many sweets or salt. He makes sure to get lean protein and feels he is doing well with a heart healhty diet.       Expected Outcome Short: continue to limit sweets and salt. Long: maintain heart healthy diet.          Nutrition Goals Discharge (Final Nutrition Goals Re-Evaluation):  Nutrition Goals Re-Evaluation - 09/07/24 0749       Goals   Comment Marc reports that he eats balanced meals and does not eat many sweets or salt. He makes sure to get lean protein and feels he is doing well with a heart healhty diet.    Expected Outcome Short: continue to limit sweets and salt. Long: maintain heart healthy diet.          Psychosocial: Target Goals: Acknowledge presence or absence of significant depression and/or stress, maximize coping skills, provide  positive support system. Participant is able to verbalize types and ability to use techniques and skills needed for reducing stress and depression.   Education: Stress, Anxiety, and Depression - Group verbal and visual presentation to define topics covered.  Reviews how body is impacted by stress, anxiety, and depression.  Also discusses healthy ways to reduce stress and to treat/manage anxiety and depression. Written material provided at class time.   Education: Sleep Hygiene -Provides group verbal and written instruction about how sleep can affect your health.  Define sleep hygiene, discuss sleep cycles and impact of sleep habits. Review good sleep hygiene tips.   Initial Review & Psychosocial Screening:  Initial Psych Review & Screening - 07/27/24 1055       Initial Review   Current issues with Current Stress Concerns      Family Dynamics   Good Support System? Yes      Barriers  Psychosocial barriers to participate in program There are no identifiable barriers or psychosocial needs.      Screening Interventions   Interventions Encouraged to exercise;Provide feedback about the scores to participant;To provide support and resources with identified psychosocial needs    Expected Outcomes Short Term goal: Utilizing psychosocial counselor, staff and physician to assist with identification of specific Stressors or current issues interfering with healing process. Setting desired goal for each stressor or current issue identified.;Long Term Goal: Stressors or current issues are controlled or eliminated.;Short Term goal: Identification and review with participant of any Quality of Life or Depression concerns found by scoring the questionnaire.;Long Term goal: The participant improves quality of Life and PHQ9 Scores as seen by post scores and/or verbalization of changes          Quality of Life Scores:   Scores of 19 and below usually indicate a poorer quality of life in these areas.  A  difference of  2-3 points is a clinically meaningful difference.  A difference of 2-3 points in the total score of the Quality of Life Index has been associated with significant improvement in overall quality of life, self-image, physical symptoms, and general health in studies assessing change in quality of life.  PHQ-9: Review Flowsheet  More data exists      08/19/2024 07/27/2024 06/08/2024 06/03/2024 04/15/2024  Depression screen PHQ 2/9  Decreased Interest 0 0 0 0 0  Down, Depressed, Hopeless 0 0 0 0 0  PHQ - 2 Score 0 0 0 0 0  Altered sleeping 0 2 0 - -  Tired, decreased energy 0 0 0 - -  Change in appetite 0 0 0 - -  Feeling bad or failure about yourself  0 0 0 - -  Trouble concentrating 0 0 0 - -  Moving slowly or fidgety/restless 0 0 0 - -  Suicidal thoughts 0 0 0 - -  PHQ-9 Score 0 2 0  - -  Difficult doing work/chores Not difficult at all Not difficult at all Not difficult at all - -    Details       Data saved with a previous flowsheet row definition        Interpretation of Total Score  Total Score Depression Severity:  1-4 = Minimal depression, 5-9 = Mild depression, 10-14 = Moderate depression, 15-19 = Moderately severe depression, 20-27 = Severe depression   Psychosocial Evaluation and Intervention:  Psychosocial Evaluation - 07/27/24 1056       Psychosocial Evaluation & Interventions   Interventions Encouraged to exercise with the program and follow exercise prescription    Comments 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 He states this came as a surprise to him since he doesn't have a history of heart issues. He has been working with doctors for a year and half trying to figure out why he was having some symptoms and it was revealed after the most recent test that he needed surgery and it was his heart. 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 He notes it has been some stress navigating this big life change. He  enjoys going on his boat at [employer] and it is his main stress reliever. He states he has never slept consistent hours and mentions it hasn't changed since surgery. He is ready to get back to exercising and increasing his stamina    Expected Outcomes Short: attend cardiac rehab for education and exercise  Long: develop and maintain positive self care habits    Continue Psychosocial Services  Follow up required by staff          Psychosocial Re-Evaluation:  Psychosocial Re-Evaluation     Row Name 09/07/24 [phone]             Psychosocial Re-Evaluation   Current issues with Current Stress Concerns;Current Sleep Concerns       Comments Marc reports no changes or new concerns with stress, sleep, or mental health. He reports he does not always sleep good but this has been his pattern for a long time. He feels like he can function well during the day and continues to have a good supprt system.       Expected Outcomes Short: continue to attend cardiac rehab for the mental health benefits of exercise. Long: maintain good mental health routine.       Interventions Encouraged to attend Cardiac Rehabilitation for the exercise       Continue Psychosocial Services  Follow up required by staff          Psychosocial Discharge (Final Psychosocial Re-Evaluation):  Psychosocial Re-Evaluation - 09/07/24 0758       Psychosocial Re-Evaluation   Current issues with Current Stress Concerns;Current Sleep Concerns    Comments Marc reports no changes or new concerns with stress, sleep, or mental health. He reports he does not always sleep good but this has been his pattern for a long time. He feels like he can function well during the day and continues to have a good supprt system.    Expected Outcomes Short: continue to attend cardiac rehab for the mental health benefits of exercise. Long: maintain good mental health routine.    Interventions Encouraged to attend Cardiac Rehabilitation for the exercise     Continue Psychosocial Services  Follow up required by staff          Vocational Rehabilitation: Provide vocational rehab assistance to qualifying candidates.   Vocational Rehab Evaluation & Intervention:  Vocational Rehab - 07/27/24 1023       Initial Vocational Rehab Evaluation & Intervention   Assessment shows need for Vocational Rehabilitation No          Education: Education Goals: Education classes will be provided on a variety of topics geared toward better understanding of heart health and risk factor modification. Participant will state understanding/return demonstration of topics presented as noted by education test scores.  Learning Barriers/Preferences:  Learning Barriers/Preferences - 07/27/24 1023       Learning Barriers/Preferences   Learning Barriers None    Learning Preferences None          General Cardiac Education Topics:  AED/CPR: - Group verbal and written instruction with the use of models to demonstrate the basic use of the AED with the basic ABC's of resuscitation.   Test and Procedures: - Group verbal and visual presentation and models provide information about basic cardiac anatomy and function. Reviews the testing methods done to diagnose heart disease and the outcomes of the test results. Describes the treatment choices: Medical Management, Angioplasty, or Coronary Bypass Surgery for treating various heart conditions including Myocardial Infarction, Angina, Valve Disease, and Cardiac Arrhythmias. Written material provided at class time.   Medication Safety: - Group verbal and visual instruction to review commonly prescribed medications for heart and lung disease. Reviews the medication, class of the drug, and side effects. Includes the steps to properly store meds and maintain the prescription regimen. Written material provided at  class time.   Intimacy: - Group verbal instruction through game format to discuss how heart and lung disease  can affect sexual intimacy. Written material provided at class time.   Know Your Numbers and Heart Failure: - Group verbal and visual instruction to discuss disease risk factors for cardiac and pulmonary disease and treatment options.  Reviews associated critical values for Overweight/Obesity, Hypertension, Cholesterol, and Diabetes.  Discusses basics of heart failure: signs/symptoms and treatments.  Introduces Heart Failure Zone chart for action plan for heart failure. Written material provided at class time.   Infection Prevention: - Provides verbal and written material to individual with discussion of infection control including proper hand washing and proper equipment cleaning during exercise session. Flowsheet Row Cardiac Rehab from 07/27/2024 in [facility] Cardiac and Pulmonary Rehab  Date 07/27/24  Educator [facility]  Instruction Review Code 1- Verbalizes Understanding    Falls Prevention: - Provides verbal and written material to individual with discussion of falls prevention and safety. Flowsheet Row Cardiac Rehab from 07/27/2024 in [facility] Cardiac and Pulmonary Rehab  Date 07/27/24  Educator [facility]  Instruction Review Code 1- Verbalizes Understanding    Other: -Provides group and verbal instruction on various topics (see comments)   Knowledge Questionnaire Score:   Core Components/Risk Factors/Patient Goals at Admission:  Personal Goals and Risk Factors at Admission - 07/27/24 1020       Core Components/Risk Factors/Patient Goals on Admission    Weight Management Yes;Weight Loss    Intervention Weight Management: Develop a combined nutrition and exercise program designed to reach desired caloric intake, while maintaining appropriate intake of nutrient and fiber, sodium and fats, and appropriate energy expenditure required for the weight goal.;Weight Management: Provide education and appropriate resources to help participant work on and attain dietary goals.    Goal Weight: Long Term 190 lb  (86.2 kg)    Expected Outcomes Long Term: Adherence to nutrition and physical activity/exercise program aimed toward attainment of established weight goal;Short Term: Continue to assess and modify interventions until short term weight is achieved;Weight Loss: Understanding of general recommendations for a balanced deficit meal plan, which promotes 1-2 lb weight loss per week and includes a negative energy balance of [phone] kcal/d;Understanding recommendations for meals to include 15-35% energy as protein, 25-35% energy from fat, 35-60% energy from carbohydrates, less than 200mg  of dietary cholesterol, 20-35 gm of total fiber daily;Understanding of distribution of calorie intake throughout the day with the consumption of 4-5 meals/snacks    Diabetes --   pre   Hypertension Yes    Intervention Provide education on lifestyle modifcations including regular physical activity/exercise, weight management, moderate sodium restriction and increased consumption of fresh fruit, vegetables, and low fat dairy, alcohol moderation, and smoking cessation.;Monitor prescription use compliance.    Expected Outcomes Short Term: Continued assessment and intervention until BP is < 140/44mm HG in hypertensive participants. < 130/61mm HG in hypertensive participants with diabetes, heart failure or chronic kidney disease.;Long Term: Maintenance of blood pressure at goal levels.    Lipids Yes    Intervention Provide education and support for participant on nutrition & aerobic/resistive exercise along with prescribed medications to achieve LDL 70mg , HDL >40mg .    Expected Outcomes Short Term: Participant states understanding of desired cholesterol values and is compliant with medications prescribed. Participant is following exercise prescription and nutrition guidelines.;Long Term: Cholesterol controlled with medications as prescribed, with individualized exercise RX and with personalized nutrition plan. Value goals: LDL < 70mg ,  HDL > 40 mg.  Education:Diabetes - Individual verbal and written instruction to review signs/symptoms of diabetes, desired ranges of glucose level fasting, after meals and with exercise. Acknowledge that pre and post exercise glucose checks will be done for 3 sessions at entry of program.   Core Components/Risk Factors/Patient Goals Review:   Goals and Risk Factor Review     Row Name 09/07/24 0748 09/07/24 0753           Core Components/Risk Factors/Patient Goals Review   Personal Goals Review Weight Management/Obesity;Hypertension;Lipids Weight Management/Obesity;Hypertension;Lipids      Review -- Marc reports that he does continue work towards a weight goal of 200 lbs, but has gained some weight over the holidays. He does take all his BP meds and cholesterol meds. He does not check his BP at home but does have a cuff. He was encouraged to start checking BP at home several days a week of get into the habit of doing so. He follows up with his doctor for routine lab work.      Expected Outcomes -- Short: start checking BP at home. Long: control cardiac risk factor.         Core Components/Risk Factors/Patient Goals at Discharge (Final Review):   Goals and Risk Factor Review - 09/07/24 0753       Core Components/Risk Factors/Patient Goals Review   Personal Goals Review Weight Management/Obesity;Hypertension;Lipids    Review Marc reports that he does continue work towards a weight goal of 200 lbs, but has gained some weight over the holidays. He does take all his BP meds and cholesterol meds. He does not check his BP at home but does have a cuff. He was encouraged to start checking BP at home several days a week of get into the habit of doing so. He follows up with his doctor for routine lab work.    Expected Outcomes Short: start checking BP at home. Long: control cardiac risk factor.          ITP Comments:  ITP Comments     Row Name 07/27/24 1054 08/05/24 0743  08/19/24 1338 09/16/24 1123     ITP Comments Completed program orientation and . Initial ITP created and sent for review to Medical Director. First full day of exercise!  Patient was oriented to gym and equipment including functions, settings, policies, and procedures.  Patient's individual exercise prescription and treatment plan were reviewed.  All starting workloads were established based on the results of the 6 minute walk test done at initial orientation visit.  The plan for exercise progression was also introduced and progression will be customized based on patient's performance and goals. 30 Day review completed. Medical Director ITP review done, changes made as directed, and signed approval by Medical Director. New to program 30 Day review completed. Medical Director ITP review done, changes made as directed, and signed approval by Medical Director.       Comments: 30 day review     [1]  Current Outpatient Medications:    amiodarone  (PACERONE ) 200 MG tablet, Taking 1 tablet by mouth once daily., Disp: 90 tablet, Rfl: 0   aspirin  EC 81 MG tablet, Take 1 tablet (81 mg total) by mouth daily. Swallow whole., Disp: , Rfl:    clopidogrel  (PLAVIX ) 75 MG tablet, Take 1 tablet (75 mg total) by mouth daily., Disp: 30 tablet, Rfl: 1   ezetimibe  (ZETIA ) 10 MG tablet, Take 1 tablet (10 mg total) by mouth daily., Disp: 90 tablet, Rfl: 1   losartan  (COZAAR ) 25  MG tablet, Take 1 tablet (25 mg total) by mouth at bedtime., Disp: 100 tablet, Rfl: 0   metoprolol  tartrate (LOPRESSOR ) 25 MG tablet, Take 0.5 tablets (12.5 mg total) by mouth 2 (two) times daily., Disp: 90 tablet, Rfl: 3   rosuvastatin  (CRESTOR ) 10 MG tablet, Take 1 tablet (10 mg total) by mouth daily., Disp: 90 tablet, Rfl: 3   triamcinolone  cream (KENALOG ) 0.1 %, Apply 1 Application topically 2 (two) times daily. To rash (Patient taking differently: Apply 1 Application topically 2 (two) times daily as needed (rash/itching).), Disp: 30 g,  Rfl: 0 [2]  Social History Tobacco Use  Smoking Status Former   Types: Cigarettes  Smokeless Tobacco Never  Tobacco Comments   light

## 2024-09-16 NOTE — Progress Notes (Signed)
 Daily Session Note  Patient Details  Name: Marc Myers MRN: [id number] Date of Birth: [birth year] Referring Provider:   Flowsheet Row Cardiac Rehab from 07/27/2024 in [facility] Cardiac and Pulmonary Rehab  Referring Provider Dr. Evalene Lunger, MD    Encounter Date: 09/16/2024  Check In:  Session Check In - 09/16/24 0738       Check-In   Supervising physician immediately available to respond to emergencies See telemetry face sheet for immediately available ER MD    Location [facility]-Cardiac & Pulmonary Rehab    Staff Present Burnard Davenport RN,BSN,MPA;Maxon Conetta BS, Exercise Physiologist;Joseph Rolinda NORWOOD HARMAN Hobert Best, MS, Exercise Physiologist    Virtual Visit No    Medication changes reported     No    Fall or balance concerns reported    No    Warm-up and Cool-down Performed on first and last piece of equipment    Resistance Training Performed Yes    VAD Patient? No    PAD/SET Patient? No      Pain Assessment   Currently in Pain? No/denies             Tobacco Use History[1]  Goals Met:  Independence with exercise equipment Exercise tolerated well No report of concerns or symptoms today Strength training completed today  Goals Unmet:  Not Applicable  Comments: Pt able to follow exercise prescription today without complaint.  Will continue to monitor for progression.    Dr. Oneil Pinal is Medical Director for [facility] Cardiac Rehabilitation.  Dr. Fuad Aleskerov is Medical Director for [facility] Pulmonary Rehabilitation.    [1]  Social History Tobacco Use  Smoking Status Former   Types: Cigarettes  Smokeless Tobacco Never  Tobacco Comments   light

## 2024-09-18 ENCOUNTER — Encounter: Payer: Self-pay | Admitting: Student

## 2024-09-18 ENCOUNTER — Ambulatory Visit: Payer: Self-pay | Attending: Student | Admitting: Student

## 2024-09-18 VITALS — BP 150/80 | HR 74 | Resp 18 | Ht 69.0 in | Wt 217.6 lb

## 2024-09-18 DIAGNOSIS — E785 Hyperlipidemia, unspecified: Secondary | ICD-10-CM

## 2024-09-18 DIAGNOSIS — I25810 Atherosclerosis of coronary artery bypass graft(s) without angina pectoris: Secondary | ICD-10-CM | POA: Diagnosis not present

## 2024-09-18 DIAGNOSIS — Z951 Presence of aortocoronary bypass graft: Secondary | ICD-10-CM

## 2024-09-18 DIAGNOSIS — Z79899 Other long term (current) drug therapy: Secondary | ICD-10-CM

## 2024-09-18 DIAGNOSIS — I10 Essential (primary) hypertension: Secondary | ICD-10-CM | POA: Diagnosis not present

## 2024-09-18 NOTE — Patient Instructions (Addendum)
 Medication Instructions:   Your physician recommends the following medication changes.  STOP TAKING: Amiodarone   *If you need a refill on your cardiac medications before your next appointment, please call your pharmacy*  Lab Work:  Your provider would like for you to have following labs drawn today CBC, CMet, Lipid Panel.    If you have labs (blood work) drawn today and your tests are completely normal, you will receive your results only by:  MyChart Message (if you have MyChart) OR  A paper copy in the mail If you have any lab test that is abnormal or we need to change your treatment, we will call you to review the results.  Testing/Procedures:  None ordered at this time   Referrals:  None ordered at this time   Follow-Up:  At [facility], you and your health needs are our priority.  As part of our continuing mission to provide you with exceptional heart care, our providers are all part of one team.  This team includes your primary Cardiologist (physician) and Advanced Practice Providers or APPs (Physician Assistants and Nurse Practitioners) who all work together to provide you with the care you need, when you need it.  Your next appointment:   5 - 6 month(s)  Provider:    Evalene Lunger, MD or Barnie Hila, NP    We recommend signing up for the patient portal called MyChart.  Sign up information is provided on this After Visit Summary.  MyChart is used to connect with patients for Virtual Visits (Telemedicine).  Patients are able to view lab/test results, encounter notes, upcoming appointments, etc.  Non-urgent messages can be sent to your provider as well.   To learn more about what you can do with MyChart, go to [email or web address].   Other Instructions  Avoid alcohol, caffeine, and exercise within 30 minutes before checking blood pressure. Sit still, with feet flat on the floor, in a straight backed, supportive chair.  Rest for 5 minutes.   Support arm on flat surface at heart level. Bottom of cuff should be placed directly above the bend of the elbow. Take multiple readings.  Write down and bring to all follow up appointments.  Bring cuff to clinic periodically for accuracy comparison.   Send your BP numbers in every couple of weeks.  Please call us  if your start averaging BP numbers above 130/80.

## 2024-09-19 LAB — LIPID PANEL
Chol/HDL Ratio: 3.2 ratio (ref 0.0–5.0)
Cholesterol, Total: 149 mg/dL (ref 100–199)
HDL: 47 mg/dL
LDL Chol Calc (NIH): 80 mg/dL (ref 0–99)
Triglycerides: 123 mg/dL (ref 0–149)
VLDL Cholesterol Cal: 22 mg/dL (ref 5–40)

## 2024-09-19 LAB — CBC
Hematocrit: 49.8 % (ref 37.5–51.0)
Hemoglobin: 15.1 g/dL (ref 13.0–17.7)
MCH: 28.8 pg (ref 26.6–33.0)
MCHC: 30.3 g/dL — ABNORMAL LOW (ref 31.5–35.7)
MCV: 95 fL (ref 79–97)
Platelets: 276 x10E3/uL (ref 150–450)
RBC: 5.25 x10E6/uL (ref 4.14–5.80)
RDW: 12.6 % (ref 11.6–15.4)
WBC: 5.8 x10E3/uL (ref 3.4–10.8)

## 2024-09-19 LAB — COMPREHENSIVE METABOLIC PANEL WITH GFR
ALT: 35 IU/L (ref 0–44)
AST: 22 IU/L (ref 0–40)
Albumin: 4.3 g/dL (ref 3.9–4.9)
Alkaline Phosphatase: 77 IU/L (ref 47–123)
BUN/Creatinine Ratio: 18 (ref 10–24)
BUN: 25 mg/dL (ref 8–27)
Bilirubin Total: 0.3 mg/dL (ref 0.0–1.2)
CO2: 22 mmol/L (ref 20–29)
Calcium: 9.4 mg/dL (ref 8.6–10.2)
Chloride: 104 mmol/L (ref 96–106)
Creatinine, Ser: 1.38 mg/dL — ABNORMAL HIGH (ref 0.76–1.27)
Globulin, Total: 2.6 g/dL (ref 1.5–4.5)
Glucose: 110 mg/dL — ABNORMAL HIGH (ref 70–99)
Potassium: 4.7 mmol/L (ref 3.5–5.2)
Sodium: 139 mmol/L (ref 134–144)
Total Protein: 6.9 g/dL (ref 6.0–8.5)
eGFR: 56 mL/min/1.73 — ABNORMAL LOW

## 2024-09-21 ENCOUNTER — Ambulatory Visit: Payer: Self-pay | Admitting: Student

## 2024-09-21 DIAGNOSIS — Z48812 Encounter for surgical aftercare following surgery on the circulatory system: Secondary | ICD-10-CM | POA: Diagnosis not present

## 2024-09-21 DIAGNOSIS — Z951 Presence of aortocoronary bypass graft: Secondary | ICD-10-CM

## 2024-09-21 DIAGNOSIS — Z79899 Other long term (current) drug therapy: Secondary | ICD-10-CM

## 2024-09-21 NOTE — Progress Notes (Signed)
 Daily Session Note  Patient Details  Name: Marc Myers MRN: [id number] Date of Birth: [birth year] Referring Provider:   Flowsheet Row Cardiac Rehab from 07/27/2024 in [facility] Cardiac and Pulmonary Rehab  Referring Provider Dr. Evalene Lunger, MD    Encounter Date: 09/21/2024  Check In:  Session Check In - 09/21/24 0738       Check-In   Supervising physician immediately available to respond to emergencies See telemetry face sheet for immediately available ER MD    Location [facility]-Cardiac & Pulmonary Rehab    Staff Present Burnard Davenport RN,BSN,MPA;Joseph [facility] Dyane BS, ACSM CEP, Exercise Physiologist;Jason Elnor RDN,LDN    Virtual Visit No    Medication changes reported     No    Fall or balance concerns reported    No    Warm-up and Cool-down Performed on first and last piece of equipment    Resistance Training Performed Yes    VAD Patient? No    PAD/SET Patient? No      Pain Assessment   Currently in Pain? No/denies             Tobacco Use History[1]  Goals Met:  Independence with exercise equipment Exercise tolerated well No report of concerns or symptoms today Strength training completed today  Goals Unmet:  Not Applicable  Comments: Pt able to follow exercise prescription today without complaint.  Will continue to monitor for progression.    Dr. Oneil Pinal is Medical Director for [facility] Cardiac Rehabilitation.  Dr. Fuad Aleskerov is Medical Director for [facility] Pulmonary Rehabilitation.    [1]  Social History Tobacco Use  Smoking Status Former   Types: Cigarettes  Smokeless Tobacco Never  Tobacco Comments   light

## 2024-09-22 ENCOUNTER — Other Ambulatory Visit: Payer: Self-pay | Admitting: Cardiology

## 2024-09-22 MED ORDER — ROSUVASTATIN CALCIUM 20 MG PO TABS: 20.0000 mg | ORAL_TABLET | Freq: Every day | ORAL | 3 refills | Status: AC

## 2024-09-22 NOTE — Telephone Encounter (Signed)
 Called and spoke with Darice, patient's wife, per DPR.  Informed her of the most recent lab results as interpreted by Barnie Hila, NP as well as the recommendations to increase hydration and increase Crestor  to 20 mg daily.  Also informed of the need to repeat BMP in about 3 weeks and repeat Lipid Panel and Hepatic Function Panel in about 2 months, the latter two being fasting blood work.  Darice verbalized understanding and agreement with the treatment plan with all questions and concerns addressed at this time.  Orders for the blood work placed and prescription for Crestor  20 mg daily sent to pharmacy.

## 2024-09-22 NOTE — Telephone Encounter (Signed)
-----   Message from Barnie Hila, NP sent at 09/21/2024 11:11 AM EST ----- Please let patient know blood counts are normal. Kidney function is a little low. Increase hydration. I would like to recheck BMP in 3 weeks. LDL (bad cholesterol) is 80 which is not at goal of less  than at least 70. Would like to increase Crestor  to 20 mg daily. Recheck lipid panel and LFTs in 2 months.   Thank you!  DW

## 2024-09-23 DIAGNOSIS — Z951 Presence of aortocoronary bypass graft: Secondary | ICD-10-CM

## 2024-09-23 DIAGNOSIS — Z48812 Encounter for surgical aftercare following surgery on the circulatory system: Secondary | ICD-10-CM | POA: Diagnosis not present

## 2024-09-23 NOTE — Progress Notes (Signed)
 Daily Session Note  Patient Details  Name: Marc Myers MRN: [id number] Date of Birth: May 14, 1956 Referring Provider:   Flowsheet Row Cardiac Rehab from 07/27/2024 in [facility] Cardiac and Pulmonary Rehab  Referring Provider Dr. Evalene Lunger, MD    Encounter Date: 09/23/2024  Check In:  Session Check In - 09/23/24 0721       Check-In   Supervising physician immediately available to respond to emergencies See telemetry face sheet for immediately available ER MD    Location [facility]-Cardiac & Pulmonary Rehab    Staff Present Burnard Davenport RN,BSN,MPA;Maxon Conetta BS, Exercise Physiologist;Margaret Best, MS, Exercise Physiologist;Joseph Rolinda RCP,RRT,BSRT    Virtual Visit No    Medication changes reported     No    Fall or balance concerns reported    No    Warm-up and Cool-down Performed on first and last piece of equipment    Resistance Training Performed Yes    VAD Patient? No    PAD/SET Patient? No      Pain Assessment   Currently in Pain? No/denies             Tobacco Use History[1]  Goals Met:  Independence with exercise equipment Exercise tolerated well No report of concerns or symptoms today Strength training completed today  Goals Unmet:  Not Applicable  Comments: Pt able to follow exercise prescription today without complaint.  Will continue to monitor for progression.    Dr. Oneil Pinal is Medical Director for [facility] Cardiac Rehabilitation.  Dr. Fuad Aleskerov is Medical Director for [facility] Pulmonary Rehabilitation.    [1]  Social History Tobacco Use  Smoking Status Former   Types: Cigarettes  Smokeless Tobacco Never  Tobacco Comments   light

## 2024-09-25 DIAGNOSIS — Z48812 Encounter for surgical aftercare following surgery on the circulatory system: Secondary | ICD-10-CM | POA: Diagnosis not present

## 2024-09-25 DIAGNOSIS — Z951 Presence of aortocoronary bypass graft: Secondary | ICD-10-CM

## 2024-09-25 NOTE — Progress Notes (Signed)
 Daily Session Note  Patient Details  Name: Marc Myers MRN: [id number] Date of Birth: [birth year] Referring Provider:   Flowsheet Row Cardiac Rehab from 07/27/2024 in [facility] Cardiac and Pulmonary Rehab  Referring Provider Dr. Evalene Lunger, MD    Encounter Date: 09/25/2024  Check In:  Session Check In - 09/25/24 0724       Check-In   Supervising physician immediately available to respond to emergencies See telemetry face sheet for immediately available ER MD    Location [facility]-Cardiac & Pulmonary Rehab    Staff Present Burnard Davenport RN,BSN,MPA;Joseph Rolinda RCP,RRT,BSRT;Noah Tickle, [place], Exercise Physiologist    Virtual Visit No    Medication changes reported     No    Fall or balance concerns reported    No    Warm-up and Cool-down Performed on first and last piece of equipment    Resistance Training Performed Yes    VAD Patient? No    PAD/SET Patient? No      Pain Assessment   Currently in Pain? No/denies             Tobacco Use History[1]  Goals Met:  Independence with exercise equipment Exercise tolerated well No report of concerns or symptoms today Strength training completed today  Goals Unmet:  Not Applicable  Comments: Pt able to follow exercise prescription today without complaint.  Will continue to monitor for progression.    Dr. Oneil Pinal is Medical Director for [facility] Cardiac Rehabilitation.  Dr. Fuad Aleskerov is Medical Director for [facility] Pulmonary Rehabilitation.    [1]  Social History Tobacco Use  Smoking Status Former   Types: Cigarettes  Smokeless Tobacco Never  Tobacco Comments   light

## 2024-10-02 DIAGNOSIS — Z951 Presence of aortocoronary bypass graft: Secondary | ICD-10-CM

## 2024-10-02 DIAGNOSIS — Z48812 Encounter for surgical aftercare following surgery on the circulatory system: Secondary | ICD-10-CM | POA: Diagnosis not present

## 2024-10-02 NOTE — Progress Notes (Signed)
 Daily Session Note  Patient Details  Name: Marc Myers MRN: [id number] Date of Birth: 04-22-1956 Referring Provider:   Flowsheet Row Cardiac Rehab from 07/27/2024 in [facility] Cardiac and Pulmonary Rehab  Referring Provider Dr. Evalene Lunger, MD    Encounter Date: 10/02/2024  Check In:  Session Check In - 10/02/24 0729       Check-In   Supervising physician immediately available to respond to emergencies See telemetry face sheet for immediately available ER MD    Location [facility]-Cardiac & Pulmonary Rehab    Staff Present Burnard Davenport RN,BSN,MPA;Joseph Rolinda RCP,RRT,BSRT;Laura Cates RN,BSN    Virtual Visit No    Medication changes reported     No    Fall or balance concerns reported    No    Warm-up and Cool-down Performed on first and last piece of equipment    Resistance Training Performed Yes    VAD Patient? No    PAD/SET Patient? No      Pain Assessment   Currently in Pain? No/denies             Tobacco Use History[1]  Goals Met:  Independence with exercise equipment Exercise tolerated well No report of concerns or symptoms today Strength training completed today  Goals Unmet:  Not Applicable  Comments: Pt able to follow exercise prescription today without complaint.  Will continue to monitor for progression.    Dr. Oneil Pinal is Medical Director for [facility] Cardiac Rehabilitation.  Dr. Fuad Aleskerov is Medical Director for [facility] Pulmonary Rehabilitation.    [1]  Social History Tobacco Use  Smoking Status Former   Types: Cigarettes  Smokeless Tobacco Never  Tobacco Comments   light

## 2024-10-07 DIAGNOSIS — Z951 Presence of aortocoronary bypass graft: Secondary | ICD-10-CM

## 2024-10-07 NOTE — Progress Notes (Signed)
 Daily Session Note  Patient Details  Name: Marc Myers MRN: [id number] Date of Birth: 1956/07/01 Referring Provider:   Flowsheet Row Cardiac Rehab from 07/27/2024 in [facility] Card[facility] Rehab  Referring Provider Dr. Evalene Lunger, MD    Encounter Date: 10/07/2024  Check In:  Session Check In - 10/07/24 0736       Check-In   Supervising physician immediately available to respond to emergencies See telemetry face sheet for immediately available ER MD    Location [facility]-Cardiac & Pulmonary Rehab    Staff Present Burnard Davenport RN,BSN,MPA;Maxon Conetta BS, Exercise Physiologist;Joseph Rolinda NORWOOD HARMAN Hobert Best, MS, Exercise Physiologist    Virtual Visit No    Medication changes reported     No    Fall or balance concerns reported    No    Warm-up and Cool-down Performed on first and last piece of equipment    Resistance Training Performed Yes    VAD Patient? No    PAD/SET Patient? No      Pain Assessment   Currently in Pain? No/denies             Tobacco Use History[1]  Goals Met:  Independence with exercise equipment Exercise tolerated well No report of concerns or symptoms today Strength training completed today  Goals Unmet:  Not Applicable  Comments: Pt able to follow exercise prescription today without complaint.  Will continue to monitor for progression.    Dr. Oneil Pinal is Medical Director for [facility] Cardiac Rehabilitation.  Dr. Fuad Aleskerov is Medical Director for [facility] Pulmonary Rehabilitation.    [1]  Social History Tobacco Use  Smoking Status Former   Types: Cigarettes  Smokeless Tobacco Never  Tobacco Comments   light

## 2024-10-09 DIAGNOSIS — Z951 Presence of aortocoronary bypass graft: Secondary | ICD-10-CM

## 2024-10-09 NOTE — Progress Notes (Signed)
 Daily Session Note  Patient Details  Name: Octavious Zidek MRN: [id number] Date of Birth: 07/22/1956 Referring Provider:   Flowsheet Row Cardiac Rehab from 07/27/2024 in [facility] Cardiac and Pulmonary Rehab  Referring Provider Dr. Evalene Lunger, MD    Encounter Date: 10/09/2024  Check In:  Session Check In - 10/09/24 0731       Check-In   Supervising physician immediately available to respond to emergencies See telemetry face sheet for immediately available ER MD    Location [facility]-Cardiac & Pulmonary Rehab    Staff Present Burnard Davenport RN,BSN,MPA;Joseph Rolinda RCP,RRT,BSRT;Noah Tickle, [place], Exercise Physiologist    Virtual Visit No    Medication changes reported     No    Fall or balance concerns reported    No    Warm-up and Cool-down Performed on first and last piece of equipment    Resistance Training Performed Yes    VAD Patient? No    PAD/SET Patient? No      Pain Assessment   Currently in Pain? No/denies             Tobacco Use History[1]  Goals Met:  Independence with exercise equipment Exercise tolerated well No report of concerns or symptoms today Strength training completed today  Goals Unmet:  Not Applicable  Comments: Pt able to follow exercise prescription today without complaint.  Will continue to monitor for progression.    Dr. Oneil Pinal is Medical Director for [facility] Cardiac Rehabilitation.  Dr. Fuad Aleskerov is Medical Director for [facility] Pulmonary Rehabilitation.    [1]  Social History Tobacco Use  Smoking Status Former   Types: Cigarettes  Smokeless Tobacco Never  Tobacco Comments   light

## 2024-10-19 ENCOUNTER — Ambulatory Visit: Admitting: Internal Medicine

## 2025-02-25 ENCOUNTER — Ambulatory Visit: Admitting: Student

## 2025-08-24 ENCOUNTER — Ambulatory Visit
